# Patient Record
Sex: Male | Born: 1945 | Race: White | Hispanic: No | Smoking: Former smoker
Health system: Southern US, Community
[De-identification: ages and names within clinical notes are randomized; demographics above are authoritative.]

## PROBLEM LIST (undated history)

## (undated) DIAGNOSIS — N400 Enlarged prostate without lower urinary tract symptoms: Secondary | ICD-10-CM

## (undated) DIAGNOSIS — G4730 Sleep apnea, unspecified: Secondary | ICD-10-CM

## (undated) DIAGNOSIS — M549 Dorsalgia, unspecified: Secondary | ICD-10-CM

## (undated) DIAGNOSIS — M1990 Unspecified osteoarthritis, unspecified site: Secondary | ICD-10-CM

## (undated) DIAGNOSIS — I10 Essential (primary) hypertension: Secondary | ICD-10-CM

## (undated) DIAGNOSIS — D649 Anemia, unspecified: Secondary | ICD-10-CM

## (undated) DIAGNOSIS — E119 Type 2 diabetes mellitus without complications: Secondary | ICD-10-CM

## (undated) HISTORY — PX: ROTATOR CUFF REPAIR: SHX139

## (undated) HISTORY — PX: APPENDECTOMY: SHX54

## (undated) HISTORY — PX: BACK SURGERY: SHX140

---

## 2000-10-06 ENCOUNTER — Encounter: Admission: RE | Admit: 2000-10-06 | Discharge: 2001-01-04 | Payer: Self-pay | Admitting: Family Medicine

## 2005-07-22 ENCOUNTER — Encounter: Admission: RE | Admit: 2005-07-22 | Discharge: 2005-07-22 | Payer: Self-pay | Admitting: Otolaryngology

## 2005-07-28 ENCOUNTER — Ambulatory Visit (HOSPITAL_BASED_OUTPATIENT_CLINIC_OR_DEPARTMENT_OTHER): Admission: RE | Admit: 2005-07-28 | Discharge: 2005-07-28 | Payer: Self-pay | Admitting: Otolaryngology

## 2006-04-20 HISTORY — PX: REFRACTIVE SURGERY: SHX103

## 2010-06-06 ENCOUNTER — Ambulatory Visit (HOSPITAL_BASED_OUTPATIENT_CLINIC_OR_DEPARTMENT_OTHER)
Admission: RE | Admit: 2010-06-06 | Discharge: 2010-06-06 | Disposition: A | Discharge status: Home / Self Care | Payer: Medicare Other | Source: Ambulatory Visit | Attending: Orthopedic Surgery | Admitting: Orthopedic Surgery

## 2010-06-06 DIAGNOSIS — M65839 Other synovitis and tenosynovitis, unspecified forearm: Secondary | ICD-10-CM | POA: Insufficient documentation

## 2010-06-10 NOTE — Op Note (Signed)
NAMECLAYBURN, WEEKLY                ACCOUNT NO.:  [id number]  MEDICAL RECORD NO.:  [id number]          PATIENT TYPE:  LOCATION:                                 FACILITY:  PHYSICIAN:  Katy Fitch. Keyan Folson, M.D.      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  OPERATION:  Release of left long finger A1 pulley.  OPERATING SURGEON:  Katy Fitch. Kellianne Ek, MD  ASSISTANT:  Marveen Reeks Dasnoit, PA  ANESTHESIA:  Lidocaine 2% metacarpal head level block and flexor sheath block left long finger.  This was a minor operating room procedure.  INDICATIONS:  Gregory Estrada is a 65 year old gentleman referred through the courtesy of Dr. Arbie Cookey at [facility] for evaluation and management of a chronic locking left long finger.  He had failed prior nonoperative measures.  We discussed a second steroid injection versus release of the A1 pulley.  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  After informed consent, he is brought to the operating room at this time.  PROCEDURE:  Sarp Vernier was brought to room 4 of the [facility] and placed in the supine position upon the operating table. Following Betadine prep of his left hand, 2% lidocaine was infiltrated as a metacarpal head level to obtain a digital block and flexor sheath block.  After few minutes, excellent anesthesia was achieved.  Left arm was then prepped with Betadine soap and solution and sterilely draped. A routine surgical time-out was accomplished followed by exsanguination of the left arm with direct compression followed by  inflation of the arterial tourniquet on the proximal brachium to 250 mmHg.  When anesthesia was confirmed to be satisfactory, we proceeded with an oblique incision directly over the left long finger A1 pulley. Subcutaneous tissues were carefully divided taking care to identify the pulley.  Ragnell retractors were placed to protect the neurovascular structures.  The A1 pulley was split with scalpel and scissors.  After this was fully released, Mr. Bair demonstrated full active range of motion of his finger in flexion and extension.  The wound was then repaired with mattress suture of 5-0 nylon.  Compressive dressing was applied with Xeroflo, sterile gauze, and Ace wrap.  Mr. Parmer will remove his dressing and apply a Band-Aid in 3 days.  We will see him back for follow up in 7 days for suture removal.  Questions invited and answered in detail.  For aftercare, he was provided a prescription for Vicodin 5 mg one p.o. q.4-6 h. p.r.n. pain, 16 tablets without refill.  He will use Aleve or Advil for minor discomfort.     Katy Fitch Dima Mini, M.D.     RVS/MEDQ  D:  06/06/2010  T:  06/07/2010  Job:  [id number]  cc:   Lorin Picket  Gurley.  Electronically Signed by Josephine Igo M.D. on 06/10/2010 08:36:05 AM

## 2010-08-07 ENCOUNTER — Other Ambulatory Visit: Payer: Self-pay | Admitting: Family Medicine

## 2010-08-07 DIAGNOSIS — G459 Transient cerebral ischemic attack, unspecified: Secondary | ICD-10-CM

## 2010-08-07 DIAGNOSIS — G44009 Cluster headache syndrome, unspecified, not intractable: Secondary | ICD-10-CM

## 2010-08-07 DIAGNOSIS — R5383 Other fatigue: Secondary | ICD-10-CM

## 2010-08-07 DIAGNOSIS — R6889 Other general symptoms and signs: Secondary | ICD-10-CM

## 2010-08-14 ENCOUNTER — Ambulatory Visit
Admission: RE | Admit: 2010-08-14 | Discharge: 2010-08-14 | Disposition: A | Discharge status: Home / Self Care | Payer: Medicare Other | Source: Ambulatory Visit | Attending: Family Medicine | Admitting: Family Medicine

## 2010-08-14 ENCOUNTER — Other Ambulatory Visit: Payer: Self-pay | Admitting: Family Medicine

## 2010-08-14 DIAGNOSIS — R5383 Other fatigue: Secondary | ICD-10-CM

## 2010-08-14 DIAGNOSIS — G459 Transient cerebral ischemic attack, unspecified: Secondary | ICD-10-CM

## 2010-08-14 DIAGNOSIS — G44009 Cluster headache syndrome, unspecified, not intractable: Secondary | ICD-10-CM

## 2010-08-14 DIAGNOSIS — R6889 Other general symptoms and signs: Secondary | ICD-10-CM

## 2010-08-14 MED ORDER — GADOBENATE DIMEGLUMINE 529 MG/ML IV SOLN
20.0000 mL | Freq: Once | INTRAVENOUS | Status: AC | PRN
  Administered 2010-08-14: 20 mL via INTRAVENOUS

## 2011-05-21 DIAGNOSIS — H524 Presbyopia: Secondary | ICD-10-CM | POA: Diagnosis not present

## 2011-05-22 DIAGNOSIS — M1710 Unilateral primary osteoarthritis, unspecified knee: Secondary | ICD-10-CM | POA: Diagnosis not present

## 2011-08-05 DIAGNOSIS — R635 Abnormal weight gain: Secondary | ICD-10-CM | POA: Diagnosis not present

## 2011-08-05 DIAGNOSIS — N400 Enlarged prostate without lower urinary tract symptoms: Secondary | ICD-10-CM | POA: Diagnosis not present

## 2011-08-05 DIAGNOSIS — R5383 Other fatigue: Secondary | ICD-10-CM | POA: Diagnosis not present

## 2011-08-05 DIAGNOSIS — I10 Essential (primary) hypertension: Secondary | ICD-10-CM | POA: Diagnosis not present

## 2011-08-05 DIAGNOSIS — M1990 Unspecified osteoarthritis, unspecified site: Secondary | ICD-10-CM | POA: Diagnosis not present

## 2011-08-05 DIAGNOSIS — E119 Type 2 diabetes mellitus without complications: Secondary | ICD-10-CM | POA: Diagnosis not present

## 2011-08-05 DIAGNOSIS — E782 Mixed hyperlipidemia: Secondary | ICD-10-CM | POA: Diagnosis not present

## 2011-08-05 DIAGNOSIS — E538 Deficiency of other specified B group vitamins: Secondary | ICD-10-CM | POA: Diagnosis not present

## 2011-09-11 DIAGNOSIS — H6690 Otitis media, unspecified, unspecified ear: Secondary | ICD-10-CM | POA: Diagnosis not present

## 2011-09-11 DIAGNOSIS — H109 Unspecified conjunctivitis: Secondary | ICD-10-CM | POA: Diagnosis not present

## 2011-09-11 DIAGNOSIS — R21 Rash and other nonspecific skin eruption: Secondary | ICD-10-CM | POA: Diagnosis not present

## 2011-09-11 DIAGNOSIS — M545 Low back pain: Secondary | ICD-10-CM | POA: Diagnosis not present

## 2011-09-11 DIAGNOSIS — J069 Acute upper respiratory infection, unspecified: Secondary | ICD-10-CM | POA: Diagnosis not present

## 2011-09-30 ENCOUNTER — Ambulatory Visit
Admission: RE | Admit: 2011-09-30 | Discharge: 2011-09-30 | Disposition: A | Discharge status: Home / Self Care | Payer: Medicare Other | Source: Ambulatory Visit | Attending: Physician Assistant | Admitting: Physician Assistant

## 2011-09-30 ENCOUNTER — Other Ambulatory Visit: Payer: Self-pay | Admitting: Physician Assistant

## 2011-09-30 DIAGNOSIS — M5137 Other intervertebral disc degeneration, lumbosacral region: Secondary | ICD-10-CM | POA: Diagnosis not present

## 2011-09-30 DIAGNOSIS — M545 Low back pain: Secondary | ICD-10-CM | POA: Diagnosis not present

## 2011-09-30 DIAGNOSIS — M47817 Spondylosis without myelopathy or radiculopathy, lumbosacral region: Secondary | ICD-10-CM | POA: Diagnosis not present

## 2011-09-30 DIAGNOSIS — J309 Allergic rhinitis, unspecified: Secondary | ICD-10-CM | POA: Diagnosis not present

## 2011-10-07 DIAGNOSIS — M545 Low back pain: Secondary | ICD-10-CM | POA: Diagnosis not present

## 2011-10-14 DIAGNOSIS — M545 Low back pain: Secondary | ICD-10-CM | POA: Diagnosis not present

## 2011-10-21 ENCOUNTER — Other Ambulatory Visit: Payer: Self-pay | Admitting: Neurosurgery

## 2011-10-21 DIAGNOSIS — M47817 Spondylosis without myelopathy or radiculopathy, lumbosacral region: Secondary | ICD-10-CM | POA: Diagnosis not present

## 2011-10-21 DIAGNOSIS — M48061 Spinal stenosis, lumbar region without neurogenic claudication: Secondary | ICD-10-CM | POA: Diagnosis not present

## 2011-10-25 ENCOUNTER — Ambulatory Visit
Admission: RE | Admit: 2011-10-25 | Discharge: 2011-10-25 | Disposition: A | Discharge status: Home / Self Care | Payer: Medicare Other | Source: Ambulatory Visit | Attending: Neurosurgery | Admitting: Neurosurgery

## 2011-10-25 DIAGNOSIS — M47817 Spondylosis without myelopathy or radiculopathy, lumbosacral region: Secondary | ICD-10-CM | POA: Diagnosis not present

## 2011-10-25 DIAGNOSIS — M5137 Other intervertebral disc degeneration, lumbosacral region: Secondary | ICD-10-CM | POA: Diagnosis not present

## 2011-10-25 DIAGNOSIS — M5126 Other intervertebral disc displacement, lumbar region: Secondary | ICD-10-CM | POA: Diagnosis not present

## 2011-11-12 DIAGNOSIS — M47817 Spondylosis without myelopathy or radiculopathy, lumbosacral region: Secondary | ICD-10-CM | POA: Diagnosis not present

## 2011-12-09 DIAGNOSIS — M1710 Unilateral primary osteoarthritis, unspecified knee: Secondary | ICD-10-CM | POA: Diagnosis not present

## 2011-12-10 DIAGNOSIS — M47817 Spondylosis without myelopathy or radiculopathy, lumbosacral region: Secondary | ICD-10-CM | POA: Diagnosis not present

## 2011-12-16 DIAGNOSIS — M1710 Unilateral primary osteoarthritis, unspecified knee: Secondary | ICD-10-CM | POA: Diagnosis not present

## 2011-12-23 DIAGNOSIS — M1710 Unilateral primary osteoarthritis, unspecified knee: Secondary | ICD-10-CM | POA: Diagnosis not present

## 2011-12-24 DIAGNOSIS — M1710 Unilateral primary osteoarthritis, unspecified knee: Secondary | ICD-10-CM | POA: Diagnosis not present

## 2011-12-31 DIAGNOSIS — IMO0002 Reserved for concepts with insufficient information to code with codable children: Secondary | ICD-10-CM | POA: Diagnosis not present

## 2011-12-31 DIAGNOSIS — M25559 Pain in unspecified hip: Secondary | ICD-10-CM | POA: Diagnosis not present

## 2011-12-31 DIAGNOSIS — M48061 Spinal stenosis, lumbar region without neurogenic claudication: Secondary | ICD-10-CM | POA: Diagnosis not present

## 2012-01-06 DIAGNOSIS — M48061 Spinal stenosis, lumbar region without neurogenic claudication: Secondary | ICD-10-CM | POA: Diagnosis not present

## 2012-01-06 DIAGNOSIS — IMO0002 Reserved for concepts with insufficient information to code with codable children: Secondary | ICD-10-CM | POA: Diagnosis not present

## 2012-01-20 DIAGNOSIS — M48061 Spinal stenosis, lumbar region without neurogenic claudication: Secondary | ICD-10-CM | POA: Diagnosis not present

## 2012-01-20 DIAGNOSIS — IMO0002 Reserved for concepts with insufficient information to code with codable children: Secondary | ICD-10-CM | POA: Diagnosis not present

## 2012-01-21 DIAGNOSIS — M1710 Unilateral primary osteoarthritis, unspecified knee: Secondary | ICD-10-CM | POA: Diagnosis not present

## 2012-02-02 DIAGNOSIS — Z713 Dietary counseling and surveillance: Secondary | ICD-10-CM | POA: Diagnosis not present

## 2012-02-02 DIAGNOSIS — I10 Essential (primary) hypertension: Secondary | ICD-10-CM | POA: Diagnosis not present

## 2012-02-02 DIAGNOSIS — M1990 Unspecified osteoarthritis, unspecified site: Secondary | ICD-10-CM | POA: Diagnosis not present

## 2012-02-02 DIAGNOSIS — Z23 Encounter for immunization: Secondary | ICD-10-CM | POA: Diagnosis not present

## 2012-02-02 DIAGNOSIS — E782 Mixed hyperlipidemia: Secondary | ICD-10-CM | POA: Diagnosis not present

## 2012-02-02 DIAGNOSIS — M25569 Pain in unspecified knee: Secondary | ICD-10-CM | POA: Diagnosis not present

## 2012-02-02 DIAGNOSIS — M545 Low back pain: Secondary | ICD-10-CM | POA: Diagnosis not present

## 2012-02-04 DIAGNOSIS — M1710 Unilateral primary osteoarthritis, unspecified knee: Secondary | ICD-10-CM | POA: Diagnosis not present

## 2012-02-16 ENCOUNTER — Encounter (HOSPITAL_COMMUNITY): Payer: Self-pay | Admitting: Pharmacy Technician

## 2012-02-16 ENCOUNTER — Other Ambulatory Visit: Payer: Self-pay | Admitting: Orthopedic Surgery

## 2012-02-16 DIAGNOSIS — M1710 Unilateral primary osteoarthritis, unspecified knee: Secondary | ICD-10-CM | POA: Diagnosis not present

## 2012-02-16 MED ORDER — DEXAMETHASONE SODIUM PHOSPHATE 10 MG/ML IJ SOLN: 10.0000 mg | Freq: Once | INTRAMUSCULAR | Status: DC

## 2012-02-16 MED ORDER — BUPIVACAINE 0.25 % ON-Q PUMP SINGLE CATH 300ML: 300.0000 mL | INJECTION | Status: DC

## 2012-02-16 NOTE — Progress Notes (Signed)
Preoperative surgical orders have been place into the Epic hospital system for Gregory Estrada on 02/16/2012, 12:15 PM  by Patrica Duel for surgery on 02/29/12.  Preop Total Knee orders including Bupivacaine On-Q pump, IV Tylenol, and IV Decadron as long as there are no contraindications to the above medications. [name], PA-C

## 2012-02-23 ENCOUNTER — Ambulatory Visit (HOSPITAL_COMMUNITY)
Admission: RE | Admit: 2012-02-23 | Discharge: 2012-02-23 | Disposition: A | Discharge status: Home / Self Care | Payer: Medicare Other | Source: Ambulatory Visit | Attending: Orthopedic Surgery | Admitting: Orthopedic Surgery

## 2012-02-23 ENCOUNTER — Encounter (HOSPITAL_COMMUNITY)
Admission: RE | Admit: 2012-02-23 | Discharge: 2012-02-23 | Disposition: A | Discharge status: Home / Self Care | Payer: Medicare Other | Source: Ambulatory Visit | Attending: Orthopedic Surgery | Admitting: Orthopedic Surgery

## 2012-02-23 ENCOUNTER — Encounter (HOSPITAL_COMMUNITY): Payer: Self-pay

## 2012-02-23 DIAGNOSIS — I10 Essential (primary) hypertension: Secondary | ICD-10-CM | POA: Diagnosis not present

## 2012-02-23 DIAGNOSIS — Z01818 Encounter for other preprocedural examination: Secondary | ICD-10-CM | POA: Diagnosis not present

## 2012-02-23 DIAGNOSIS — E119 Type 2 diabetes mellitus without complications: Secondary | ICD-10-CM | POA: Insufficient documentation

## 2012-02-23 HISTORY — DX: Essential (primary) hypertension: I10

## 2012-02-23 HISTORY — DX: Benign prostatic hyperplasia without lower urinary tract symptoms: N40.0

## 2012-02-23 HISTORY — DX: Sleep apnea, unspecified: G47.30

## 2012-02-23 HISTORY — DX: Dorsalgia, unspecified: M54.9

## 2012-02-23 HISTORY — DX: Unspecified osteoarthritis, unspecified site: M19.90

## 2012-02-23 HISTORY — DX: Type 2 diabetes mellitus without complications: E11.9

## 2012-02-23 HISTORY — DX: Anemia, unspecified: D64.9

## 2012-02-23 LAB — CBC
HCT: 37.1 % — ABNORMAL LOW (ref 39.0–52.0)
Platelets: 149 10*3/uL — ABNORMAL LOW (ref 150–400)
RDW: 14.7 % (ref 11.5–15.5)
WBC: 6.1 10*3/uL (ref 4.0–10.5)

## 2012-02-23 LAB — URINALYSIS, ROUTINE W REFLEX MICROSCOPIC
Bilirubin Urine: NEGATIVE
Glucose, UA: NEGATIVE mg/dL
Hgb urine dipstick: NEGATIVE
Specific Gravity, Urine: 1.025 (ref 1.005–1.030)
pH: 5 (ref 5.0–8.0)

## 2012-02-23 LAB — COMPREHENSIVE METABOLIC PANEL
AST: 19 U/L (ref 0–37)
Albumin: 3.8 g/dL (ref 3.5–5.2)
Alkaline Phosphatase: 79 U/L (ref 39–117)
Chloride: 102 mEq/L (ref 96–112)
Potassium: 4.2 mEq/L (ref 3.5–5.1)
Total Bilirubin: 0.3 mg/dL (ref 0.3–1.2)

## 2012-02-23 LAB — SURGICAL PCR SCREEN
MRSA, PCR: NEGATIVE
Staphylococcus aureus: NEGATIVE

## 2012-02-23 LAB — APTT: aPTT: 30 seconds (ref 24–37)

## 2012-02-23 NOTE — Patient Instructions (Signed)
Gregory Estrada  02/23/2012                           YOUR PROCEDURE IS SCHEDULED ON:  02/29/12 AT 7:15 AM               PLEASE REPORT TO SHORT STAY CENTER AT :  5:15 AM               CALL THIS NUMBER IF ANY PROBLEMS THE DAY OF SURGERY :               [phone]                      REMEMBER:   Do not eat food or drink liquids AFTER MIDNIGHT   Take these medicines the morning of surgery with A SIP OF WATER: NONE   Do not wear jewelry, make-up   Do not wear lotions, powders, or perfumes.   Do not shave legs or underarms 12 hrs. before surgery (men may shave face)  Do not bring valuables to the hospital.  Contacts, dentures or bridgework may not be worn into surgery.  Leave suitcase in the car. After surgery it may be brought to your room.  For patients admitted to the hospital more than one night, checkout time is 11:00                          The day of discharge.   Patients discharged the day of surgery will not be allowed to drive home                             If going home same day of surgery, must have someone stay with you first                           24 hrs at home and arrange for some one to drive you home from hospital.    Special Instructions:   Please read over the following fact sheets that you were given:               1. MRSA  INFORMATION                      2. [place] PREPARING FOR SURGERY SHEET                3. INCENTIVE SPIROMETER               4. BRING C-PAP MASK AND TUBING TO HOSPITAL                                              X_____________________________________________________________________

## 2012-02-28 ENCOUNTER — Other Ambulatory Visit: Payer: Self-pay | Admitting: Orthopedic Surgery

## 2012-02-28 NOTE — H&P (Signed)
Gregory Estrada  DOB: 10/05/1945 Single / Language: English / Race: White Male  Date of Admission:  02/29/12  Chief Complaint:  Left Knee Pain  History of Present Illness The patient is a 66 year old male who comes in for a preoperative History and Physical. The patient is scheduled for a left total knee arthroplasty to be performed by Dr. Frank V. Aluisio, MD at [facility] on 02/29/12. The patient is a 66 year old male who presents today for follow up of their knee. The patient is being followed for their bilateral knee pain and osteoarthritis. Symptoms reported today include: pain and swelling. The patient feels that they are doing poorly and report their pain level to be moderate. Current treatment includes: icing. The following medication has been used for pain control: Tylenol.  He came in for aspirations due to a reaction to Synvisc. He continues to have problems with both knees.  The left knee is far more symptomatic than the right. Knees are now preventing him from doing what he desires. He'd like to be much more active but cannot be because of the knee pain. He is at a stage now where he feels as though something else needs to be done with the knees. He's had cortisone which has provided short-term benefit but not allowed him to do what he wants. He's also had the Visco supplements to which he had a reaction.  He is now ready to proceed with surgery. They have been treated conservatively in the past for the above stated problem and despite conservative measures, they continue to have progressive pain and severe functional limitations and dysfunction. They have failed non-operative management including home exercise, medications, and injections. It is felt that they would benefit from undergoing total joint replacement. Risks and benefits of the procedure have been discussed with the patient and they elect to proceed with surgery. There are no active contraindications  to surgery such as ongoing infection or rapidly progressive neurological disease.  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 02/28/2012 (Marked as Inactive) Flomax *GENITOURINARY AGENTS - MISCELLANEOUS*. Dizziness, Cough. Dry Mouth   Family History Diabetes Mellitus. mother Kidney disease. mother Rheumatoid Arthritis. brother Hypertension. mother   Social History Alcohol use. current drinker; drinks beer and hard liquor; only occasionally per week Copy of Drug/Alcohol Rehab (Previously). no Current work status. working full time Drug/Alcohol Rehab (Currently). no Exercise. Exercises weekly; does individual sport Illicit drug use. no Living situation. live alone Number of flights of stairs before winded. 2-3 Pain Contract. no Children. 2 Tobacco / smoke exposure. yes outdoors only Tobacco use. former smoker; smoke(d) 3/4 pack(s) per day Marital status. divorced Post-Surgical Plans. Plan is to go to SNF. Wants to look into [facility]. Advance Directives. Livig Will, Healthcare POA   Medication History Altace (5MG Capsule, Oral) Active. Janumet XR ( Oral) Specific dose unknown - Active. Lisinopril ( Oral) Specific dose unknown - Active.   Past Surgical History Rotator Cuff Repair - Right. Date: 2007. Back Surgery. Date: 2009. Laminectomy Appendectomy Finger Surgery  Past Medical History Anemia Sleep Apnea Gastroesophageal Reflux Disease Diabetes Mellitus, Type II Shingles Benign Prostatic Hypertrophy Obesity B12 Deficiency Hypertension Colonic Polyps   Review of Systems General:Not Present- Chills, Fever, Night Sweats, Fatigue, Weight Gain, Weight Loss and Memory Loss. Skin:Not Present- Hives, Itching, Rash, Eczema and Lesions. HEENT:Not Present- Tinnitus, Headache, Double Vision, Visual Loss, Hearing Loss and Dentures. Respiratory:Not Present- Shortness of    breath with exertion, Shortness of breath at rest, Allergies, Coughing up blood and Chronic Cough. Cardiovascular:Not Present- Chest Pain, Racing/skipping heartbeats, Difficulty Breathing Lying Down, Murmur, Swelling and Palpitations. Gastrointestinal:Not Present- Bloody Stool, Heartburn, Abdominal Pain, Vomiting, Nausea, Constipation, Diarrhea, Difficulty Swallowing, Jaundice and Loss of appetitie. Male Genitourinary:Not Present- Urinary frequency, Blood in Urine, Weak urinary stream, Discharge, Flank Pain, Incontinence, Painful Urination, Urgency, Urinary Retention and Urinating at Night. Musculoskeletal:Present- Joint Swelling and Joint Pain. Not Present- Muscle Weakness, Muscle Pain, Back Pain, Morning Stiffness and Spasms. Neurological:Not Present- Tremor, Dizziness, Blackout spells, Paralysis, Difficulty with balance and Weakness. Psychiatric:Not Present- Insomnia.   Vitals Weight: 308 lb Height: 72 in Body Surface Area: 2.66 m Body Mass Index: 41.77 kg/m Pulse: 72 (Regular) Resp.: 14 (Unlabored) BP: 154/66 (Sitting, Left Arm, Standard)    Physical Exam The physical exam findings are as follows:   General Mental Status - Alert, cooperative and good historian. General Appearance- pleasant. Not in acute distress. Orientation- Oriented X3. Build & Nutrition- Well nourished and Well developed.   Head and Neck Head- normocephalic, atraumatic . Neck Global Assessment- supple. no bruit auscultated on the right and no bruit auscultated on the left.   Eye Pupil- Bilateral- Regular and Round. Motion- Bilateral- EOMI. wears glasses  Chest and Lung Exam Auscultation: Breath sounds:- clear at anterior chest wall and - clear at posterior chest wall. Adventitious sounds:- No Adventitious sounds.   Cardiovascular Auscultation:Rhythm- Regular rate and rhythm. Heart Sounds- S1 WNL and S2 WNL. Murmurs & Other Heart Sounds: Murmur  1:Location- Aortic Area and Pulmonic Area. Timing- Early systolic. Grade- II/VI.   Abdomen Inspection:Contour- Generalized moderate distention. Palpation/Percussion:Tenderness- Abdomen is non-tender to palpation. Rigidity (guarding)- Abdomen is soft. Auscultation:Auscultation of the abdomen reveals - Bowel sounds normal.   Male Genitourinary Not done, not pertinent to present illness  Musculoskeletal On exam, well-developed male, alert and oriented, in no apparent distress. Evaluation of his knees shows no effusions. The left knee range is about 5-120. There is marked crepitus on ROM. He's tender medially greater than laterally with no instability. Right knee range is about 5-125. Slight crepitus on ROM. Minimal tenderness. No instability.  RADIOGRAPHS: AP of both knees and lateral show bone on bone changes in the medial and patellofemoral compartments of both knees, a little worse on the left than the right.  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 Aluisio.  Plan is to look into SNF - Wants to look into [facility].  PCP - Dr. Gurley - Patient has been seen preoperatively and felt to be stable for surgery. Recommend Hospitalists for DM management postop if needed.   Signed electronically by DREW L Zeshan Sena, PA-C  

## 2012-02-29 ENCOUNTER — Encounter (HOSPITAL_COMMUNITY): Payer: Self-pay | Admitting: *Deleted

## 2012-02-29 ENCOUNTER — Inpatient Hospital Stay (HOSPITAL_COMMUNITY)
Admission: RE | Admit: 2012-02-29 | Discharge: 2012-03-03 | DRG: 470 | Disposition: A | Discharge status: Skilled Nursing Facility | Payer: Medicare Other | Source: Ambulatory Visit | Attending: Orthopedic Surgery | Admitting: Orthopedic Surgery

## 2012-02-29 ENCOUNTER — Encounter (HOSPITAL_COMMUNITY)
Admission: RE | Disposition: A | Discharge status: Skilled Nursing Facility | Payer: Self-pay | Source: Ambulatory Visit | Attending: Orthopedic Surgery

## 2012-02-29 ENCOUNTER — Inpatient Hospital Stay (HOSPITAL_COMMUNITY): Payer: Medicare Other | Admitting: *Deleted

## 2012-02-29 DIAGNOSIS — M1710 Unilateral primary osteoarthritis, unspecified knee: Secondary | ICD-10-CM | POA: Diagnosis not present

## 2012-02-29 DIAGNOSIS — E871 Hypo-osmolality and hyponatremia: Secondary | ICD-10-CM

## 2012-02-29 DIAGNOSIS — Z5189 Encounter for other specified aftercare: Secondary | ICD-10-CM | POA: Diagnosis not present

## 2012-02-29 DIAGNOSIS — D62 Acute posthemorrhagic anemia: Secondary | ICD-10-CM

## 2012-02-29 DIAGNOSIS — G4733 Obstructive sleep apnea (adult) (pediatric): Secondary | ICD-10-CM | POA: Diagnosis not present

## 2012-02-29 DIAGNOSIS — I10 Essential (primary) hypertension: Secondary | ICD-10-CM | POA: Diagnosis present

## 2012-02-29 DIAGNOSIS — IMO0002 Reserved for concepts with insufficient information to code with codable children: Secondary | ICD-10-CM | POA: Diagnosis not present

## 2012-02-29 DIAGNOSIS — Z96659 Presence of unspecified artificial knee joint: Secondary | ICD-10-CM

## 2012-02-29 DIAGNOSIS — M179 Osteoarthritis of knee, unspecified: Secondary | ICD-10-CM

## 2012-02-29 DIAGNOSIS — Z6841 Body Mass Index (BMI) 40.0 and over, adult: Secondary | ICD-10-CM

## 2012-02-29 DIAGNOSIS — E669 Obesity, unspecified: Secondary | ICD-10-CM | POA: Diagnosis present

## 2012-02-29 DIAGNOSIS — E119 Type 2 diabetes mellitus without complications: Secondary | ICD-10-CM | POA: Diagnosis present

## 2012-02-29 DIAGNOSIS — M25569 Pain in unspecified knee: Secondary | ICD-10-CM | POA: Diagnosis not present

## 2012-02-29 DIAGNOSIS — G4730 Sleep apnea, unspecified: Secondary | ICD-10-CM | POA: Diagnosis present

## 2012-02-29 DIAGNOSIS — K5900 Constipation, unspecified: Secondary | ICD-10-CM | POA: Diagnosis not present

## 2012-02-29 HISTORY — PX: TOTAL KNEE ARTHROPLASTY: SHX125

## 2012-02-29 LAB — TYPE AND SCREEN: Antibody Screen: NEGATIVE

## 2012-02-29 LAB — GLUCOSE, CAPILLARY
Glucose-Capillary: 147 mg/dL — ABNORMAL HIGH (ref 70–99)
Glucose-Capillary: 182 mg/dL — ABNORMAL HIGH (ref 70–99)

## 2012-02-29 LAB — ABO/RH: ABO/RH(D): A POS

## 2012-02-29 SURGERY — ARTHROPLASTY, KNEE, TOTAL
Anesthesia: General | Site: Knee | Laterality: Left | Wound class: Clean

## 2012-02-29 MED ORDER — FENTANYL CITRATE 0.05 MG/ML IJ SOLN
INTRAMUSCULAR | Status: DC | PRN
  Administered 2012-02-29 (×2): 100 ug via INTRAVENOUS
  Administered 2012-02-29: 50 ug via INTRAVENOUS

## 2012-02-29 MED ORDER — ONDANSETRON HCL 4 MG PO TABS: 4.0000 mg | ORAL_TABLET | Freq: Four times a day (QID) | ORAL | Status: DC | PRN

## 2012-02-29 MED ORDER — CHLORHEXIDINE GLUCONATE 4 % EX LIQD
60.0000 mL | Freq: Once | CUTANEOUS | Status: DC
  Filled 2012-02-29: qty 60

## 2012-02-29 MED ORDER — HYDROMORPHONE HCL PF 1 MG/ML IJ SOLN
INTRAMUSCULAR | Status: DC | PRN
  Administered 2012-02-29: 1 mg via INTRAVENOUS
  Administered 2012-02-29 (×2): 0.5 mg via INTRAVENOUS

## 2012-02-29 MED ORDER — METHOCARBAMOL 100 MG/ML IJ SOLN
500.0000 mg | Freq: Four times a day (QID) | INTRAVENOUS | Status: DC | PRN
  Administered 2012-02-29: 500 mg via INTRAVENOUS
  Filled 2012-02-29: qty 5

## 2012-02-29 MED ORDER — MIDAZOLAM HCL 5 MG/5ML IJ SOLN
INTRAMUSCULAR | Status: DC | PRN
  Administered 2012-02-29: 2 mg via INTRAVENOUS

## 2012-02-29 MED ORDER — FLEET ENEMA 7-19 GM/118ML RE ENEM: 1.0000 | ENEMA | Freq: Once | RECTAL | Status: AC | PRN

## 2012-02-29 MED ORDER — RIVAROXABAN 10 MG PO TABS
10.0000 mg | ORAL_TABLET | Freq: Every day | ORAL | Status: DC
  Administered 2012-03-01 – 2012-03-03 (×3): 10 mg via ORAL
  Filled 2012-02-29 (×4): qty 1

## 2012-02-29 MED ORDER — PHENOL 1.4 % MT LIQD: 1.0000 | OROMUCOSAL | Status: DC | PRN

## 2012-02-29 MED ORDER — INSULIN ASPART 100 UNIT/ML ~~LOC~~ SOLN
0.0000 [IU] | Freq: Three times a day (TID) | SUBCUTANEOUS | Status: DC
  Administered 2012-02-29: 3 [IU] via SUBCUTANEOUS
  Administered 2012-03-01: 2 [IU] via SUBCUTANEOUS
  Administered 2012-03-01 (×2): 3 [IU] via SUBCUTANEOUS
  Administered 2012-03-02 – 2012-03-03 (×2): 2 [IU] via SUBCUTANEOUS

## 2012-02-29 MED ORDER — HYDROMORPHONE HCL PF 1 MG/ML IJ SOLN
0.2500 mg | INTRAMUSCULAR | Status: DC | PRN
  Administered 2012-02-29 (×2): 0.5 mg via INTRAVENOUS

## 2012-02-29 MED ORDER — METOCLOPRAMIDE HCL 10 MG PO TABS: 5.0000 mg | ORAL_TABLET | Freq: Three times a day (TID) | ORAL | Status: DC | PRN

## 2012-02-29 MED ORDER — LINAGLIPTIN 5 MG PO TABS
5.0000 mg | ORAL_TABLET | Freq: Every evening | ORAL | Status: DC
  Administered 2012-02-29 – 2012-03-02 (×3): 5 mg via ORAL
  Filled 2012-02-29 (×4): qty 1

## 2012-02-29 MED ORDER — DEXTROSE 5 % IV SOLN
3.0000 g | INTRAVENOUS | Status: AC
  Administered 2012-02-29: 3 g via INTRAVENOUS
  Filled 2012-02-29: qty 3000

## 2012-02-29 MED ORDER — SODIUM CHLORIDE 0.9 % IV SOLN: INTRAVENOUS | Status: DC

## 2012-02-29 MED ORDER — MENTHOL 3 MG MT LOZG: 1.0000 | LOZENGE | OROMUCOSAL | Status: DC | PRN

## 2012-02-29 MED ORDER — DEXAMETHASONE 6 MG PO TABS
10.0000 mg | ORAL_TABLET | Freq: Once | ORAL | Status: DC
  Filled 2012-02-29: qty 1

## 2012-02-29 MED ORDER — 0.9 % SODIUM CHLORIDE (POUR BTL) OPTIME
TOPICAL | Status: DC | PRN
  Administered 2012-02-29: 1000 mL

## 2012-02-29 MED ORDER — ONDANSETRON HCL 4 MG/2ML IJ SOLN
INTRAMUSCULAR | Status: DC | PRN
  Administered 2012-02-29: 4 mg via INTRAVENOUS

## 2012-02-29 MED ORDER — LACTATED RINGERS IV SOLN
INTRAVENOUS | Status: DC | PRN
  Administered 2012-02-29 (×2): via INTRAVENOUS

## 2012-02-29 MED ORDER — BUPIVACAINE ON-Q PAIN PUMP (FOR ORDER SET NO CHG)
INJECTION | Status: DC
  Filled 2012-02-29: qty 1

## 2012-02-29 MED ORDER — CEFAZOLIN SODIUM-DEXTROSE 2-3 GM-% IV SOLR
2.0000 g | Freq: Four times a day (QID) | INTRAVENOUS | Status: AC
  Administered 2012-02-29 (×2): 2 g via INTRAVENOUS
  Filled 2012-02-29 (×2): qty 50

## 2012-02-29 MED ORDER — ACETAMINOPHEN 10 MG/ML IV SOLN: 1000.0000 mg | Freq: Once | INTRAVENOUS | Status: DC

## 2012-02-29 MED ORDER — POLYETHYLENE GLYCOL 3350 17 G PO PACK: 17.0000 g | PACK | Freq: Every day | ORAL | Status: DC | PRN

## 2012-02-29 MED ORDER — SODIUM CHLORIDE 0.9 % IV SOLN
INTRAVENOUS | Status: DC
  Administered 2012-02-29: 1000 mL via INTRAVENOUS
  Administered 2012-02-29 – 2012-03-01 (×2): via INTRAVENOUS

## 2012-02-29 MED ORDER — METHOCARBAMOL 500 MG PO TABS
500.0000 mg | ORAL_TABLET | Freq: Four times a day (QID) | ORAL | Status: DC | PRN
  Administered 2012-02-29 – 2012-03-02 (×3): 500 mg via ORAL
  Filled 2012-02-29 (×4): qty 1

## 2012-02-29 MED ORDER — DOCUSATE SODIUM 100 MG PO CAPS
100.0000 mg | ORAL_CAPSULE | Freq: Two times a day (BID) | ORAL | Status: DC
  Administered 2012-02-29 – 2012-03-03 (×6): 100 mg via ORAL

## 2012-02-29 MED ORDER — SODIUM CHLORIDE 0.9 % IR SOLN
Status: DC | PRN
  Administered 2012-02-29: 1000 mL

## 2012-02-29 MED ORDER — TEMAZEPAM 15 MG PO CAPS: 15.0000 mg | ORAL_CAPSULE | Freq: Every evening | ORAL | Status: DC | PRN

## 2012-02-29 MED ORDER — PROPOFOL 10 MG/ML IV BOLUS
INTRAVENOUS | Status: DC | PRN
  Administered 2012-02-29: 200 mg via INTRAVENOUS

## 2012-02-29 MED ORDER — ACETAMINOPHEN 325 MG PO TABS: 650.0000 mg | ORAL_TABLET | Freq: Four times a day (QID) | ORAL | Status: DC | PRN

## 2012-02-29 MED ORDER — ONDANSETRON HCL 4 MG/2ML IJ SOLN
4.0000 mg | Freq: Four times a day (QID) | INTRAMUSCULAR | Status: DC | PRN
  Administered 2012-03-01: 4 mg via INTRAVENOUS
  Filled 2012-02-29: qty 2

## 2012-02-29 MED ORDER — BISACODYL 10 MG RE SUPP: 10.0000 mg | Freq: Every day | RECTAL | Status: DC | PRN

## 2012-02-29 MED ORDER — SAXAGLIPTIN-METFORMIN ER 2.5-1000 MG PO TB24: 2.0000 | ORAL_TABLET | Freq: Every evening | ORAL | Status: DC

## 2012-02-29 MED ORDER — DOXAZOSIN MESYLATE 4 MG PO TABS
4.0000 mg | ORAL_TABLET | Freq: Every evening | ORAL | Status: DC
  Administered 2012-02-29 – 2012-03-02 (×3): 4 mg via ORAL
  Filled 2012-02-29 (×4): qty 1

## 2012-02-29 MED ORDER — TRAMADOL HCL 50 MG PO TABS
50.0000 mg | ORAL_TABLET | Freq: Four times a day (QID) | ORAL | Status: DC | PRN
  Administered 2012-02-29: 100 mg via ORAL
  Filled 2012-02-29: qty 2

## 2012-02-29 MED ORDER — METOCLOPRAMIDE HCL 5 MG/ML IJ SOLN: 5.0000 mg | Freq: Three times a day (TID) | INTRAMUSCULAR | Status: DC | PRN

## 2012-02-29 MED ORDER — DIPHENHYDRAMINE HCL 12.5 MG/5ML PO ELIX: 12.5000 mg | ORAL_SOLUTION | ORAL | Status: DC | PRN

## 2012-02-29 MED ORDER — DEXAMETHASONE SODIUM PHOSPHATE 10 MG/ML IJ SOLN
10.0000 mg | Freq: Once | INTRAMUSCULAR | Status: AC
  Filled 2012-02-29: qty 1

## 2012-02-29 MED ORDER — BUPIVACAINE 0.25 % ON-Q PUMP SINGLE CATH 300ML
INJECTION | Status: DC | PRN
  Administered 2012-02-29: 300 mL

## 2012-02-29 MED ORDER — LIDOCAINE HCL (CARDIAC) 20 MG/ML IV SOLN
INTRAVENOUS | Status: DC | PRN
  Administered 2012-02-29: 50 mg via INTRAVENOUS

## 2012-02-29 MED ORDER — SUCCINYLCHOLINE CHLORIDE 20 MG/ML IJ SOLN
INTRAMUSCULAR | Status: DC | PRN
  Administered 2012-02-29: 180 mg via INTRAVENOUS

## 2012-02-29 MED ORDER — DEXAMETHASONE SODIUM PHOSPHATE 10 MG/ML IJ SOLN
10.0000 mg | Freq: Once | INTRAMUSCULAR | Status: DC
  Filled 2012-02-29: qty 1

## 2012-02-29 MED ORDER — MORPHINE SULFATE 2 MG/ML IJ SOLN
1.0000 mg | INTRAMUSCULAR | Status: DC | PRN
  Administered 2012-02-29 (×5): 2 mg via INTRAVENOUS
  Filled 2012-02-29 (×5): qty 1

## 2012-02-29 MED ORDER — METFORMIN HCL ER 750 MG PO TB24
2000.0000 mg | ORAL_TABLET | Freq: Every evening | ORAL | Status: DC
  Administered 2012-02-29 – 2012-03-02 (×3): 2000 mg via ORAL
  Filled 2012-02-29 (×4): qty 1

## 2012-02-29 MED ORDER — DEXAMETHASONE 4 MG PO TABS
10.0000 mg | ORAL_TABLET | Freq: Once | ORAL | Status: AC
  Administered 2012-03-01: 10 mg via ORAL
  Filled 2012-02-29: qty 2.5

## 2012-02-29 MED ORDER — OXYCODONE HCL 5 MG PO TABS
5.0000 mg | ORAL_TABLET | ORAL | Status: DC | PRN
  Administered 2012-02-29 (×4): 5 mg via ORAL
  Administered 2012-03-01 (×4): 10 mg via ORAL
  Administered 2012-03-01: 5 mg via ORAL
  Administered 2012-03-01 – 2012-03-03 (×9): 10 mg via ORAL
  Filled 2012-02-29 (×3): qty 2
  Filled 2012-02-29: qty 1
  Filled 2012-02-29: qty 2
  Filled 2012-02-29: qty 1
  Filled 2012-02-29 (×6): qty 2
  Filled 2012-02-29: qty 1
  Filled 2012-02-29 (×6): qty 2

## 2012-02-29 MED ORDER — LACTATED RINGERS IV SOLN: INTRAVENOUS | Status: DC

## 2012-02-29 MED ORDER — ACETAMINOPHEN 650 MG RE SUPP: 650.0000 mg | Freq: Four times a day (QID) | RECTAL | Status: DC | PRN

## 2012-02-29 MED ORDER — PROMETHAZINE HCL 25 MG/ML IJ SOLN: 6.2500 mg | INTRAMUSCULAR | Status: DC | PRN

## 2012-02-29 MED ORDER — ACETAMINOPHEN 10 MG/ML IV SOLN
1000.0000 mg | Freq: Four times a day (QID) | INTRAVENOUS | Status: AC
  Administered 2012-02-29 – 2012-03-01 (×4): 1000 mg via INTRAVENOUS
  Filled 2012-02-29 (×6): qty 100

## 2012-02-29 SURGICAL SUPPLY — 54 items
BAG SPEC THK2 15X12 ZIP CLS (MISCELLANEOUS) ×1
BAG ZIPLOCK 12X15 (MISCELLANEOUS) ×2 IMPLANT
BANDAGE ELASTIC 6 VELCRO ST LF (GAUZE/BANDAGES/DRESSINGS) ×2 IMPLANT
BANDAGE ESMARK 6X9 LF (GAUZE/BANDAGES/DRESSINGS) ×1 IMPLANT
BLADE SAG 18X100X1.27 (BLADE) ×2 IMPLANT
BLADE SAW SGTL 11.0X1.19X90.0M (BLADE) ×2 IMPLANT
BNDG CMPR 9X6 STRL LF SNTH (GAUZE/BANDAGES/DRESSINGS) ×1
BNDG ESMARK 6X9 LF (GAUZE/BANDAGES/DRESSINGS) ×2
BOWL SMART MIX CTS (DISPOSABLE) ×2 IMPLANT
CATH KIT ON-Q SILVERSOAK 5IN (CATHETERS) ×2 IMPLANT
CEMENT HV SMART SET (Cement) ×4 IMPLANT
CLOTH BEACON ORANGE TIMEOUT ST (SAFETY) ×2 IMPLANT
CLSR STERI-STRIP ANTIMIC 1/2X4 (GAUZE/BANDAGES/DRESSINGS) ×1 IMPLANT
CUFF TOURN SGL QUICK 34 (TOURNIQUET CUFF) ×2
CUFF TRNQT CYL 34X4X40X1 (TOURNIQUET CUFF) ×1 IMPLANT
DRAPE EXTREMITY T 121X128X90 (DRAPE) ×2 IMPLANT
DRAPE POUCH INSTRU U-SHP 10X18 (DRAPES) ×2 IMPLANT
DRAPE U-SHAPE 47X51 STRL (DRAPES) ×2 IMPLANT
DRSG ADAPTIC 3X8 NADH LF (GAUZE/BANDAGES/DRESSINGS) ×2 IMPLANT
DRSG PAD ABDOMINAL 8X10 ST (GAUZE/BANDAGES/DRESSINGS) ×2 IMPLANT
DURAPREP 26ML APPLICATOR (WOUND CARE) ×2 IMPLANT
ELECT REM PT RETURN 9FT ADLT (ELECTROSURGICAL) ×2
ELECTRODE REM PT RTRN 9FT ADLT (ELECTROSURGICAL) ×1 IMPLANT
EVACUATOR 1/8 PVC DRAIN (DRAIN) ×2 IMPLANT
FACESHIELD LNG OPTICON STERILE (SAFETY) ×10 IMPLANT
GLOVE BIO SURGEON STRL SZ8 (GLOVE) ×2 IMPLANT
GLOVE BIOGEL PI IND STRL 8 (GLOVE) ×2 IMPLANT
GLOVE BIOGEL PI INDICATOR 8 (GLOVE) ×2
GLOVE ECLIPSE 8.0 STRL XLNG CF (GLOVE) ×2 IMPLANT
GLOVE SURG SS PI 6.5 STRL IVOR (GLOVE) ×4 IMPLANT
GOWN STRL NON-REIN LRG LVL3 (GOWN DISPOSABLE) ×4 IMPLANT
GOWN STRL REIN XL XLG (GOWN DISPOSABLE) ×2 IMPLANT
HANDPIECE INTERPULSE COAX TIP (DISPOSABLE) ×2
IMMOBILIZER KNEE 20 (SOFTGOODS) ×2
IMMOBILIZER KNEE 20 THIGH 36 (SOFTGOODS) ×1 IMPLANT
KIT BASIN OR (CUSTOM PROCEDURE TRAY) ×2 IMPLANT
MANIFOLD NEPTUNE II (INSTRUMENTS) ×2 IMPLANT
NS IRRIG 1000ML POUR BTL (IV SOLUTION) ×2 IMPLANT
PACK TOTAL JOINT (CUSTOM PROCEDURE TRAY) ×2 IMPLANT
PAD ABD 7.5X8 STRL (GAUZE/BANDAGES/DRESSINGS) ×2 IMPLANT
PADDING CAST COTTON 6X4 STRL (CAST SUPPLIES) ×2 IMPLANT
POSITIONER SURGICAL ARM (MISCELLANEOUS) ×2 IMPLANT
SET HNDPC FAN SPRY TIP SCT (DISPOSABLE) ×1 IMPLANT
SPONGE GAUZE 4X4 12PLY (GAUZE/BANDAGES/DRESSINGS) ×2 IMPLANT
STRIP CLOSURE SKIN 1/2X4 (GAUZE/BANDAGES/DRESSINGS) ×4 IMPLANT
SUCTION FRAZIER 12FR DISP (SUCTIONS) ×2 IMPLANT
SUT MNCRL AB 4-0 PS2 18 (SUTURE) ×2 IMPLANT
SUT VIC AB 2-0 CT1 27 (SUTURE) ×6
SUT VIC AB 2-0 CT1 TAPERPNT 27 (SUTURE) ×3 IMPLANT
SUT VLOC 180 0 24IN GS25 (SUTURE) ×2 IMPLANT
TOWEL OR 17X26 10 PK STRL BLUE (TOWEL DISPOSABLE) ×4 IMPLANT
TRAY FOLEY CATH 14FRSI W/METER (CATHETERS) ×2 IMPLANT
WATER STERILE IRR 1500ML POUR (IV SOLUTION) ×3 IMPLANT
WRAP KNEE MAXI GEL POST OP (GAUZE/BANDAGES/DRESSINGS) ×4 IMPLANT

## 2012-02-29 NOTE — Transfer of Care (Signed)
Immediate Anesthesia Transfer of Care Note  Patient: Gregory Estrada  Procedure(s) Performed: Procedure(s) (LRB) with comments: TOTAL KNEE ARTHROPLASTY (Left)  Patient Location: PACU  Anesthesia Type:General  Level of Consciousness: awake, alert  and oriented  Airway & Oxygen Therapy: Patient Spontanous Breathing and Patient connected to face mask oxygen  Post-op Assessment: Report given to PACU RN and Post -op Vital signs reviewed and stable  Post vital signs: Reviewed and stable  Complications: No apparent anesthesia complications

## 2012-02-29 NOTE — Progress Notes (Signed)
Orthopedic Tech Progress Note Patient Details:  Gregory Estrada [birth year] [id number]  CPM Left Knee CPM Left Knee: On Left Knee Flexion (Degrees): 20  Left Knee Extension (Degrees): 0  Additional Comments: pt placed on cpm@9 :30 am   [name] 02/29/2012, 9:31 AM

## 2012-02-29 NOTE — Progress Notes (Signed)
Spoke with pt regarding nighttime CPAP, pt has brought home FFM/tubing. Pt doesn't know home settings--placed on auto titrate with oxygen bled in. Pt tolerated but requests to self administer when ready and understands to call for RT if any assistance needed.

## 2012-02-29 NOTE — Interval H&P Note (Signed)
History and Physical Interval Note:  02/29/2012 6:59 AM  Gregory Estrada  has presented today for surgery, with the diagnosis of Osteoarthritis LEFT KNEE  The various methods of treatment have been discussed with the patient and family. After consideration of risks, benefits and other options for treatment, the patient has consented to  Procedure(s) (LRB) with comments: TOTAL KNEE ARTHROPLASTY (Left) as a surgical intervention .  The patient's history has been reviewed, patient examined, no change in status, stable for surgery.  I have reviewed the patient's chart and labs.  Questions were answered to the patient's satisfaction.     Loanne Drilling

## 2012-02-29 NOTE — Progress Notes (Signed)
Clinical Social Work Department BRIEF PSYCHOSOCIAL ASSESSMENT 02/29/2012  Patient:  Gregory Estrada, Gregory Estrada     Account Number:  [id number]     Admit date:  02/29/2012  Clinical Social Worker:  Sofie Hartigan, MSW  Date/Time:  02/29/2012 01:56 PM  Referred by:  Physician  Date Referred:  02/29/2012 Referred for  SNF Placement   Other Referral:   Interview type:  Patient Other interview type:    PSYCHOSOCIAL DATA Living Status:  ALONE Admitted from facility:   Level of care:   Primary support name:  Chinita Pester Primary support relationship to patient:  CHILD, ADULT Degree of support available:   unclear    CURRENT CONCERNS Current Concerns  Post-Acute Placement   Other Concerns:    SOCIAL WORK ASSESSMENT / PLAN Pt is a 66 yr old gentleman living at home prior to hospitalization. CSW met with pt to assist with d/c planning. Pt has made prior arrangements to have ST Rehab at [facility]. SNF contacted and D/C plan has been confirmed. CSW will follow to assist with d/c planning to SNF.   Assessment/plan status:  Psychosocial Support/Ongoing Assessment of Needs Other assessment/ plan:   Information/referral to community resources:   None needed at this time.    PATIENT'S/FAMILY'S RESPONSE TO PLAN OF CARE: Pt is looking forward to feeling better and having rehab at [facility].    Cori Razor LCSW [phone]

## 2012-02-29 NOTE — Anesthesia Preprocedure Evaluation (Signed)
Anesthesia Evaluation  Patient identified by MRN, date of birth, ID band Patient awake    Reviewed: Allergy & Precautions, H&P , NPO status , Patient's Chart, lab work & pertinent test results  Airway Mallampati: III TM Distance: >3 FB Neck ROM: Full    Dental  (+) Teeth Intact and Dental Advisory Given   Pulmonary sleep apnea and Continuous Positive Airway Pressure Ventilation ,  breath sounds clear to auscultation  Pulmonary exam normal       Cardiovascular hypertension, Pt. on medications Rhythm:Regular Rate:Normal     Neuro/Psych negative neurological ROS  negative psych ROS   GI/Hepatic negative GI ROS, Neg liver ROS,   Endo/Other  diabetes, Type 2, Oral Hypoglycemic Agents  Renal/GU negative Renal ROS  negative genitourinary   Musculoskeletal negative musculoskeletal ROS (+)   Abdominal   Peds  Hematology negative hematology ROS (+)   Anesthesia Other Findings   Reproductive/Obstetrics negative OB ROS                           Anesthesia Physical Anesthesia Plan  ASA: III  Anesthesia Plan:    Post-op Pain Management:    Induction: Intravenous  Airway Management Planned: Oral ETT  Additional Equipment:   Intra-op Plan:   Post-operative Plan: Extubation in OR  Informed Consent: I have reviewed the patients History and Physical, chart, labs and discussed the procedure including the risks, benefits and alternatives for the proposed anesthesia with the patient or authorized representative who has indicated his/her understanding and acceptance.   Dental advisory given  Plan Discussed with: CRNA  Anesthesia Plan Comments:         Anesthesia Quick Evaluation

## 2012-02-29 NOTE — Progress Notes (Signed)
Utilization review completed.  

## 2012-02-29 NOTE — Op Note (Signed)
Pre-operative diagnosis- Osteoarthritis  Left knee(s)  Post-operative diagnosis- Osteoarthritis Left knee(s)  Procedure-  Left  Total Knee Arthroplasty  Surgeon- Gregory Estrada. Gregory Challis, MD  Assistant- Gregory Able, PA-C   Anesthesia-  General EBL-* No blood loss amount entered *  Drains Hemovac  Tourniquet time-  Total Tourniquet Time Documented: Thigh (Left) - 36 minutes   Complications- None  Condition-PACU - hemodynamically stable.   Brief Clinical Note   Gregory Estrada is a 66 y.o. year old male with end stage OA of his left knee with progressively worsening pain and dysfunction. He has constant pain, with activity and at rest and significant functional deficits with difficulties even with ADLs. He has had extensive non-op management including analgesics, injections of cortisone and viscosupplements, and home exercise program, but remains in significant pain with significant dysfunction. Radiographs show bone on bone arthritis medial and patellofemoral. He presents now for left Total Knee Arthroplasty.     Procedure in detail---   The patient is brought into the operating room and positioned supine on the operating table. After successful administration of  General,   a tourniquet is placed high on the  Left thigh(s) and the lower extremity is prepped and draped in the usual sterile fashion. Time out is performed by the operating team and then the  Left lower extremity is wrapped in Esmarch, knee flexed and the tourniquet inflated to 300 mmHg.       A midline incision is made with a ten blade through the subcutaneous tissue to the level of the extensor mechanism. A fresh blade is used to make a medial parapatellar arthrotomy. Soft tissue over the proximal medial tibia is subperiosteally elevated to the joint line with a knife and into the semimembranosus bursa with a Cobb elevator. Soft tissue over the proximal lateral tibia is elevated with attention being paid to avoiding the patellar  tendon on the tibial tubercle. The patella is everted, knee flexed 90 degrees and the ACL and PCL are removed. Findings are bone on bone medial and patellofemoral with osteophyte formation and massive synovitis.        The drill is used to create a starting hole in the distal femur and the canal is thoroughly irrigated with sterile saline to remove the fatty contents. The 5 degree Left  valgus alignment guide is placed into the femoral canal and the distal femoral cutting block is pinned to remove 10 mm off the distal femur. Resection is made with an oscillating saw.      The tibia is subluxed forward and the menisci are removed. The extramedullary alignment guide is placed referencing proximally at the medial aspect of the tibial tubercle and distally along the second metatarsal axis and tibial crest. The block is pinned to remove 2mm off the more deficient medial  side. Resection is made with an oscillating saw. Size 4is the most appropriate size for the tibia and the proximal tibia is prepared with the modular drill and keel punch for that size.      The femoral sizing guide is placed and size 5 is most appropriate. Rotation is marked off the epicondylar axis and confirmed by creating a rectangular flexion gap at 90 degrees. The size 5 cutting block is pinned in this rotation and the anterior, posterior and chamfer cuts are made with the oscillating saw. The intercondylar block is then placed and that cut is made.      Trial size 4 tibial component, trial size 5 posterior stabilized femur  and a 12.5  mm posterior stabilized rotating platform insert trial is placed. Full extension is achieved with excellent varus/valgus and anterior/posterior balance throughout full range of motion. The patella is everted and thickness measured to be 27  mm. Free hand resection is taken to 15 mm, a 41 template is placed, lug holes are drilled, trial patella is placed, and it tracks normally. Osteophytes are removed off the  posterior femur with the trial in place. All trials are removed and the cut bone surfaces prepared with pulsatile lavage. Cement is mixed and once ready for implantation, the size 4 tibial implant, size  5 posterior stabilized femoral component, and the size 41 patella are cemented in place and the patella is held with the clamp. The trial insert is placed and the knee held in full extension. All extruded cement is removed and once the cement is hard the permanent 12.5 mm posterior stabilized rotating platform insert is placed into the tibial tray.      The wound is copiously irrigated with saline solution and the extensor mechanism closed over a hemovac drain with #1 PDS suture. The tourniquet is released for a total tourniquet time of 36  minutes. Flexion against gravity is 140 degrees and the patella tracks normally. Subcutaneous tissue is closed with 2.0 vicryl and subcuticular with running 4.0 Monocryl. The catheter for the Marcaine pain pump is placed and the pump is initiated. The incision is cleaned and dried and steri-strips and a bulky sterile dressing are applied. The limb is placed into a knee immobilizer and the patient is awakened and transported to recovery in stable condition.      Please note that a surgical assistant was a medical necessity for this procedure in order to perform it in a safe and expeditious manner. Surgical assistant was necessary to retract the ligaments and vital neurovascular structures to prevent injury to them and also necessary for proper positioning of the limb to allow for anatomic placement of the prosthesis.   Gregory Estrada [name], MD    02/29/2012, 8:15 AM

## 2012-02-29 NOTE — H&P (View-Only) (Signed)
Gregory Estrada  DOB: 1945-04-26 Single / Language: [place] / Race: White Male  Date of Admission:  02/29/12  Chief Complaint:  Left Knee Pain  History of Present Illness The patient is a 66 year old male who comes in for a preoperative History and Physical. The patient is scheduled for a left total knee arthroplasty to be performed by Dr. Gus Rankin. Aluisio, MD at [facility] on 02/29/12. The patient is a 66 year old male who presents today for follow up of their knee. The patient is being followed for their bilateral knee pain and osteoarthritis. Symptoms reported today include: pain and swelling. The patient feels that they are doing poorly and report their pain level to be moderate. Current treatment includes: icing. The following medication has been used for pain control: Tylenol.  He came in for aspirations due to a reaction to Synvisc. He continues to have problems with both knees.  The left knee is far more symptomatic than the right. Knees are now preventing him from doing what he desires. He'd like to be much more active but cannot be because of the knee pain. He is at a stage now where he feels as though something else needs to be done with the knees. He's had cortisone which has provided short-term benefit but not allowed him to do what he wants. He's also had the Visco supplements to which he had a reaction.  He is now ready to proceed with surgery. They have been treated conservatively in the past for the above stated problem and despite conservative measures, they continue to have progressive pain and severe functional limitations and dysfunction. They have failed non-operative management including home exercise, medications, and injections. It is felt that they would benefit from undergoing total joint replacement. Risks and benefits of the procedure have been discussed with the patient and they elect to proceed with surgery. There are no active contraindications  to surgery such as ongoing infection or rapidly progressive neurological disease.  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 02/28/2012 (Marked as Inactive) Flomax *GENITOURINARY AGENTS - MISCELLANEOUS*. Dizziness, Cough. Dry Mouth   Family History Diabetes Mellitus. mother Kidney disease. mother Rheumatoid Arthritis. brother Hypertension. mother   Social History Alcohol use. current drinker; drinks beer and hard liquor; only occasionally per week Copy of Drug/Alcohol Rehab (Previously). no Current work status. working full time Drug/Alcohol Rehab (Currently). no Exercise. Exercises weekly; does individual sport Illicit drug use. no Living situation. live alone Number of flights of stairs before winded. 2-3 Pain Contract. no Children. 2 Tobacco / smoke exposure. yes outdoors only Tobacco use. former smoker; smoke(d) 3/4 pack(s) per day Marital status. divorced Post-Surgical Plans. Plan is to go to SNF. Wants to look into [facility]. Advance Directives. [place] Will, Healthcare POA   Medication History Altace (5MG  Capsule, Oral) Active. Janumet XR ( Oral) Specific dose unknown - Active. Lisinopril ( Oral) Specific dose unknown - Active.   Past Surgical History Rotator Cuff Repair - Right. Date: 2007. Back Surgery. Date: 2009. Laminectomy Appendectomy Finger Surgery  Past Medical History Anemia Sleep Apnea Gastroesophageal Reflux Disease Diabetes Mellitus, Type II Shingles Benign Prostatic Hypertrophy Obesity B12 Deficiency Hypertension Colonic Polyps   Review of Systems General:Not Present- Chills, Fever, Night Sweats, Fatigue, Weight Gain, Weight Loss and Memory Loss. Skin:Not Present- Hives, Itching, Rash, Eczema and Lesions. HEENT:Not Present- Tinnitus, Headache, Double Vision, Visual Loss, Hearing Loss and Dentures. Respiratory:Not Present- Shortness of  breath with exertion, Shortness of breath at rest, Allergies, Coughing up blood and Chronic Cough. Cardiovascular:Not Present- Chest Pain, Racing/skipping heartbeats, Difficulty Breathing Lying Down, Murmur, Swelling and Palpitations. Gastrointestinal:Not Present- Bloody Stool, Heartburn, Abdominal Pain, Vomiting, Nausea, Constipation, Diarrhea, Difficulty Swallowing, Jaundice and Loss of appetitie. Male Genitourinary:Not Present- Urinary frequency, Blood in Urine, Weak urinary stream, Discharge, Flank Pain, Incontinence, Painful Urination, Urgency, Urinary Retention and Urinating at Night. Musculoskeletal:Present- Joint Swelling and Joint Pain. Not Present- Muscle Weakness, Muscle Pain, Back Pain, Morning Stiffness and Spasms. Neurological:Not Present- Tremor, Dizziness, Blackout spells, Paralysis, Difficulty with balance and Weakness. Psychiatric:Not Present- Insomnia.   Vitals Weight: 308 lb Height: 72 in Body Surface Area: 2.66 m Body Mass Index: 41.77 kg/m Pulse: 72 (Regular) Resp.: 14 (Unlabored) BP: 154/66 (Sitting, Left Arm, Standard)    Physical Exam The physical exam findings are as follows:   General Mental Status - Alert, cooperative and good historian. General Appearance- pleasant. Not in acute distress. Orientation- Oriented X3. Build & Nutrition- Well nourished and Well developed.   Head and Neck Head- normocephalic, atraumatic . Neck Global Assessment- supple. no bruit auscultated on the right and no bruit auscultated on the left.   Eye Pupil- Bilateral- Regular and Round. Motion- Bilateral- EOMI. wears glasses  Chest and Lung Exam Auscultation: Breath sounds:- clear at anterior chest wall and - clear at posterior chest wall. Adventitious sounds:- No Adventitious sounds.   Cardiovascular Auscultation:Rhythm- Regular rate and rhythm. Heart Sounds- S1 WNL and S2 WNL. Murmurs & Other Heart Sounds: Murmur  1:Location- Aortic Area and Pulmonic Area. Timing- Early systolic. Grade- II/VI.   Abdomen Inspection:Contour- Generalized moderate distention. Palpation/Percussion:Tenderness- Abdomen is non-tender to palpation. Rigidity (guarding)- Abdomen is soft. Auscultation:Auscultation of the abdomen reveals - Bowel sounds normal.   Male Genitourinary Not done, not pertinent to present illness  Musculoskeletal On exam, well-developed male, alert and oriented, in no apparent distress. Evaluation of his knees shows no effusions. The left knee range is about 5-120. There is marked crepitus on ROM. He's tender medially greater than laterally with no instability. Right knee range is about 5-125. Slight crepitus on ROM. Minimal tenderness. No instability.  RADIOGRAPHS: AP of both knees and lateral show bone on bone changes in the medial and patellofemoral compartments of both knees, a little worse on the left than the right.  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 Lequita Halt.  Plan is to look into SNF - Wants to look into [facility].  PCP - Dr. Janace Litten - Patient has been seen preoperatively and felt to be stable for surgery. Recommend Hospitalists for DM management postop if needed.   Signed electronically by Roberts Gaudy, PA-C

## 2012-02-29 NOTE — Anesthesia Postprocedure Evaluation (Signed)
Anesthesia Post Note  Patient: Gregory Estrada  Procedure(s) Performed: Procedure(s) (LRB): TOTAL KNEE ARTHROPLASTY (Left)  Anesthesia type: General  Patient location: PACU  Post pain: Pain level controlled  Post assessment: Post-op Vital signs reviewed  Last Vitals:  Filed Vitals:   02/29/12 0848  BP: 140/72  Pulse: 79  Temp: 36.8 C  Resp: 14    Post vital signs: Reviewed  Level of consciousness: sedated  Complications: No apparent anesthesia complications

## 2012-02-29 NOTE — Evaluation (Signed)
Physical Therapy Evaluation Patient Details Name: Gregory Estrada MRN: [id number] DOB: October 26, 1945 Today's Date: 02/29/2012 Time: 4098-1191 PT Time Calculation (min): 38 min  PT Assessment / Plan / Recommendation Clinical Impression  Pt presents s/p L TKA POD 0 with decreased strength, ROM and mobility.  Tolerated OOB and some steps to 3in1 then to recliner, however became somewhat nauseated and noted increased diaphoresis (was cold and clammy), therefore assisted pt to recliner.  Pt will benefit from skilled PT in acute venue to address deficits.  PT recommends ST SNF for follow up at D/C to maximize pts independence for eventual safe D/C home.     PT Assessment  Patient needs continued PT services    Follow Up Recommendations  SNF    Does the patient have the potential to tolerate intense rehabilitation      Barriers to Discharge Decreased caregiver support      Equipment Recommendations  Rolling walker with 5" wheels;3 in 1 bedside comode    Recommendations for Other Services OT consult   Frequency 7X/week    Precautions / Restrictions     Pertinent Vitals/Pain 4/10, ice packs applied      Mobility  Bed Mobility Bed Mobility: Supine to Sit Supine to Sit: 4: Min assist;HOB elevated;With rails Details for Bed Mobility Assistance: Assist for LLE out of bed with cues for hand placement on bed to self assist.  Transfers Transfers: Sit to Stand;Stand to Sit;Stand Pivot Transfers Sit to Stand: 1: +2 Total assist;From elevated surface;With upper extremity assist;From bed Sit to Stand: Patient Percentage: 50% Stand to Sit: 1: +2 Total assist;With upper extremity assist;With armrests;To chair/3-in-1 Stand to Sit: Patient Percentage: 50% Stand Pivot Transfers: 1: +2 Total assist Stand Pivot Transfers: Patient Percentage: 60% Details for Transfer Assistance: Performed x 2 in order to use 3in1.  Assist to rise and stabalize with cues for hand placement, LE management, safety and  controlled descent when sitting.  Requires some assist with LLE when sitting. Able to take some steps from bed to 3in1 then to recliner with cues for sequencing/technique with RW and to maintain upright posture.      Shoulder Instructions     Exercises     PT Diagnosis: Difficulty walking;Generalized weakness;Acute pain  PT Problem List: Decreased strength;Decreased range of motion;Decreased activity tolerance;Decreased balance;Decreased mobility;Decreased knowledge of use of DME;Decreased safety awareness;Pain;Decreased knowledge of precautions;Obesity PT Treatment Interventions: DME instruction;Gait training;Functional mobility training;Therapeutic activities;Therapeutic exercise;Balance training;Patient/family education   PT Goals Acute Rehab PT Goals PT Goal Formulation: With patient Time For Goal Achievement: 03/07/12 Potential to Achieve Goals: Good Pt will go Supine/Side to Sit: with supervision PT Goal: Supine/Side to Sit - Progress: Goal set today Pt will go Sit to Supine/Side: with supervision PT Goal: Sit to Supine/Side - Progress: Goal set today Pt will go Sit to Stand: with supervision PT Goal: Sit to Stand - Progress: Goal set today Pt will Ambulate: 51 - 150 feet;with supervision;with least restrictive assistive device PT Goal: Ambulate - Progress: Goal set today Pt will Perform Home Exercise Program: with supervision, verbal cues required/provided PT Goal: Perform Home Exercise Program - Progress: Goal set today  Visit Information  Last PT Received On: 02/29/12 Assistance Needed: +2 (safety)    Subjective Data  Subjective: Lets get this done Patient Stated Goal: to get better   Prior Functioning  Home Living Lives With: Alone Type of Home: House Home Access: Ramped entrance Bathroom Shower/Tub: Health visitor:  (somewhat elevated) Home Adaptive Equipment: Built-in  shower seat Prior Function Level of Independence: Independent Driving:  Yes Communication Communication: No difficulties    Cognition  Overall Cognitive Status: Appears within functional limits for tasks assessed/performed Arousal/Alertness: Awake/alert Orientation Level: Appears intact for tasks assessed Behavior During Session: [facility] for tasks performed    Extremity/Trunk Assessment Right Lower Extremity Assessment RLE ROM/Strength/Tone: [facility] for tasks assessed RLE Sensation: WFL - Light Touch RLE Coordination: WFL - gross/fine motor Left Lower Extremity Assessment LLE ROM/Strength/Tone: Deficits LLE ROM/Strength/Tone Deficits: ankle motions WFL, able to somewhat initiate SLR, however requires assist against gravity.  LLE Sensation: WFL - Light Touch Trunk Assessment Trunk Assessment: Normal   Balance Balance Balance Assessed: Yes Static Sitting Balance Static Sitting - Balance Support: Bilateral upper extremity supported;Feet supported Static Sitting - Level of Assistance: 5: Stand by assistance Static Sitting - Comment/# of Minutes: Pt sat on 3in1 approx 6-8 mins.  Upon last couple of mins, PT noted that pt looks diaphoretic and was sweating.  Assited pt to recliner.   End of Session PT - End of Session Equipment Utilized During Treatment: Gait belt;Left knee immobilizer Activity Tolerance: Patient limited by fatigue;Other (comment) (nausea) Patient left: in chair;with call bell/phone within reach;with family/visitor present Nurse Communication: Mobility status CPM Left Knee CPM Left Knee: Off  GP     Page, Meribeth Mattes 02/29/2012, 5:15 PM

## 2012-02-29 NOTE — Progress Notes (Signed)
Clinical Social Work Department CLINICAL SOCIAL WORK PLACEMENT NOTE 02/29/2012  Patient:  Gregory Estrada, Gregory Estrada  Account Number:  [id number] Admit date:  02/29/2012  Clinical Social Worker:  Cori Razor, LCSW  Date/time:  02/29/2012 12:00 M  Clinical Social Work is seeking post-discharge placement for this patient at the following level of care:   SKILLED NURSING   (*CSW will update this form in Epic as items are completed)     Patient/family provided with [facility] Department of Clinical Social Work's list of facilities offering this level of care within the geographic area requested by the patient (or if unable, by the patient's family).    Patient/family informed of their freedom to choose among providers that offer the needed level of care, that participate in Medicare, Medicaid or managed care program needed by the patient, have an available bed and are willing to accept the patient.    Patient/family informed of [facility]' ownership interest in [facility], as well as of the fact that they are under no obligation to receive care at this facility.  PASARR submitted to EDS on  PASARR number received from EDS on   FL2 transmitted to all facilities in geographic area requested by pt/family on  02/29/2012 FL2 transmitted to all facilities within larger geographic area on   Patient informed that his/her managed care company has contracts with or will negotiate with  certain facilities, including the following:     Patient/family informed of bed offers received:  02/29/2012 Patient chooses bed at [facility] Physician recommends and patient chooses bed at    Patient to be transferred to  on   Patient to be transferred to facility by   The following physician request were entered in Epic:   Additional Comments: Cori Razor LCSW [phone]

## 2012-03-01 DIAGNOSIS — D62 Acute posthemorrhagic anemia: Secondary | ICD-10-CM

## 2012-03-01 DIAGNOSIS — E871 Hypo-osmolality and hyponatremia: Secondary | ICD-10-CM

## 2012-03-01 LAB — CBC
HCT: 29.3 % — ABNORMAL LOW (ref 39.0–52.0)
Hemoglobin: 9.3 g/dL — ABNORMAL LOW (ref 13.0–17.0)
MCH: 26.8 pg (ref 26.0–34.0)
MCV: 84.4 fL (ref 78.0–100.0)
Platelets: 118 10*3/uL — ABNORMAL LOW (ref 150–400)
RBC: 3.47 MIL/uL — ABNORMAL LOW (ref 4.22–5.81)
WBC: 5.8 10*3/uL (ref 4.0–10.5)

## 2012-03-01 LAB — BASIC METABOLIC PANEL
CO2: 27 mEq/L (ref 19–32)
Chloride: 101 mEq/L (ref 96–112)
Creatinine, Ser: 0.92 mg/dL (ref 0.50–1.35)
Glucose, Bld: 124 mg/dL — ABNORMAL HIGH (ref 70–99)

## 2012-03-01 LAB — GLUCOSE, CAPILLARY
Glucose-Capillary: 133 mg/dL — ABNORMAL HIGH (ref 70–99)
Glucose-Capillary: 163 mg/dL — ABNORMAL HIGH (ref 70–99)
Glucose-Capillary: 174 mg/dL — ABNORMAL HIGH (ref 70–99)

## 2012-03-01 MED ORDER — POLYSACCHARIDE IRON COMPLEX 150 MG PO CAPS
150.0000 mg | ORAL_CAPSULE | Freq: Every day | ORAL | Status: DC
  Administered 2012-03-02 – 2012-03-03 (×2): 150 mg via ORAL
  Filled 2012-03-01 (×3): qty 1

## 2012-03-01 NOTE — Progress Notes (Signed)
Physical Therapy Treatment Patient Details Name: Gregory Estrada MRN: [id number] DOB: 05-17-1945 Today's Date: 03/01/2012 Time: 9528-4132 PT Time Calculation (min): 23 min  PT Assessment / Plan / Recommendation Comments on Treatment Session  Pt progressing with mobility, however states that he feels weak and got somewhat nauseated with ambulation into hallway.      Follow Up Recommendations  SNF     Does the patient have the potential to tolerate intense rehabilitation     Barriers to Discharge        Equipment Recommendations  Rolling walker with 5" wheels;3 in 1 bedside comode    Recommendations for Other Services    Frequency 7X/week   Plan Discharge plan remains appropriate    Precautions / Restrictions Precautions Precautions: Knee Required Braces or Orthoses: Knee Immobilizer - Left Knee Immobilizer - Left: Discontinue once straight leg raise with < 10 degree lag Restrictions Weight Bearing Restrictions: No Other Position/Activity Restrictions: WBAT   Pertinent Vitals/Pain 5/10, ice packs applied.     Mobility  Bed Mobility Bed Mobility: Supine to Sit Supine to Sit: 1: +2 Total assist;With rails;HOB elevated Supine to Sit: Patient Percentage: 60% Details for Bed Mobility Assistance: Assist for LLE off EOB with cues for hand placement, safety and technique.  Some assist for trunk as pt was sitting up.  Transfers Transfers: Sit to Stand;Stand to Sit;Stand Pivot Transfers Sit to Stand: 1: +2 Total assist;With upper extremity assist;From bed Sit to Stand: Patient Percentage: 60% Stand to Sit: 1: +2 Total assist;With upper extremity assist;To chair/3-in-1 Stand to Sit: Patient Percentage: 60% Details for Transfer Assistance: Assist to rise and stabalize with cues for hand placement, LE management and safety.  Ambulation/Gait Ambulation/Gait Assistance: 1: +2 Total assist Ambulation/Gait: Patient Percentage: 70% Ambulation Distance (Feet): 20 Feet Assistive device:  Rolling walker Ambulation/Gait Assistance Details: Assist to steady througout with cues for sequencing/technique with RW and to maintain upright posture.   Gait Pattern: Step-to pattern;Decreased stride length;Antalgic    Exercises     PT Diagnosis:    PT Problem List:   PT Treatment Interventions:     PT Goals Acute Rehab PT Goals PT Goal Formulation: With patient Time For Goal Achievement: 03/07/12 Potential to Achieve Goals: Good Pt will go Supine/Side to Sit: with supervision PT Goal: Supine/Side to Sit - Progress: Progressing toward goal Pt will go Sit to Stand: with supervision PT Goal: Sit to Stand - Progress: Progressing toward goal Pt will Ambulate: 51 - 150 feet;with supervision;with least restrictive assistive device PT Goal: Ambulate - Progress: Progressing toward goal  Visit Information  Last PT Received On: 03/01/12 Assistance Needed: +2    Subjective Data  Subjective: I was nauseous sitting up earlier trying to eat breakfast.  Patient Stated Goal: to get better   Cognition  Overall Cognitive Status: Appears within functional limits for tasks assessed/performed Arousal/Alertness: Awake/alert Orientation Level: Appears intact for tasks assessed Behavior During Session: [facility] for tasks performed    Balance     End of Session PT - End of Session Equipment Utilized During Treatment: Left knee immobilizer Activity Tolerance: Patient limited by fatigue;Other (comment) Patient left: in chair;with call bell/phone within reach Nurse Communication: Mobility status CPM Left Knee CPM Left Knee: Off   GP     Page, Meribeth Mattes 03/01/2012, 10:30 AM

## 2012-03-01 NOTE — Care Management Note (Addendum)
    Page 1 of 2   03/02/2012     5:53:39 PM   CARE MANAGEMENT NOTE 03/02/2012  Patient:  KINZER, HORNEY   Account Number:  [id number]  Date Initiated:  03/01/2012  Documentation initiated by:  Colleen Can  Subjective/Objective Assessment:   dx total knee replacement     Action/Plan:   Pt made plans for SNF rehab prior to admission for surgery   Anticipated DC Date:  03/03/2012   Anticipated DC Plan:  SKILLED NURSING FACILITY  In-house referral  Clinical Social Worker      DC Planning Services  CM consult      [facility] Choice  NA   Choice offered to / List presented to:  NA   DME arranged  NA      DME agency  NA     HH arranged  NA      HH agency  NA   Status of service:  Completed, signed off Medicare Important Message given?  NA - LOS <3 / Initial given by admissions (If response is "NO", the following Medicare IM given date fields will be blank) Date Medicare IM given:   Date Additional Medicare IM given:    Discharge Disposition:    Per UR Regulation:    If discussed at Long Length of Stay Meetings, dates discussed:    Comments:

## 2012-03-01 NOTE — Evaluation (Signed)
Occupational Therapy Evaluation Patient Details Name: DORSEL MONTEIRO MRN: [id number] DOB: 1945-08-15 Today's Date: 03/01/2012 Time: 4696-2952 OT Time Calculation (min): 19 min  OT Assessment / Plan / Recommendation Clinical Impression  Pt is s/p L TKA and displays decreased activity tolerance, increased pain, some nausea and overall decreased independence with ADL. Will benefit from skilled OT services to improve ADL independence for next venue of care.     OT Assessment  Patient needs continued OT Services    Follow Up Recommendations  SNF    Barriers to Discharge      Equipment Recommendations  Rolling walker with 5" wheels;3 in 1 bedside comode    Recommendations for Other Services    Frequency  Min 2X/week    Precautions / Restrictions Precautions Precautions: Knee Required Braces or Orthoses: Knee Immobilizer - Left Restrictions Weight Bearing Restrictions: No Other Position/Activity Restrictions: WBAT        ADL  Eating/Feeding: Simulated;Independent Where Assessed - Eating/Feeding: Bed level Grooming: Simulated;Wash/dry hands;Set up Where Assessed - Grooming: Supported sitting Upper Body Bathing: Simulated;Chest;Right arm;Left arm;Abdomen;Supervision/safety;Set up;Other (comment) (some nausea at EOB) Where Assessed - Upper Body Bathing: Unsupported sitting Lower Body Bathing: Simulated;+2 Total assistance Lower Body Bathing: Patient Percentage: 50% Where Assessed - Lower Body Bathing: Supported sit to stand Upper Body Dressing: Simulated;Supervision/safety;Set up Where Assessed - Upper Body Dressing: Unsupported sitting Lower Body Dressing: Simulated;+2 Total assistance Lower Body Dressing: Patient Percentage: 50% Where Assessed - Lower Body Dressing: Supported sit to stand Toilet Transfer: Simulated;+2 Total assistance Toilet Transfer: Patient Percentage: 60% Toilet Transfer Method: Sit to stand;Other (comment) (+2 total 60% to stand, 70% for functional  mobility to just outside door) Toileting - Architect and Hygiene: Simulated;+2 Total assistance Toileting - Clothing Manipulation and Hygiene: Patient Percentage: 60% Where Assessed - Toileting Clothing Manipulation and Hygiene: Sit to stand from 3-in-1 or toilet Tub/Shower Transfer Method: Not assessed Equipment Used: Rolling walker ADL Comments: Pt feeling "warm" and states having some nausea with activity. Cotx with PT to ambulate just outside doorway and then chair pulled up. Assessed LB ADL from chair after ambulation.     OT Diagnosis: Generalized weakness  OT Problem List: Decreased strength;Decreased range of motion;Decreased knowledge of use of DME or AE;Decreased activity tolerance OT Treatment Interventions: Self-care/ADL training;Therapeutic activities;DME and/or AE instruction;Patient/family education   OT Goals Acute Rehab OT Goals OT Goal Formulation: With patient Time For Goal Achievement: 03/08/12 Potential to Achieve Goals: Good ADL Goals Pt Will Perform Grooming: with supervision;Standing at sink ADL Goal: Grooming - Progress: Goal set today Pt Will Perform Lower Body Bathing: with min assist;Sit to stand from chair;Sit to stand from bed;with adaptive equipment ADL Goal: Lower Body Bathing - Progress: Goal set today Pt Will Perform Lower Body Dressing: with min assist;Sit to stand from chair;Sit to stand from bed;with adaptive equipment ADL Goal: Lower Body Dressing - Progress: Goal set today Pt Will Transfer to Toilet: with min assist;Ambulation;Extra wide 3-in-1 ADL Goal: Toilet Transfer - Progress: Goal set today Pt Will Perform Toileting - Clothing Manipulation: with supervision;Standing ADL Goal: Toileting - Clothing Manipulation - Progress: Goal set today  Visit Information  Last OT Received On: 03/01/12 Assistance Needed: +2 PT/OT Co-Evaluation/Treatment: Yes    Subjective Data  Subjective: I am just so weak Patient Stated Goal: wants to  regain strength   Prior Functioning     Home Living Lives With: Alone Type of Home: House Home Access: Ramped entrance Bathroom Shower/Tub: Walk-in shower Bathroom Toilet: Handicapped height (somewhat  elevated) Home Adaptive Equipment: Built-in shower seat Prior Function Level of Independence: Independent Driving: Yes Communication Communication: No difficulties         Vision/Perception     Cognition  Overall Cognitive Status: Appears within functional limits for tasks assessed/performed Arousal/Alertness: Awake/alert Orientation Level: Appears intact for tasks assessed Behavior During Session: [facility] for tasks performed    Extremity/Trunk Assessment Right Upper Extremity Assessment RUE ROM/Strength/Tone: [facility] for tasks assessed Left Upper Extremity Assessment LUE ROM/Strength/Tone: WFL for tasks assessed     Mobility Bed Mobility Bed Mobility: Supine to Sit Supine to Sit: 1: +2 Total assist;With rails;HOB elevated Supine to Sit: Patient Percentage: 60% Details for Bed Mobility Assistance: assist for L LE over to EOB, verbal cues for technique and hand placement Some assist for trunk to upright after reaching for rail. Transfers Transfers: Sit to Stand;Stand to Sit Sit to Stand: 1: +2 Total assist;With upper extremity assist;From bed Sit to Stand: Patient Percentage: 60% Stand to Sit: 1: +2 Total assist;With upper extremity assist;To chair/3-in-1 Stand to Sit: Patient Percentage: 60% Details for Transfer Assistance: verbal cues for hand placement and L LE placement. Needs assist to rise and stabilize and to control descent. Pt did well with sequencing RW today.      Shoulder Instructions     Exercise     Balance     End of Session OT - End of Session Activity Tolerance: Patient limited by fatigue Patient left: in chair;with call bell/phone within reach  GO     [name] [phone] 03/01/2012, 9:56 AM

## 2012-03-01 NOTE — Clinical Documentation Improvement (Signed)
BMI DOCUMENTATION CLARIFICATION QUERY  THIS DOCUMENT IS NOT A PERMANENT PART OF THE MEDICAL RECORD  TO RESPOND TO THE THIS QUERY, FOLLOW THE INSTRUCTIONS BELOW:  1. If needed, update documentation for the patient's encounter via the notes activity.  2. Access this query again and click edit on the In [employer].  3. After updating, or not, click F2 to complete all highlighted (required) fields concerning your review. Select "additional documentation in the medical record" OR "no additional documentation provided".  4. Click Sign note button.  5. The deficiency will fall out of your In Basket *Please let us know if you are not able to complete this workflow by phone or e-mail (listed below).         03/01/12  Dear Gregory Estrada  In an effort to better capture your patient's severity of illness, reflect appropriate length of stay and utilization of resources, a review of the patient medical record has revealed the following indicators.    Based on your clinical judgment, please clarify and document in a progress note and/or discharge summary the clinical condition associated with the following supporting information:  In responding to this query please exercise your independent judgment.  The fact that a query is asked, does not imply that any particular answer is desired or expected.  Based on your clinical judgment can you provide a diagnosis that represents the below listed clinical indicators?  In this patient admitted with OA requiring a TOTAL KNEE ARTHROPLASTY a review of the medical record reveals the following:   Obesity  BMI= 42.04  HT=6'  WT=310 lbs  Treatment:  Carb Modified  Please clarify whether or not BMI can be linked to one of the diagnoses listed below and document in pn  and d/c. Thank You!  [facility]ACT[facility]: When linking BMI to a diagnosis please document both BMI and diagnosis together in pn for accuracy of severity of illness (SOI) and risk  of mortality (ROM).   Possible Clinical conditions  Morbid Obesity W/ BMI=   Underweight w/BMI=  Other condition___________________  Cannot Clinically determine _____________  Risk Factors: Sign & Symptoms: See above Diagnostics: Lab:  Treatment See above:    Reviewed: additional documentation in the medical record in the DC Summary  Thank You,  Philbert Riser J Henley  Lolita J. Ambrose Mantle RN, BSN, MSN/Inf, CCDS Clinical Documentation Specialist [facility] Dept Pager: [phone]  Health Information Management [place]

## 2012-03-01 NOTE — Progress Notes (Signed)
   Subjective: 1 Day Post-Op Procedure(s) (LRB): TOTAL KNEE ARTHROPLASTY (Left) Patient reports pain as mild.   Patient seen in rounds with Dr. Lequita Halt. Friend visiting at time of rounds. Patient sitting on side of bed eating breakfast. Patient is well, and has had no acute complaints or problems We will start therapy today.  Plan is to go Skilled nursing facility after hospital stay.  Objective: Vital signs in last 24 hours: Temp:  [97.5 F (36.4 C)-98.3 F (36.8 C)] 97.9 F (36.6 C) (11/12 0523) Pulse Rate:  [71-83] 75  (11/12 0523) Resp:  [10-17] 14  (11/12 0523) BP: (106-144)/(65-75) 116/65 mmHg (11/12 0523) SpO2:  [93 %-100 %] 93 % (11/12 0523) Weight:  [140.615 kg (310 lb)] 140.615 kg (310 lb) (11/11 1000)  Intake/Output from previous day:  Intake/Output Summary (Last 24 hours) at 03/01/12 0810 Last data filed at 03/01/12 0600  Gross per 24 hour  Intake   3760 ml  Output   3605 ml  Net    155 ml    Intake/Output this shift: UOP 1525 +1155  Labs:  [facility] 03/01/12 0454  HGB 9.3*    Basename 03/01/12 0454  WBC 5.8  RBC 3.47*  HCT 29.3*  PLT 118*    Basename 03/01/12 0454  NA 134*  K 3.9  CL 101  CO2 27  BUN 12  CREATININE 0.92  GLUCOSE 124*  CALCIUM 8.3*   No results found for this basename: LABPT:2,INR:2 in the last 72 hours  EXAM General - Patient is Alert, Appropriate and Oriented Extremity - Neurovascular intact Sensation intact distally Dorsiflexion/Plantar flexion intact Dressing - dressing C/D/I Motor Function - intact, moving foot and toes well on exam.  Hemovac pulled without difficulty.  Past Medical History  Diagnosis Date  . Back pain   . Hypertension   . Arthritis   . Prostate enlargement   . Anemia     B 12 DEFICIENCY  . Diabetes mellitus without complication   . Sleep apnea     USES C-PAP    Assessment/Plan: 1 Day Post-Op Procedure(s) (LRB): TOTAL KNEE ARTHROPLASTY (Left) Principal Problem:  *OA (osteoarthritis)  of knee Active Problems:  Postop Acute blood loss anemia  Hyponatremia  Estimated Body mass index is 42.04 kg/(m^2) as calculated from the following:   Height as of this encounter: 6\' 0" (1.829 m).   Weight as of this encounter: 310 lb(140.615 kg). Advance diet Up with therapy Discharge to SNF - Wants to look into [facility].  DVT Prophylaxis - Xarelto Weight-Bearing as tolerated to left leg No vaccines. D/C O2 and Pulse OX and try on Room Air  Gregory Estrada 03/01/2012, 8:10 AM

## 2012-03-01 NOTE — Progress Notes (Signed)
Physical Therapy Treatment Patient Details Name: Gregory Estrada MRN: [id number] DOB: 05-13-1945 Today's Date: 03/01/2012 Time: 1308-6578 PT Time Calculation (min): 32 min  PT Assessment / Plan / Recommendation Comments on Treatment Session  Pt continues to make progress with mobilty.  During exercises pt still c/o nausea.      Follow Up Recommendations  SNF     Does the patient have the potential to tolerate intense rehabilitation     Barriers to Discharge        Equipment Recommendations  Rolling walker with 5" wheels;3 in 1 bedside comode    Recommendations for Other Services    Frequency 7X/week   Plan Discharge plan remains appropriate    Precautions / Restrictions Precautions Precautions: Knee Required Braces or Orthoses: Knee Immobilizer - Left Knee Immobilizer - Left: Discontinue once straight leg raise with < 10 degree lag Restrictions Weight Bearing Restrictions: No Other Position/Activity Restrictions: WBAT   Pertinent Vitals/Pain 4/10    Mobility  Bed Mobility Bed Mobility: Supine to Sit Supine to Sit: 1: +2 Total assist;With rails;HOB elevated Supine to Sit: Patient Percentage: 60% Details for Bed Mobility Assistance: Assist for LLE off EOB with cues for hand placement, safety and technique.  Some assist for trunk as pt was sitting up.  Transfers Transfers: Sit to Stand;Stand to Sit;Stand Pivot Transfers Sit to Stand: 1: +2 Total assist;With upper extremity assist;From bed Sit to Stand: Patient Percentage: 60% Stand to Sit: 1: +2 Total assist;With upper extremity assist;To chair/3-in-1 Stand to Sit: Patient Percentage: 60% Details for Transfer Assistance: Assist to rise with min cues for hand placement and LE management.  Ambulation/Gait Ambulation/Gait Assistance: 1: +2 Total assist Ambulation/Gait: Patient Percentage: 70% Ambulation Distance (Feet): 35 Feet Assistive device: Rolling walker Ambulation/Gait Assistance Details: Min cues for  sequencing/technique with RW, upright posture and safety.  Gait Pattern: Step-to pattern;Decreased stride length;Antalgic    Exercises Total Joint Exercises Ankle Circles/Pumps: AROM;Both;20 reps Quad Sets: AROM;Left;10 reps Heel Slides: AAROM;Left;10 reps Hip ABduction/ADduction: AAROM;Left;10 reps Straight Leg Raises: AAROM;Left;10 reps   PT Diagnosis:    PT Problem List:   PT Treatment Interventions:     PT Goals Acute Rehab PT Goals PT Goal Formulation: With patient Time For Goal Achievement: 03/07/12 Potential to Achieve Goals: Good Pt will go Supine/Side to Sit: with supervision PT Goal: Supine/Side to Sit - Progress: Progressing toward goal Pt will go Sit to Stand: with supervision PT Goal: Sit to Stand - Progress: Progressing toward goal Pt will Ambulate: 51 - 150 feet;with supervision;with least restrictive assistive device PT Goal: Ambulate - Progress: Progressing toward goal Pt will Perform Home Exercise Program: with supervision, verbal cues required/provided PT Goal: Perform Home Exercise Program - Progress: Progressing toward goal  Visit Information  Last PT Received On: 03/01/12 Assistance Needed: +2    Subjective Data  Subjective: I just keep feeling nauseous and can't get anything on my stomach,  Patient Stated Goal: to get better   Cognition  Overall Cognitive Status: Appears within functional limits for tasks assessed/performed Arousal/Alertness: Awake/alert Orientation Level: Appears intact for tasks assessed Behavior During Session: [facility] for tasks performed    Balance     End of Session PT - End of Session Equipment Utilized During Treatment: Left knee immobilizer Activity Tolerance: Patient limited by fatigue;Other (comment) Patient left: in chair;with call bell/phone within reach Nurse Communication: Mobility status   GP     Gregory Estrada, Gregory Estrada 03/01/2012, 2:26 PM

## 2012-03-02 LAB — CBC
HCT: 27.5 % — ABNORMAL LOW (ref 39.0–52.0)
MCHC: 32.7 g/dL (ref 30.0–36.0)
MCV: 82.8 fL (ref 78.0–100.0)
Platelets: 125 10*3/uL — ABNORMAL LOW (ref 150–400)
RDW: 14.5 % (ref 11.5–15.5)

## 2012-03-02 LAB — GLUCOSE, CAPILLARY
Glucose-Capillary: 110 mg/dL — ABNORMAL HIGH (ref 70–99)
Glucose-Capillary: 121 mg/dL — ABNORMAL HIGH (ref 70–99)
Glucose-Capillary: 144 mg/dL — ABNORMAL HIGH (ref 70–99)

## 2012-03-02 LAB — BASIC METABOLIC PANEL
BUN: 14 mg/dL (ref 6–23)
Creatinine, Ser: 0.87 mg/dL (ref 0.50–1.35)
GFR calc Af Amer: >90 mL/min (ref >90)
GFR calc non Af Amer: 88 mL/min — ABNORMAL LOW (ref >90)
Potassium: 4.1 mEq/L (ref 3.5–5.1)

## 2012-03-02 NOTE — Progress Notes (Signed)
Physical Therapy Treatment Patient Details Name: Gregory Estrada MRN: [id number] DOB: June 18, 1945 Today's Date: 03/02/2012 Time: 1536-1600 PT Time Calculation (min): 24 min  PT Assessment / Plan / Recommendation Comments on Treatment Session  Pt continues to make progress with mobilty.     Follow Up Recommendations  SNF     Does the patient have the potential to tolerate intense rehabilitation     Barriers to Discharge        Equipment Recommendations  Rolling walker with 5" wheels;3 in 1 bedside comode    Recommendations for Other Services OT consult  Frequency 7X/week   Plan Discharge plan remains appropriate    Precautions / Restrictions Precautions Precautions: Knee Required Braces or Orthoses: Knee Immobilizer - Left Knee Immobilizer - Left: Discontinue once straight leg raise with < 10 degree lag Restrictions Weight Bearing Restrictions: No Other Position/Activity Restrictions: WBAT   Pertinent Vitals/Pain     Mobility  Bed Mobility Bed Mobility: Sit to Supine Sit to Supine: 4: Min assist Details for Bed Mobility Assistance: cues for sequence and min assist with L LE Transfers Transfers: Sit to Stand;Stand to Sit Sit to Stand: 4: Min guard;With upper extremity assist;From bed;From chair/3-in-1;With armrests Stand to Sit: 4: Min assist;With upper extremity assist;With armrests;To chair/3-in-1;To bed Details for Transfer Assistance: cues for LE management and use of UEs to self assist Ambulation/Gait Ambulation/Gait Assistance: 4: Min assist Ambulation Distance (Feet): 100 Feet (and 5') Assistive device: Rolling walker Ambulation/Gait Assistance Details: cues for posture, sequence and position from RW Gait Pattern: Step-to pattern;Decreased step length - right;Decreased step length - left    Exercises Total Joint Exercises Ankle Circles/Pumps: AROM;Both;20 reps Quad Sets: AROM;Left;20 reps Heel Slides: AAROM;Left;Supine;20 reps Straight Leg Raises:  AAROM;Left;Supine;20 reps   PT Diagnosis:    PT Problem List:   PT Treatment Interventions:     PT Goals Acute Rehab PT Goals PT Goal Formulation: With patient Time For Goal Achievement: 03/07/12 Potential to Achieve Goals: Good Pt will go Supine/Side to Sit: with supervision PT Goal: Supine/Side to Sit - Progress: Progressing toward goal Pt will go Sit to Supine/Side: with supervision PT Goal: Sit to Supine/Side - Progress: Progressing toward goal Pt will go Sit to Stand: with supervision PT Goal: Sit to Stand - Progress: Progressing toward goal Pt will Ambulate: 51 - 150 feet;with supervision;with least restrictive assistive device PT Goal: Ambulate - Progress: Progressing toward goal Pt will Perform Home Exercise Program: with supervision, verbal cues required/provided PT Goal: Perform Home Exercise Program - Progress: Progressing toward goal  Visit Information  Last PT Received On: 03/02/12 Assistance Needed: +1    Subjective Data  Subjective: I'm feeling better than yesterday Patient Stated Goal: to get better   Cognition  Overall Cognitive Status: Appears within functional limits for tasks assessed/performed Arousal/Alertness: Awake/alert Orientation Level: Appears intact for tasks assessed Behavior During Session: [facility] for tasks performed    Balance     End of Session PT - End of Session Equipment Utilized During Treatment: Left knee immobilizer Activity Tolerance: Patient limited by fatigue;Other (comment) Patient left: with call bell/phone within reach;in bed Nurse Communication: Mobility status   GP     Arnita Koons 03/02/2012, 4:38 PM

## 2012-03-02 NOTE — Progress Notes (Signed)
Occupational Therapy Treatment Patient Details Name: Gregory Estrada MRN: [id number] DOB: [birth year] Today's Date: 03/02/2012 Time: 1324-4010 OT Time Calculation (min): 21 min  OT Assessment / Plan / Recommendation Comments on Treatment Session Pt feeling much better today with no complaint of dizziness or nausea. Able to transfer to sink and groom and then up to chair. Planning SNF.    Follow Up Recommendations  SNF    Barriers to Discharge       Equipment Recommendations  Rolling walker with 5" wheels;3 in 1 bedside comode    Recommendations for Other Services    Frequency Min 2X/week   Plan Discharge plan remains appropriate    Precautions / Restrictions Precautions Precautions: Knee Required Braces or Orthoses: Knee Immobilizer - Left Knee Immobilizer - Left: Discontinue once straight leg raise with < 10 degree lag Restrictions Weight Bearing Restrictions: No        ADL  Grooming: Performed;Teeth care;Minimal assistance Where Assessed - Grooming: Supported standing;Other (comment) (held to sink with one hand and brushed with other) Toilet Transfer: Simulated;Minimal assistance Toilet Transfer Method: Stand pivot;Other (comment) (from sink to step around to recliner) Equipment Used: Rolling walker ADL Comments: Initially nurse tech in room for standing and a few steps toward sink but pt with no complaint of dizziness or nausea today. Able to transfer the rest of the way to the sink and to the chair with +1 assist. Min verbal cues for hand placement with functional transfers.     OT Diagnosis:    OT Problem List:   OT Treatment Interventions:     OT Goals ADL Goals ADL Goal: Grooming - Progress: Progressing toward goals ADL Goal: Toilet Transfer - Progress: Progressing toward goals  Visit Information  Last OT Received On: 03/02/12 Assistance Needed: +1 (for transfer to sink and chair)    Subjective Data  Subjective: I feel better today Patient Stated Goal:  wants to be up and moving more   Prior Functioning       Cognition  Overall Cognitive Status: Appears within functional limits for tasks assessed/performed Arousal/Alertness: Awake/alert Orientation Level: Appears intact for tasks assessed Behavior During Session: [facility] for tasks performed    Mobility  Shoulder Instructions Bed Mobility Bed Mobility: Supine to Sit Supine to Sit: 4: Min assist;3: Mod assist Details for Bed Mobility Assistance: min verbal cues for technique.pt did better with bringing trunk to upright with holding to rail. some assist for L LE over to EOB and stabilizing trunk to upright. Transfers Transfers: Sit to Stand;Stand to Sit Sit to Stand: 4: Min assist;With upper extremity assist;From bed;From elevated surface Stand to Sit: 4: Min assist;With upper extremity assist;To chair/3-in-1 Details for Transfer Assistance: assist to rise and stabilize as well as control descent. Min verbal cues for hand placement.       Exercises      Balance Balance Balance Assessed: Yes Dynamic Standing Balance Dynamic Standing - Balance Support: Left upper extremity supported Dynamic Standing - Level of Assistance: 4: Min assist   End of Session OT - End of Session Activity Tolerance: Patient tolerated treatment well Patient left: in chair;with call bell/phone within reach  GO     Gregory Estrada [phone] 03/02/2012, 9:36 AM

## 2012-03-02 NOTE — Progress Notes (Signed)
Pt has request to put himself on CPAP tonight. RT will continue to monitor.

## 2012-03-02 NOTE — Progress Notes (Signed)
Physical Therapy Treatment Patient Details Name: Gregory Estrada MRN: [id number] DOB: 07/18/1945 Today's Date: 03/02/2012 Time: 0865-7846 PT Time Calculation (min): 33 min  PT Assessment / Plan / Recommendation Comments on Treatment Session  Pt continues to make progress with mobilty.     Follow Up Recommendations  SNF     Does the patient have the potential to tolerate intense rehabilitation     Barriers to Discharge        Equipment Recommendations  Rolling walker with 5" wheels;3 in 1 bedside comode    Recommendations for Other Services OT consult  Frequency 7X/week   Plan Discharge plan remains appropriate    Precautions / Restrictions Precautions Precautions: Knee Required Braces or Orthoses: Knee Immobilizer - Left Knee Immobilizer - Left: Discontinue once straight leg raise with < 10 degree lag Restrictions Weight Bearing Restrictions: No Other Position/Activity Restrictions: WBAT   Pertinent Vitals/Pain     Mobility  Transfers Transfers: Sit to Stand;Stand to Sit Sit to Stand: 4: Min assist Stand to Sit: 4: Min assist Details for Transfer Assistance: cues for LE management and use of UEs to self assist Ambulation/Gait Ambulation/Gait Assistance: 4: Min assist;3: Mod assist Ambulation Distance (Feet): 49 Feet Assistive device: Rolling walker Ambulation/Gait Assistance Details: cues for posture, sequence, position from RW and stride lengthq Gait Pattern: Step-to pattern;Decreased step length - right;Decreased step length - left    Exercises Total Joint Exercises Ankle Circles/Pumps: AROM;Both;20 reps Quad Sets: AROM;Left;20 reps Heel Slides: AAROM;Left;Supine;20 reps Straight Leg Raises: AAROM;Left;Supine;20 reps   PT Diagnosis:    PT Problem List:   PT Treatment Interventions:     PT Goals Acute Rehab PT Goals PT Goal Formulation: With patient Time For Goal Achievement: 03/07/12 Potential to Achieve Goals: Good Pt will go Sit to Stand: with  supervision PT Goal: Sit to Stand - Progress: Progressing toward goal Pt will Ambulate: 51 - 150 feet;with supervision;with least restrictive assistive device PT Goal: Ambulate - Progress: Progressing toward goal Pt will Perform Home Exercise Program: with supervision, verbal cues required/provided PT Goal: Perform Home Exercise Program - Progress: Progressing toward goal  Visit Information  Last PT Received On: 03/02/12 Assistance Needed: +1    Subjective Data  Subjective: I'm feeling better than yesterday Patient Stated Goal: to get better   Cognition  Overall Cognitive Status: Appears within functional limits for tasks assessed/performed Arousal/Alertness: Awake/alert Orientation Level: Appears intact for tasks assessed Behavior During Session: [facility] for tasks performed    Balance     End of Session PT - End of Session Equipment Utilized During Treatment: Left knee immobilizer Activity Tolerance: Patient limited by fatigue;Other (comment) Patient left: in chair;with call bell/phone within reach Nurse Communication: Mobility status   GP     Gregory Estrada 03/02/2012, 4:35 PM

## 2012-03-02 NOTE — Progress Notes (Signed)
   Subjective: 2 Days Post-Op Procedure(s) (LRB): TOTAL KNEE ARTHROPLASTY (Left) Patient reports pain as mild.   Patient seen in rounds with Dr. Lequita Halt. Patient is well, and has had no acute complaints or problems Plan is to go Skilled nursing facility after hospital stay.  Objective: Vital signs in last 24 hours: Temp:  [98.1 F (36.7 C)-98.3 F (36.8 C)] 98.1 F (36.7 C) (11/13 2222) Pulse Rate:  [88-100] 88  (11/13 2222) Resp:  [16] 16  (11/13 2222) BP: (128-158)/(60-72) 128/60 mmHg (11/13 2222) SpO2:  [99 %-100 %] 100 % (11/13 2222)  Intake/Output from previous day:  Intake/Output Summary (Last 24 hours) at 03/02/12 2234 Last data filed at 03/02/12 1500  Gross per 24 hour  Intake    400 ml  Output   1350 ml  Net   -950 ml    Intake/Output this shift:    Labs:  Basename 03/02/12 0425 03/01/12 0454  HGB 9.0* 9.3*    Basename 03/02/12 0425 03/01/12 0454  WBC 7.2 5.8  RBC 3.32* 3.47*  HCT 27.5* 29.3*  PLT 125* 118*    Basename 03/02/12 0425 03/01/12 0454  NA 134* 134*  K 4.1 3.9  CL 101 101  CO2 25 27  BUN 14 12  CREATININE 0.87 0.92  GLUCOSE 134* 124*  CALCIUM 8.5 8.3*   No results found for this basename: LABPT:2,INR:2 in the last 72 hours  EXAM General - Patient is Alert, Appropriate and Oriented Extremity - Neurovascular intact Sensation intact distally Dressing/Incision - clean, dry, no drainage, healing Motor Function - intact, moving foot and toes well on exam.   Past Medical History  Diagnosis Date  . Back pain   . Hypertension   . Arthritis   . Prostate enlargement   . Anemia     B 12 DEFICIENCY  . Diabetes mellitus without complication   . Sleep apnea     USES C-PAP    Assessment/Plan: 2 Days Post-Op Procedure(s) (LRB): TOTAL KNEE ARTHROPLASTY (Left) Principal Problem:  *OA (osteoarthritis) of knee Active Problems:  Postop Acute blood loss anemia  Hyponatremia  Estimated Body mass index is 42.04 kg/(m^2) as calculated  from the following:   Height as of this encounter: 6\' 0" (1.829 m).   Weight as of this encounter: 310 lb(140.615 kg). Up with therapy Plan for discharge tomorrow Discharge to SNF  DVT Prophylaxis - Xarelto Weight-Bearing as tolerated to left leg  PERKINS, ALEXZANDREW 03/02/2012, 10:34 PM

## 2012-03-03 DIAGNOSIS — G4733 Obstructive sleep apnea (adult) (pediatric): Secondary | ICD-10-CM | POA: Diagnosis not present

## 2012-03-03 DIAGNOSIS — K5900 Constipation, unspecified: Secondary | ICD-10-CM | POA: Diagnosis not present

## 2012-03-03 DIAGNOSIS — Z5189 Encounter for other specified aftercare: Secondary | ICD-10-CM | POA: Diagnosis not present

## 2012-03-03 DIAGNOSIS — E119 Type 2 diabetes mellitus without complications: Secondary | ICD-10-CM | POA: Diagnosis not present

## 2012-03-03 DIAGNOSIS — M1710 Unilateral primary osteoarthritis, unspecified knee: Secondary | ICD-10-CM | POA: Diagnosis not present

## 2012-03-03 DIAGNOSIS — I10 Essential (primary) hypertension: Secondary | ICD-10-CM | POA: Diagnosis not present

## 2012-03-03 DIAGNOSIS — Z96659 Presence of unspecified artificial knee joint: Secondary | ICD-10-CM | POA: Diagnosis not present

## 2012-03-03 DIAGNOSIS — D62 Acute posthemorrhagic anemia: Secondary | ICD-10-CM | POA: Diagnosis not present

## 2012-03-03 LAB — GLUCOSE, CAPILLARY
Glucose-Capillary: 134 mg/dL — ABNORMAL HIGH (ref 70–99)
Glucose-Capillary: 165 mg/dL — ABNORMAL HIGH (ref 70–99)

## 2012-03-03 LAB — BASIC METABOLIC PANEL
Chloride: 98 mEq/L (ref 96–112)
GFR calc Af Amer: >90 mL/min (ref >90)
GFR calc non Af Amer: 87 mL/min — ABNORMAL LOW (ref >90)
Potassium: 3.9 mEq/L (ref 3.5–5.1)
Sodium: 133 mEq/L — ABNORMAL LOW (ref 135–145)

## 2012-03-03 LAB — CBC
Hemoglobin: 9.8 g/dL — ABNORMAL LOW (ref 13.0–17.0)
Platelets: 119 10*3/uL — ABNORMAL LOW (ref 150–400)
RBC: 3.55 MIL/uL — ABNORMAL LOW (ref 4.22–5.81)
WBC: 6.3 10*3/uL (ref 4.0–10.5)

## 2012-03-03 MED ORDER — ONDANSETRON HCL 4 MG PO TABS: 4.0000 mg | ORAL_TABLET | Freq: Four times a day (QID) | ORAL | Status: DC | PRN

## 2012-03-03 MED ORDER — DSS 100 MG PO CAPS: 100.0000 mg | ORAL_CAPSULE | Freq: Two times a day (BID) | ORAL | Status: DC

## 2012-03-03 MED ORDER — BISACODYL 10 MG RE SUPP: 10.0000 mg | Freq: Every day | RECTAL | Status: DC | PRN

## 2012-03-03 MED ORDER — POLYSACCHARIDE IRON COMPLEX 150 MG PO CAPS: 150.0000 mg | ORAL_CAPSULE | Freq: Every day | ORAL | Status: DC

## 2012-03-03 MED ORDER — METHOCARBAMOL 500 MG PO TABS: 500.0000 mg | ORAL_TABLET | Freq: Four times a day (QID) | ORAL | Status: DC | PRN

## 2012-03-03 MED ORDER — OXYCODONE HCL 5 MG PO TABS: 5.0000 mg | ORAL_TABLET | ORAL | Status: DC | PRN

## 2012-03-03 MED ORDER — ACETAMINOPHEN 325 MG PO TABS: 650.0000 mg | ORAL_TABLET | Freq: Four times a day (QID) | ORAL | Status: DC | PRN

## 2012-03-03 MED ORDER — RIVAROXABAN 10 MG PO TABS: 10.0000 mg | ORAL_TABLET | Freq: Every day | ORAL | Status: DC

## 2012-03-03 MED ORDER — POLYETHYLENE GLYCOL 3350 17 G PO PACK: 17.0000 g | PACK | Freq: Every day | ORAL | Status: DC | PRN

## 2012-03-03 MED ORDER — TRAMADOL HCL 50 MG PO TABS: 50.0000 mg | ORAL_TABLET | Freq: Four times a day (QID) | ORAL | Status: DC | PRN

## 2012-03-03 NOTE — Plan of Care (Signed)
Problem: Consults Goal: Diagnosis- Total Joint Replacement Outcome: Completed/Met Date Met:  03/03/12 Primary Total Knee LEFT     

## 2012-03-03 NOTE — Progress Notes (Signed)
Clinical Social Work Department CLINICAL SOCIAL WORK PLACEMENT NOTE 03/03/2012  Patient:  Gregory Estrada, Gregory Estrada  Account Number:  [id number] Admit date:  02/29/2012  Clinical Social Worker:  Cori Razor, LCSW  Date/time:  02/29/2012 12:00 M  Clinical Social Work is seeking post-discharge placement for this patient at the following level of care:   SKILLED NURSING   (*CSW will update this form in Epic as items are completed)     Patient/family provided with [facility] Department of Clinical Social Work's list of facilities offering this level of care within the geographic area requested by the patient (or if unable, by the patient's family).    Patient/family informed of their freedom to choose among providers that offer the needed level of care, that participate in Medicare, Medicaid or managed care program needed by the patient, have an available bed and are willing to accept the patient.    Patient/family informed of [facility]' ownership interest in [facility], as well as of the fact that they are under no obligation to receive care at this facility.  PASARR submitted to [employer] on  PASARR number received from [employer] on   FL2 transmitted to all facilities in geographic area requested by pt/family on  02/29/2012 FL2 transmitted to all facilities within larger geographic area on   Patient informed that his/her managed care company has contracts with or will negotiate with  certain facilities, including the following:     Patient/family informed of bed offers received:  02/29/2012 Patient chooses bed at [facility] Physician recommends and patient chooses bed at    Patient to be transferred to [facility] on  03/03/2012 Patient to be transferred to facility by [facility]  The following physician request were entered in Epic:   Additional Comments:  Cori Razor LCSW [phone]

## 2012-03-03 NOTE — Progress Notes (Signed)
Physical Therapy Treatment Patient Details Name: Gregory Estrada MRN: [id number] DOB: 30-Dec-1945 Today's Date: 03/03/2012 Time:  -     PT Assessment / Plan / Recommendation Comments on Treatment Session  Pt continues to make progress with mobilty.     Follow Up Recommendations  SNF     Does the patient have the potential to tolerate intense rehabilitation     Barriers to Discharge        Equipment Recommendations  Rolling walker with 5" wheels;3 in 1 bedside comode    Recommendations for Other Services OT consult  Frequency 7X/week   Plan Discharge plan remains appropriate    Precautions / Restrictions Precautions Precautions: Knee Required Braces or Orthoses: Knee Immobilizer - Left Knee Immobilizer - Left: Discontinue once straight leg raise with < 10 degree lag Restrictions Weight Bearing Restrictions: No Other Position/Activity Restrictions: WBAT   Pertinent Vitals/Pain 6/10 with activity; premedicated, cold packs provided    Mobility  Bed Mobility Bed Mobility: Supine to Sit Supine to Sit: 4: Min assist Details for Bed Mobility Assistance: cues for sequence and min assist with L LE Transfers Transfers: Sit to Stand;Stand to Sit Sit to Stand: 4: Min guard;From bed Stand to Sit: 4: Min guard;With upper extremity assist;To chair/3-in-1 Details for Transfer Assistance: cues for LE management and use of UEs to self assist Ambulation/Gait Ambulation/Gait Assistance: 4: Min guard Assistive device: Rolling walker Gait Pattern: Step-to pattern;Decreased step length - right;Decreased step length - left    Exercises Total Joint Exercises Ankle Circles/Pumps: AROM;Both;20 reps Quad Sets: AROM;Left;20 reps Heel Slides: AAROM;Left;Supine;20 reps Hip ABduction/ADduction: AAROM;Left;10 reps Straight Leg Raises: AAROM;Left;Supine;20 reps   PT Diagnosis:    PT Problem List:   PT Treatment Interventions:     PT Goals Acute Rehab PT Goals PT Goal Formulation: With  patient Time For Goal Achievement: 03/07/12 Potential to Achieve Goals: Good Pt will go Supine/Side to Sit: with supervision Pt will go Sit to Supine/Side: with supervision Pt will go Sit to Stand: with supervision Pt will Ambulate: 51 - 150 feet;with supervision;with least restrictive assistive device Pt will Perform Home Exercise Program: with supervision, verbal cues required/provided  Visit Information  Last PT Received On: 03/03/12 Assistance Needed: +1    Subjective Data  Patient Stated Goal: to get better   Cognition  Overall Cognitive Status: Appears within functional limits for tasks assessed/performed Arousal/Alertness: Awake/alert Orientation Level: Appears intact for tasks assessed Behavior During Session: [facility] for tasks performed    Balance     End of Session PT - End of Session Equipment Utilized During Treatment: Left knee immobilizer Activity Tolerance: Patient tolerated treatment well Patient left: with call bell/phone within reach;in chair Nurse Communication: Mobility status   GP     Wayne Wicklund 03/03/2012, 12:01 PM

## 2012-03-03 NOTE — Progress Notes (Signed)
   Subjective: 3 Days Post-Op Procedure(s) (LRB): TOTAL KNEE ARTHROPLASTY (Left) Patient reports pain as mild.   Patient seen in rounds by Dr. Lequita Halt. Patient is well, and has had no acute complaints or problems Patient is ready to go to [facility].  Objective: Vital signs in last 24 hours: Temp:  [98.1 F (36.7 C)-98.7 F (37.1 C)] 98.7 F (37.1 C) (11/14 0458) Pulse Rate:  [88-100] 100  (11/14 0458) Resp:  [16] 16  (11/14 0458) BP: (128-153)/(60-79) 153/79 mmHg (11/14 0458) SpO2:  [97 %-100 %] 97 % (11/14 0458)  Intake/Output from previous day:  Intake/Output Summary (Last 24 hours) at 03/03/12 0830 Last data filed at 03/03/12 0728  Gross per 24 hour  Intake      0 ml  Output   1350 ml  Net  -1350 ml    Intake/Output this shift: Total I/O In: -  Out: 225 [Urine:225]  Labs:  [facility] 03/03/12 0452 03/02/12 0425 03/01/12 0454  HGB 9.8* 9.0* 9.3*    Basename 03/03/12 0452 03/02/12 0425  WBC 6.3 7.2  RBC 3.55* 3.32*  HCT 29.7* 27.5*  PLT 119* 125*    Basename 03/03/12 0452 03/02/12 0425  NA 133* 134*  K 3.9 4.1  CL 98 101  CO2 25 25  BUN 18 14  CREATININE 0.90 0.87  GLUCOSE 141* 134*  CALCIUM 8.9 8.5   No results found for this basename: LABPT:2,INR:2 in the last 72 hours  EXAM: General - Patient is Alert, Appropriate and Oriented Extremity - Neurovascular intact Sensation intact distally Dorsiflexion/Plantar flexion intact No cellulitis present Incision - clean, dry, no drainage, healing Motor Function - intact, moving foot and toes well on exam.   Assessment/Plan: 3 Days Post-Op Procedure(s) (LRB): TOTAL KNEE ARTHROPLASTY (Left) Procedure(s) (LRB): TOTAL KNEE ARTHROPLASTY (Left) Past Medical History  Diagnosis Date  . Back pain   . Hypertension   . Arthritis   . Prostate enlargement   . Anemia     B 12 DEFICIENCY  . Diabetes mellitus without complication   . Sleep apnea     USES C-PAP   Principal Problem:  *OA (osteoarthritis)  of knee Active Problems:  Postop Acute blood loss anemia  Hyponatremia  Estimated Body mass index is 42.04 kg/(m^2) as calculated from the following:   Height as of this encounter: 6\' 0" (1.829 m).   Weight as of this encounter: 310 lb(140.615 kg). Discharge to SNF Diet - Cardiac diet and Diabetic diet Follow up - in 2 weeks Activity - WBAT Disposition - Skilled nursing facility Condition Upon Discharge - Good D/C Meds - See DC Summary DVT Prophylaxis - Xarelto  Efrem Pitstick 03/03/2012, 8:30 AM

## 2012-03-03 NOTE — Discharge Summary (Signed)
Physician Discharge Summary   Patient ID: Gregory Estrada MRN: [id number] DOB/AGE: Jul 08, 1945 66 y.o.  Admit date: 02/29/2012 Discharge date: 03/03/2012  Primary Diagnosis: Osteoarthritis Left knee   Admission Diagnoses:  Past Medical History  Diagnosis Date  . Back pain   . Hypertension   . Arthritis   . Prostate enlargement   . Anemia     B 12 DEFICIENCY  . Diabetes mellitus without complication   . Sleep apnea     USES C-PAP   Discharge Diagnoses:   Principal Problem:  *OA (osteoarthritis) of knee Active Problems:  Postop Acute blood loss anemia  Hyponatremia  Estimated Body mass index is 42.04 kg/(m^2) as calculated from the following:   Height as of this encounter: 6\' 0" (1.829 m).   Weight as of this encounter: 310 lb(140.615 kg).  Classification of overweight in adults according to BMI (WHO, 1998)   Procedure:  Procedure(s) (LRB): TOTAL KNEE ARTHROPLASTY (Left)   Consults: None  HPI: Gregory Estrada is a 66 y.o. year old male with end stage OA of his left knee with progressively worsening pain and dysfunction. He has constant pain, with activity and at rest and significant functional deficits with difficulties even with ADLs. He has had extensive non-op management including analgesics, injections of cortisone and viscosupplements, and home exercise program, but remains in significant pain with significant dysfunction. Radiographs show bone on bone arthritis medial and patellofemoral. He presents now for left Total Knee Arthroplasty.   Laboratory Data: Admission on 02/29/2012  Component Date Value Range Status  . ABO/RH(D) 02/29/2012 A POS   Final  . Antibody Screen 02/29/2012 NEG   Final  . Sample Expiration 02/29/2012 03/03/2012   Final  . Glucose-Capillary 02/29/2012 122* 70 - 99 mg/dL Final  . Comment 1 56/21/3086 Documented in Chart   Final  . ABO/RH(D) 02/29/2012 A POS   Final  . Glucose-Capillary 02/29/2012 147* 70 - 99 mg/dL Final  . WBC 57/84/6962  5.8  4.0 - 10.5 K/uL Final  . RBC 03/01/2012 3.47* 4.22 - 5.81 MIL/uL Final  . Hemoglobin 03/01/2012 9.3* 13.0 - 17.0 g/dL Final  . HCT 95/28/4132 29.3* 39.0 - 52.0 % Final  . MCV 03/01/2012 84.4  78.0 - 100.0 fL Final  . MCH 03/01/2012 26.8  26.0 - 34.0 pg Final  . MCHC 03/01/2012 31.7  30.0 - 36.0 g/dL Final  . RDW 44/04/270 14.8  11.5 - 15.5 % Final  . Platelets 03/01/2012 118* 150 - 400 K/uL Final  . Sodium 03/01/2012 134* 135 - 145 mEq/L Final  . Potassium 03/01/2012 3.9  3.5 - 5.1 mEq/L Final  . Chloride 03/01/2012 101  96 - 112 mEq/L Final  . CO2 03/01/2012 27  19 - 32 mEq/L Final  . Glucose, Bld 03/01/2012 124* 70 - 99 mg/dL Final  . BUN 53/66/4403 12  6 - 23 mg/dL Final  . Creatinine, Ser 03/01/2012 0.92  0.50 - 1.35 mg/dL Final  . Calcium 47/42/5956 8.3* 8.4 - 10.5 mg/dL Final  . GFR calc non Af Amer 03/01/2012 86* >90 mL/min Final  . GFR calc Af Amer 03/01/2012 >90  >90 mL/min Final   Comment:                                 The eGFR has been calculated  using the CKD EPI equation.                          This calculation has not been                          validated in all clinical                          situations.                          eGFR's persistently                          <90 mL/min signify                          possible Chronic Kidney Disease.  . Glucose-Capillary 02/29/2012 182* 70 - 99 mg/dL Final  . Glucose-Capillary 02/29/2012 172* 70 - 99 mg/dL Final  . Glucose-Capillary 02/29/2012 158* 70 - 99 mg/dL Final  . Glucose-Capillary 03/01/2012 133* 70 - 99 mg/dL Final  . Comment 1 04/54/0981 Notify RN   Final  . Glucose-Capillary 03/01/2012 163* 70 - 99 mg/dL Final  . Comment 1 19/14/7829 Notify RN   Final  . Glucose-Capillary 03/01/2012 174* 70 - 99 mg/dL Final  . WBC 56/21/3086 7.2  4.0 - 10.5 K/uL Final  . RBC 03/02/2012 3.32* 4.22 - 5.81 MIL/uL Final  . Hemoglobin 03/02/2012 9.0* 13.0 - 17.0 g/dL Final  . HCT  57/84/6962 27.5* 39.0 - 52.0 % Final  . MCV 03/02/2012 82.8  78.0 - 100.0 fL Final  . MCH 03/02/2012 27.1  26.0 - 34.0 pg Final  . MCHC 03/02/2012 32.7  30.0 - 36.0 g/dL Final  . RDW 95/28/4132 14.5  11.5 - 15.5 % Final  . Platelets 03/02/2012 125* 150 - 400 K/uL Final  . Sodium 03/02/2012 134* 135 - 145 mEq/L Final  . Potassium 03/02/2012 4.1  3.5 - 5.1 mEq/L Final  . Chloride 03/02/2012 101  96 - 112 mEq/L Final  . CO2 03/02/2012 25  19 - 32 mEq/L Final  . Glucose, Bld 03/02/2012 134* 70 - 99 mg/dL Final  . BUN 44/04/270 14  6 - 23 mg/dL Final  . Creatinine, Ser 03/02/2012 0.87  0.50 - 1.35 mg/dL Final  . Calcium 53/66/4403 8.5  8.4 - 10.5 mg/dL Final  . GFR calc non Af Amer 03/02/2012 88* >90 mL/min Final  . GFR calc Af Amer 03/02/2012 >90  >90 mL/min Final   Comment:                                 The eGFR has been calculated                          using the CKD EPI equation.                          This calculation has not been                          validated in all clinical  situations.                          eGFR's persistently                          <90 mL/min signify                          possible Chronic Kidney Disease.  . Glucose-Capillary 03/01/2012 119* 70 - 99 mg/dL Final  . Glucose-Capillary 03/02/2012 110* 70 - 99 mg/dL Final  . Glucose-Capillary 03/02/2012 110* 70 - 99 mg/dL Final  . WBC 16/01/9603 6.3  4.0 - 10.5 K/uL Final  . RBC 03/03/2012 3.55* 4.22 - 5.81 MIL/uL Final  . Hemoglobin 03/03/2012 9.8* 13.0 - 17.0 g/dL Final  . HCT 54/12/8117 29.7* 39.0 - 52.0 % Final  . MCV 03/03/2012 83.7  78.0 - 100.0 fL Final  . MCH 03/03/2012 27.6  26.0 - 34.0 pg Final  . MCHC 03/03/2012 33.0  30.0 - 36.0 g/dL Final  . RDW 14/78/2956 14.6  11.5 - 15.5 % Final  . Platelets 03/03/2012 119* 150 - 400 K/uL Final   CONSISTENT WITH PREVIOUS RESULT  . Glucose-Capillary 03/02/2012 121* 70 - 99 mg/dL Final  . Glucose-Capillary 03/02/2012 144*  70 - 99 mg/dL Final  . Sodium 21/30/8657 133* 135 - 145 mEq/L Final  . Potassium 03/03/2012 3.9  3.5 - 5.1 mEq/L Final  . Chloride 03/03/2012 98  96 - 112 mEq/L Final  . CO2 03/03/2012 25  19 - 32 mEq/L Final  . Glucose, Bld 03/03/2012 141* 70 - 99 mg/dL Final  . BUN 84/69/6295 18  6 - 23 mg/dL Final  . Creatinine, Ser 03/03/2012 0.90  0.50 - 1.35 mg/dL Final  . Calcium 28/41/3244 8.9  8.4 - 10.5 mg/dL Final  . GFR calc non Af Amer 03/03/2012 87* >90 mL/min Final  . GFR calc Af Amer 03/03/2012 >90  >90 mL/min Final   Comment:                                 The eGFR has been calculated                          using the CKD EPI equation.                          This calculation has not been                          validated in all clinical                          situations.                          eGFR's persistently                          <90 mL/min signify                          possible Chronic Kidney Disease.  . Glucose-Capillary 03/03/2012 134* 70 - 99 mg/dL Final  Hospital  Outpatient Visit on 02/23/2012  Component Date Value Range Status  . MRSA, PCR 02/23/2012 NEGATIVE  NEGATIVE Final  . Staphylococcus aureus 02/23/2012 NEGATIVE  NEGATIVE Final   Comment:                                 The Xpert SA Assay (FDA                          approved for NASAL specimens                          in patients over 24 years of age),                          is one component of                          a comprehensive surveillance                          program.  Test performance has                          been validated by [employer] for patients greater                          than or equal to 8 year old.                          It is not intended                          to diagnose infection nor to                          guide or monitor treatment.  Marland Kitchen aPTT 02/23/2012 30  24 - 37 seconds Final  . WBC 02/23/2012 6.1  4.0 - 10.5 K/uL  Final  . RBC 02/23/2012 4.45  4.22 - 5.81 MIL/uL Final  . Hemoglobin 02/23/2012 12.1* 13.0 - 17.0 g/dL Final  . HCT 40/98/1191 37.1* 39.0 - 52.0 % Final  . MCV 02/23/2012 83.4  78.0 - 100.0 fL Final  . MCH 02/23/2012 27.2  26.0 - 34.0 pg Final  . MCHC 02/23/2012 32.6  30.0 - 36.0 g/dL Final  . RDW 47/82/9562 14.7  11.5 - 15.5 % Final  . Platelets 02/23/2012 149* 150 - 400 K/uL Final  . Sodium 02/23/2012 134* 135 - 145 mEq/L Final  . Potassium 02/23/2012 4.2  3.5 - 5.1 mEq/L Final  . Chloride 02/23/2012 102  96 - 112 mEq/L Final  . CO2 02/23/2012 24  19 - 32 mEq/L Final  . Glucose, Bld 02/23/2012 152* 70 - 99 mg/dL Final  . BUN 13/11/6576 21  6 - 23 mg/dL Final  . Creatinine, Ser 02/23/2012 0.89  0.50 - 1.35 mg/dL Final  . Calcium 46/96/2952 9.2  8.4 - 10.5 mg/dL Final  . Total Protein 02/23/2012 7.0  6.0 - 8.3  g/dL Final  . Albumin 95/62/1308 3.8  3.5 - 5.2 g/dL Final  . AST 65/78/4696 19  0 - 37 U/L Final  . ALT 02/23/2012 24  0 - 53 U/L Final  . Alkaline Phosphatase 02/23/2012 79  39 - 117 U/L Final  . Total Bilirubin 02/23/2012 0.3  0.3 - 1.2 mg/dL Final  . GFR calc non Af Amer 02/23/2012 87* >90 mL/min Final  . GFR calc Af Amer 02/23/2012 >90  >90 mL/min Final   Comment:                                 The eGFR has been calculated                          using the CKD EPI equation.                          This calculation has not been                          validated in all clinical                          situations.                          eGFR's persistently                          <90 mL/min signify                          possible Chronic Kidney Disease.  Marland Kitchen Prothrombin Time 02/23/2012 12.7  11.6 - 15.2 seconds Final  . INR 02/23/2012 0.96  0.00 - 1.49 Final  . Color, Urine 02/23/2012 YELLOW  YELLOW Final  . APPearance 02/23/2012 CLEAR  CLEAR Final  . Specific Gravity, Urine 02/23/2012 1.025  1.005 - 1.030 Final  . pH 02/23/2012 5.0  5.0 - 8.0 Final  . Glucose,  UA 02/23/2012 NEGATIVE  NEGATIVE mg/dL Final  . Hgb urine dipstick 02/23/2012 NEGATIVE  NEGATIVE Final  . Bilirubin Urine 02/23/2012 NEGATIVE  NEGATIVE Final  . Ketones, ur 02/23/2012 NEGATIVE  NEGATIVE mg/dL Final  . Protein, ur 29/52/8413 NEGATIVE  NEGATIVE mg/dL Final  . Urobilinogen, UA 02/23/2012 0.2  0.0 - 1.0 mg/dL Final  . Nitrite 24/40/1027 NEGATIVE  NEGATIVE Final  . Leukocytes, UA 02/23/2012 NEGATIVE  NEGATIVE Final   MICROSCOPIC NOT DONE ON URINES WITH NEGATIVE PROTEIN, BLOOD, LEUKOCYTES, NITRITE, OR GLUCOSE <1000 mg/dL.     X-Rays:Dg Chest 2 View  02/23/2012  *RADIOLOGY REPORT*  Clinical Data: Diabetes, hypertension, preoperative radiograph  CHEST - 2 VIEW  Comparison: 03/12/2010  Findings: Cardiomediastinal contours are within normal range.  No confluent airspace opacity.  No pleural effusion or pneumothorax. No acute osseous finding.  Mild multilevel degenerative changes.  IMPRESSION: No radiographic evidence of acute cardiopulmonary process.   Original Report Authenticated By: Jearld Lesch, M.D.     EKG:No orders found for this or any previous visit.   Hospital Course:  Gregory Estrada is a 66 y.o. who was admitted to [facility]. They were brought to the operating room on 02/29/2012 and underwent Procedure(s): TOTAL KNEE  ARTHROPLASTY.  Patient tolerated the procedure well and was later transferred to the recovery room and then to the orthopaedic floor for postoperative care.  They were given PO and IV analgesics for pain control following their surgery.  They were given 24 hours of postoperative antibiotics of  Anti-infectives     Start     Dose/Rate Route Frequency Ordered Stop   02/29/12 1330   ceFAZolin (ANCEF) IVPB 2 g/50 mL premix        2 g 100 mL/hr over 30 Minutes Intravenous Every 6 hours 02/29/12 1208 02/29/12 1818   02/29/12 0609   ceFAZolin (ANCEF) 3 g in dextrose 5 % 50 mL IVPB        3 g 160 mL/hr over 30 Minutes Intravenous 60 min pre-op  02/29/12 0609 02/29/12 0723         and started on DVT prophylaxis in the form of Xarelto.   PT and OT were ordered for total joint protocol.  Discharge planning consulted to help with postop disposition and equipment needs.  Patient had a good night on the evening of surgery and started to get up OOB with therapy on day one and walked 35 feet. Hemovac drain was pulled without difficulty.  Continued to work with therapy into day two walking twice with therapy.  Dressing was changed on day two and the incision was healing well.  By day three, the patient had progressed with therapy and meeting their goals.  Incision was healing well.  Patient was seen in rounds and was ready to go to [facility].   Discharge Medications: Prior to Admission medications   Medication Sig Start Date End Date Taking? Authorizing Provider  doxazosin (CARDURA) 4 MG tablet Take 4 mg by mouth every evening.   Yes Historical Provider, MD  lisinopril (PRINIVIL,ZESTRIL) 10 MG tablet Take 10 mg by mouth daily before breakfast.   Yes Historical Provider, MD  Saxagliptin-Metformin (KOMBIGLYZE XR) 2.08-998 MG TB24 Take 2 tablets by mouth every evening.   Yes Historical Provider, MD  acetaminophen (TYLENOL) 325 MG tablet Take 2 tablets (650 mg total) by mouth every 6 (six) hours as needed (or Fever >/= 101). 03/03/12   Alexzandrew Julien Girt, PA  bisacodyl (DULCOLAX) 10 MG suppository Place 1 suppository (10 mg total) rectally daily as needed. 03/03/12   Alexzandrew Perkins, PA  docusate sodium 100 MG CAPS Take 100 mg by mouth 2 (two) times daily. 03/03/12   Alexzandrew Perkins, PA  iron polysaccharides (NIFEREX) 150 MG capsule Take 1 capsule (150 mg total) by mouth daily. 03/03/12   Alexzandrew Julien Girt, PA  methocarbamol (ROBAXIN) 500 MG tablet Take 1 tablet (500 mg total) by mouth every 6 (six) hours as needed. 03/03/12   Alexzandrew Perkins, PA  ondansetron (ZOFRAN) 4 MG tablet Take 1 tablet (4 mg total) by mouth every 6 (six)  hours as needed for nausea. 03/03/12   Alexzandrew Perkins, PA  oxyCODONE (OXY IR/ROXICODONE) 5 MG immediate release tablet Take 1-2 tablets (5-10 mg total) by mouth every 3 (three) hours as needed. 03/03/12   Alexzandrew Perkins, PA  polyethylene glycol (MIRALAX / GLYCOLAX) packet Take 17 g by mouth daily as needed. 03/03/12   Alexzandrew Julien Girt, PA  rivaroxaban (XARELTO) 10 MG TABS tablet Take 1 tablet (10 mg total) by mouth daily with breakfast. Take Xarelto for two and a half more weeks, then discontinue Xarelto. 03/03/12   Alexzandrew Julien Girt, PA  traMADol (ULTRAM) 50 MG tablet Take 1-2 tablets (50-100 mg total) by mouth every  6 (six) hours as needed. 03/03/12   Alexzandrew Julien Girt, PA    Diet: Cardiac diet and Diabetic diet Activity:WBAT Follow-up:in 2 weeks Disposition - Skilled nursing facility - [facility] Discharged Condition: good   Discharge Orders    Future Orders Please Complete By Expires   Diet - low sodium heart healthy      Diet Carb Modified      Call MD / Call 911      Comments:   If you experience chest pain or shortness of breath, CALL 911 and be transported to the hospital emergency room.  If you develope a fever above 101 F, pus (white drainage) or increased drainage or redness at the wound, or calf pain, call your surgeon's office.   Discharge instructions      Comments:   Pick up stool softner and laxative for home. Do not submerge incision under water. May shower. Continue to use ice for pain and swelling from surgery.  Take Xarelto for two and a half more weeks, then discontinue Xarelto.  When discharged from the skilled rehab facility, please have the facility set up the patient's Home Health Physical Therapy prior to being released.  Also provide the patient with their medications at time of release from the facility to include their pain medication, the muscle relaxants, and their blood thinner medication.  If the patient is still at the rehab facility  at time of follow up appointment, please also assist the patient in arranging follow up appointment in our office and any transportation needs.   Constipation Prevention      Comments:   Drink plenty of fluids.  Prune juice may be helpful.  You may use a stool softener, such as Colace (over the counter) 100 mg twice a day.  Use MiraLax (over the counter) for constipation as needed.   Increase activity slowly as tolerated      Patient may shower      Comments:   You may shower without a dressing once there is no drainage.  Do not wash over the wound.  If drainage remains, do not shower until drainage stops.   Weight bearing as tolerated      Driving restrictions      Comments:   No driving until released by the physician.   Lifting restrictions      Comments:   No lifting until released by the physician.   TED hose      Comments:   Use stockings (TED hose) for 3 weeks on both leg(s).  You may remove them at night for sleeping.   Change dressing      Comments:   Change dressing daily with sterile 4 x 4 inch gauze dressing and apply TED hose. Do not submerge the incision under water.   Do not put a pillow under the knee. Place it under the heel.      Do not sit on low chairs, stoools or toilet seats, as it may be difficult to get up from low surfaces          Medication List     As of 03/03/2012  8:38 AM    STOP taking these medications         cyanocobalamin 1000 MCG/ML injection   Commonly known as: (VITAMIN B-12)      TAKE these medications         acetaminophen 325 MG tablet   Commonly known as: TYLENOL   Take 2 tablets (650 mg total)  by mouth every 6 (six) hours as needed (or Fever >/= 101).      bisacodyl 10 MG suppository   Commonly known as: DULCOLAX   Place 1 suppository (10 mg total) rectally daily as needed.      doxazosin 4 MG tablet   Commonly known as: CARDURA   Take 4 mg by mouth every evening.      DSS 100 MG Caps   Take 100 mg by mouth 2 (two) times  daily.      iron polysaccharides 150 MG capsule   Commonly known as: NIFEREX   Take 1 capsule (150 mg total) by mouth daily.      KOMBIGLYZE XR 2.08-998 MG Tb24   Generic drug: Saxagliptin-Metformin   Take 2 tablets by mouth every evening.      lisinopril 10 MG tablet   Commonly known as: PRINIVIL,ZESTRIL   Take 10 mg by mouth daily before breakfast.      methocarbamol 500 MG tablet   Commonly known as: ROBAXIN   Take 1 tablet (500 mg total) by mouth every 6 (six) hours as needed.      ondansetron 4 MG tablet   Commonly known as: ZOFRAN   Take 1 tablet (4 mg total) by mouth every 6 (six) hours as needed for nausea.      oxyCODONE 5 MG immediate release tablet   Commonly known as: Oxy IR/ROXICODONE   Take 1-2 tablets (5-10 mg total) by mouth every 3 (three) hours as needed.      polyethylene glycol packet   Commonly known as: MIRALAX / GLYCOLAX   Take 17 g by mouth daily as needed.      rivaroxaban 10 MG Tabs tablet   Commonly known as: XARELTO   Take 1 tablet (10 mg total) by mouth daily with breakfast. Take Xarelto for two and a half more weeks, then discontinue Xarelto.      traMADol 50 MG tablet   Commonly known as: ULTRAM   Take 1-2 tablets (50-100 mg total) by mouth every 6 (six) hours as needed.           Follow-up Information    Follow up with Loanne Drilling, MD. Schedule an appointment as soon as possible for a visit in 2 weeks.   Contact information:   [address], [address] [place] [address] [phone]          Signed: Patrica Duel 03/03/2012, 8:38 AM

## 2012-03-09 DIAGNOSIS — E119 Type 2 diabetes mellitus without complications: Secondary | ICD-10-CM | POA: Diagnosis not present

## 2012-03-09 DIAGNOSIS — D62 Acute posthemorrhagic anemia: Secondary | ICD-10-CM | POA: Diagnosis not present

## 2012-03-09 DIAGNOSIS — I10 Essential (primary) hypertension: Secondary | ICD-10-CM | POA: Diagnosis not present

## 2012-03-14 DIAGNOSIS — M1710 Unilateral primary osteoarthritis, unspecified knee: Secondary | ICD-10-CM | POA: Diagnosis not present

## 2012-03-16 DIAGNOSIS — M1710 Unilateral primary osteoarthritis, unspecified knee: Secondary | ICD-10-CM | POA: Diagnosis not present

## 2012-03-21 DIAGNOSIS — M1710 Unilateral primary osteoarthritis, unspecified knee: Secondary | ICD-10-CM | POA: Diagnosis not present

## 2012-03-23 DIAGNOSIS — M1710 Unilateral primary osteoarthritis, unspecified knee: Secondary | ICD-10-CM | POA: Diagnosis not present

## 2012-03-25 DIAGNOSIS — M1710 Unilateral primary osteoarthritis, unspecified knee: Secondary | ICD-10-CM | POA: Diagnosis not present

## 2012-03-28 DIAGNOSIS — M1710 Unilateral primary osteoarthritis, unspecified knee: Secondary | ICD-10-CM | POA: Diagnosis not present

## 2012-03-30 DIAGNOSIS — M1710 Unilateral primary osteoarthritis, unspecified knee: Secondary | ICD-10-CM | POA: Diagnosis not present

## 2012-04-01 DIAGNOSIS — M1710 Unilateral primary osteoarthritis, unspecified knee: Secondary | ICD-10-CM | POA: Diagnosis not present

## 2012-04-04 DIAGNOSIS — M1710 Unilateral primary osteoarthritis, unspecified knee: Secondary | ICD-10-CM | POA: Diagnosis not present

## 2012-04-06 DIAGNOSIS — M1710 Unilateral primary osteoarthritis, unspecified knee: Secondary | ICD-10-CM | POA: Diagnosis not present

## 2012-04-08 DIAGNOSIS — M1710 Unilateral primary osteoarthritis, unspecified knee: Secondary | ICD-10-CM | POA: Diagnosis not present

## 2012-04-11 DIAGNOSIS — M1710 Unilateral primary osteoarthritis, unspecified knee: Secondary | ICD-10-CM | POA: Diagnosis not present

## 2012-04-14 DIAGNOSIS — M1710 Unilateral primary osteoarthritis, unspecified knee: Secondary | ICD-10-CM | POA: Diagnosis not present

## 2012-04-18 DIAGNOSIS — M1710 Unilateral primary osteoarthritis, unspecified knee: Secondary | ICD-10-CM | POA: Diagnosis not present

## 2012-04-22 DIAGNOSIS — M1710 Unilateral primary osteoarthritis, unspecified knee: Secondary | ICD-10-CM | POA: Diagnosis not present

## 2012-04-26 DIAGNOSIS — Z96659 Presence of unspecified artificial knee joint: Secondary | ICD-10-CM | POA: Diagnosis not present

## 2012-05-10 DIAGNOSIS — M48061 Spinal stenosis, lumbar region without neurogenic claudication: Secondary | ICD-10-CM | POA: Diagnosis not present

## 2012-05-10 DIAGNOSIS — IMO0002 Reserved for concepts with insufficient information to code with codable children: Secondary | ICD-10-CM | POA: Diagnosis not present

## 2012-05-23 DIAGNOSIS — E119 Type 2 diabetes mellitus without complications: Secondary | ICD-10-CM | POA: Diagnosis not present

## 2012-06-20 DIAGNOSIS — J209 Acute bronchitis, unspecified: Secondary | ICD-10-CM | POA: Diagnosis not present

## 2012-06-28 DIAGNOSIS — M1990 Unspecified osteoarthritis, unspecified site: Secondary | ICD-10-CM | POA: Diagnosis not present

## 2012-07-07 ENCOUNTER — Ambulatory Visit
Admission: RE | Admit: 2012-07-07 | Discharge: 2012-07-07 | Disposition: A | Discharge status: Home / Self Care | Payer: Medicare Other | Source: Ambulatory Visit | Attending: Physician Assistant | Admitting: Physician Assistant

## 2012-07-07 ENCOUNTER — Other Ambulatory Visit: Payer: Self-pay | Admitting: Physician Assistant

## 2012-07-07 DIAGNOSIS — M1990 Unspecified osteoarthritis, unspecified site: Secondary | ICD-10-CM | POA: Diagnosis not present

## 2012-07-07 DIAGNOSIS — M25559 Pain in unspecified hip: Secondary | ICD-10-CM | POA: Diagnosis not present

## 2012-07-07 DIAGNOSIS — S298XXA Other specified injuries of thorax, initial encounter: Secondary | ICD-10-CM | POA: Diagnosis not present

## 2012-07-07 DIAGNOSIS — W010XXA Fall on same level from slipping, tripping and stumbling without subsequent striking against object, initial encounter: Secondary | ICD-10-CM | POA: Diagnosis not present

## 2012-07-26 DIAGNOSIS — Z96659 Presence of unspecified artificial knee joint: Secondary | ICD-10-CM | POA: Diagnosis not present

## 2012-08-02 ENCOUNTER — Other Ambulatory Visit (HOSPITAL_COMMUNITY): Payer: Self-pay | Admitting: Family Medicine

## 2012-08-02 ENCOUNTER — Ambulatory Visit (HOSPITAL_COMMUNITY)
Admission: RE | Admit: 2012-08-02 | Discharge: 2012-08-02 | Disposition: A | Discharge status: Home / Self Care | Payer: Medicare Other | Source: Ambulatory Visit | Attending: Family Medicine | Admitting: Family Medicine

## 2012-08-02 DIAGNOSIS — R109 Unspecified abdominal pain: Secondary | ICD-10-CM | POA: Diagnosis not present

## 2012-08-02 DIAGNOSIS — M545 Low back pain: Secondary | ICD-10-CM | POA: Diagnosis not present

## 2012-08-02 DIAGNOSIS — R161 Splenomegaly, not elsewhere classified: Secondary | ICD-10-CM | POA: Insufficient documentation

## 2012-08-02 DIAGNOSIS — R5381 Other malaise: Secondary | ICD-10-CM | POA: Diagnosis not present

## 2012-08-02 DIAGNOSIS — M1990 Unspecified osteoarthritis, unspecified site: Secondary | ICD-10-CM | POA: Diagnosis not present

## 2012-08-02 DIAGNOSIS — E782 Mixed hyperlipidemia: Secondary | ICD-10-CM | POA: Diagnosis not present

## 2012-08-02 DIAGNOSIS — I10 Essential (primary) hypertension: Secondary | ICD-10-CM | POA: Diagnosis not present

## 2012-08-02 DIAGNOSIS — M25569 Pain in unspecified knee: Secondary | ICD-10-CM | POA: Diagnosis not present

## 2012-08-02 DIAGNOSIS — R5383 Other fatigue: Secondary | ICD-10-CM | POA: Diagnosis not present

## 2012-08-02 DIAGNOSIS — G4733 Obstructive sleep apnea (adult) (pediatric): Secondary | ICD-10-CM | POA: Diagnosis not present

## 2012-08-02 DIAGNOSIS — K7689 Other specified diseases of liver: Secondary | ICD-10-CM | POA: Diagnosis not present

## 2012-08-23 DIAGNOSIS — M5137 Other intervertebral disc degeneration, lumbosacral region: Secondary | ICD-10-CM | POA: Diagnosis not present

## 2012-08-23 DIAGNOSIS — M47817 Spondylosis without myelopathy or radiculopathy, lumbosacral region: Secondary | ICD-10-CM | POA: Diagnosis not present

## 2012-08-23 DIAGNOSIS — M48061 Spinal stenosis, lumbar region without neurogenic claudication: Secondary | ICD-10-CM | POA: Diagnosis not present

## 2012-08-23 DIAGNOSIS — M545 Low back pain: Secondary | ICD-10-CM | POA: Diagnosis not present

## 2012-08-26 DIAGNOSIS — D696 Thrombocytopenia, unspecified: Secondary | ICD-10-CM | POA: Diagnosis not present

## 2012-08-26 DIAGNOSIS — R933 Abnormal findings on diagnostic imaging of other parts of digestive tract: Secondary | ICD-10-CM | POA: Diagnosis not present

## 2012-08-26 DIAGNOSIS — K7689 Other specified diseases of liver: Secondary | ICD-10-CM | POA: Diagnosis not present

## 2012-09-06 ENCOUNTER — Other Ambulatory Visit: Payer: Self-pay | Admitting: Orthopedic Surgery

## 2012-09-07 DIAGNOSIS — M47817 Spondylosis without myelopathy or radiculopathy, lumbosacral region: Secondary | ICD-10-CM | POA: Diagnosis not present

## 2012-09-07 DIAGNOSIS — M5137 Other intervertebral disc degeneration, lumbosacral region: Secondary | ICD-10-CM | POA: Diagnosis not present

## 2012-09-07 DIAGNOSIS — IMO0002 Reserved for concepts with insufficient information to code with codable children: Secondary | ICD-10-CM | POA: Diagnosis not present

## 2012-09-07 DIAGNOSIS — M48061 Spinal stenosis, lumbar region without neurogenic claudication: Secondary | ICD-10-CM | POA: Diagnosis not present

## 2012-09-13 ENCOUNTER — Encounter (HOSPITAL_COMMUNITY): Payer: Self-pay | Admitting: Pharmacy Technician

## 2012-09-13 ENCOUNTER — Other Ambulatory Visit: Payer: Self-pay | Admitting: Orthopedic Surgery

## 2012-09-13 MED ORDER — BUPIVACAINE LIPOSOME 1.3 % IJ SUSP: 20.0000 mL | Freq: Once | INTRAMUSCULAR | Status: DC

## 2012-09-13 MED ORDER — DEXAMETHASONE SODIUM PHOSPHATE 10 MG/ML IJ SOLN: 10.0000 mg | Freq: Once | INTRAMUSCULAR | Status: DC

## 2012-09-13 NOTE — Progress Notes (Signed)
Preoperative surgical orders have been place into the Epic hospital system for Gregory Estrada on 09/13/2012, 7:50 AM  by Patrica Duel for surgery on 09/26/2012.  Preop Total Knee orders including Experal, IV Tylenol, and IV Decadron as long as there are no contraindications to the above medications. [name], PA-C

## 2012-09-14 DIAGNOSIS — N390 Urinary tract infection, site not specified: Secondary | ICD-10-CM | POA: Diagnosis not present

## 2012-09-14 DIAGNOSIS — N400 Enlarged prostate without lower urinary tract symptoms: Secondary | ICD-10-CM | POA: Diagnosis not present

## 2012-09-20 ENCOUNTER — Encounter (HOSPITAL_COMMUNITY): Payer: Self-pay

## 2012-09-20 ENCOUNTER — Encounter (HOSPITAL_COMMUNITY)
Admission: RE | Admit: 2012-09-20 | Discharge: 2012-09-20 | Disposition: A | Discharge status: Home / Self Care | Payer: Medicare Other | Source: Ambulatory Visit | Attending: Orthopedic Surgery | Admitting: Orthopedic Surgery

## 2012-09-20 DIAGNOSIS — N390 Urinary tract infection, site not specified: Secondary | ICD-10-CM | POA: Diagnosis not present

## 2012-09-20 DIAGNOSIS — M549 Dorsalgia, unspecified: Secondary | ICD-10-CM | POA: Diagnosis not present

## 2012-09-20 DIAGNOSIS — K219 Gastro-esophageal reflux disease without esophagitis: Secondary | ICD-10-CM | POA: Diagnosis present

## 2012-09-20 DIAGNOSIS — IMO0002 Reserved for concepts with insufficient information to code with codable children: Secondary | ICD-10-CM | POA: Diagnosis not present

## 2012-09-20 DIAGNOSIS — Z471 Aftercare following joint replacement surgery: Secondary | ICD-10-CM | POA: Diagnosis not present

## 2012-09-20 DIAGNOSIS — E871 Hypo-osmolality and hyponatremia: Secondary | ICD-10-CM | POA: Diagnosis not present

## 2012-09-20 DIAGNOSIS — R269 Unspecified abnormalities of gait and mobility: Secondary | ICD-10-CM | POA: Diagnosis not present

## 2012-09-20 DIAGNOSIS — G4730 Sleep apnea, unspecified: Secondary | ICD-10-CM | POA: Diagnosis present

## 2012-09-20 DIAGNOSIS — M47817 Spondylosis without myelopathy or radiculopathy, lumbosacral region: Secondary | ICD-10-CM | POA: Diagnosis not present

## 2012-09-20 DIAGNOSIS — D62 Acute posthemorrhagic anemia: Secondary | ICD-10-CM | POA: Diagnosis not present

## 2012-09-20 DIAGNOSIS — M1710 Unilateral primary osteoarthritis, unspecified knee: Secondary | ICD-10-CM | POA: Diagnosis not present

## 2012-09-20 DIAGNOSIS — M25569 Pain in unspecified knee: Secondary | ICD-10-CM | POA: Diagnosis not present

## 2012-09-20 DIAGNOSIS — Z6841 Body Mass Index (BMI) 40.0 and over, adult: Secondary | ICD-10-CM | POA: Diagnosis not present

## 2012-09-20 DIAGNOSIS — M6281 Muscle weakness (generalized): Secondary | ICD-10-CM | POA: Diagnosis not present

## 2012-09-20 DIAGNOSIS — Z96659 Presence of unspecified artificial knee joint: Secondary | ICD-10-CM | POA: Diagnosis not present

## 2012-09-20 DIAGNOSIS — N401 Enlarged prostate with lower urinary tract symptoms: Secondary | ICD-10-CM | POA: Diagnosis not present

## 2012-09-20 DIAGNOSIS — Z01812 Encounter for preprocedural laboratory examination: Secondary | ICD-10-CM | POA: Diagnosis not present

## 2012-09-20 DIAGNOSIS — M1990 Unspecified osteoarthritis, unspecified site: Secondary | ICD-10-CM | POA: Diagnosis not present

## 2012-09-20 DIAGNOSIS — K2100 Gastro-esophageal reflux disease with esophagitis, without bleeding: Secondary | ICD-10-CM | POA: Diagnosis not present

## 2012-09-20 DIAGNOSIS — E669 Obesity, unspecified: Secondary | ICD-10-CM | POA: Diagnosis present

## 2012-09-20 DIAGNOSIS — I10 Essential (primary) hypertension: Secondary | ICD-10-CM | POA: Diagnosis not present

## 2012-09-20 DIAGNOSIS — D649 Anemia, unspecified: Secondary | ICD-10-CM | POA: Diagnosis not present

## 2012-09-20 DIAGNOSIS — E119 Type 2 diabetes mellitus without complications: Secondary | ICD-10-CM | POA: Diagnosis not present

## 2012-09-20 LAB — URINALYSIS, ROUTINE W REFLEX MICROSCOPIC
Bilirubin Urine: NEGATIVE
Ketones, ur: NEGATIVE mg/dL
Nitrite: NEGATIVE
Protein, ur: NEGATIVE mg/dL
Specific Gravity, Urine: 1.02 (ref 1.005–1.030)
Urobilinogen, UA: 0.2 mg/dL (ref 0.0–1.0)

## 2012-09-20 LAB — COMPREHENSIVE METABOLIC PANEL
Albumin: 3.7 g/dL (ref 3.5–5.2)
BUN: 19 mg/dL (ref 6–23)
Chloride: 101 mEq/L (ref 96–112)
Creatinine, Ser: 0.97 mg/dL (ref 0.50–1.35)
GFR calc Af Amer: >90 mL/min (ref >90)
Glucose, Bld: 141 mg/dL — ABNORMAL HIGH (ref 70–99)
Total Bilirubin: 0.3 mg/dL (ref 0.3–1.2)

## 2012-09-20 LAB — CBC
HCT: 37.5 % — ABNORMAL LOW (ref 39.0–52.0)
MCHC: 32.3 g/dL (ref 30.0–36.0)
MCV: 81.9 fL (ref 78.0–100.0)
RDW: 14.4 % (ref 11.5–15.5)

## 2012-09-20 LAB — PROTIME-INR
INR: 0.98 (ref 0.00–1.49)
Prothrombin Time: 12.9 seconds (ref 11.6–15.2)

## 2012-09-20 LAB — URINE MICROSCOPIC-ADD ON

## 2012-09-20 LAB — SURGICAL PCR SCREEN: Staphylococcus aureus: NEGATIVE

## 2012-09-20 NOTE — Patient Instructions (Addendum)
20 ROLLY MAGRI  09/20/2012   Your procedure is scheduled on:  09/26/12  MONDAY  Report to [facility] at  [phone]     AM.  Call this number if you have problems the morning of surgery: [phone]       Remember: BRING CPAP MASK AND TUBING WITH YOU TO HOSPITAL  Do not eat food  Or drink :After Midnight. Sunday NIGHT   Take these medicines the morning of surgery with A SIP OF WATER:NONE   .  Contacts, dentures or partial plates can not be worn to surgery  Leave suitcase in the car. After surgery it may be brought to your room.  For patients admitted to the hospital, checkout time is 11:00 AM day of  discharge.             SPECIAL INSTRUCTIONS- SEE [place] PREPARING FOR SURGERY INSTRUCTION SHEET-     DO NOT WEAR JEWELRY, LOTIONS, POWDERS, OR PERFUMES.  WOMEN-- DO NOT SHAVE LEGS OR UNDERARMS FOR 12 HOURS BEFORE SHOWERS. MEN MAY SHAVE FACE.  Patients discharged the day of surgery will not be allowed to drive home. IF going home the day of surgery, you must have a driver and someone to stay with you for the first 24 hours  Name and phone number of your driver:                                                                        Please read over the following fact sheets that you were given: MRSA Information, Incentive Spirometry Sheet, Blood Transfusion Sheet  Information                                                                                   Juanita Streight  PST [phone]  [phone]                 FAILURE TO FOLLOW THESE INSTRUCTIONS MAY RESULT IN  CANCELLATION   OF YOUR SURGERY                                                  Patient Signature _____________________________

## 2012-09-20 NOTE — Progress Notes (Signed)
Chest x ray 11/13 EPIC,  EKG 10/13 chart,  clearance with note Dr Janace Litten chart

## 2012-09-21 DIAGNOSIS — M47817 Spondylosis without myelopathy or radiculopathy, lumbosacral region: Secondary | ICD-10-CM | POA: Diagnosis not present

## 2012-09-22 DIAGNOSIS — N390 Urinary tract infection, site not specified: Secondary | ICD-10-CM | POA: Diagnosis not present

## 2012-09-25 ENCOUNTER — Other Ambulatory Visit: Payer: Self-pay | Admitting: Orthopedic Surgery

## 2012-09-25 NOTE — H&P (Signed)
Gregory Estrada. [name]  DOB: 05-Oct-1945 Single / Language: [place] / Race: White Male  Date of Admission:  09/26/2012  Chief Complaint:  Right Knee Pain  History of Present Illness The patient is a 67 year old male who comes in for a preoperative History and Physical. The patient is scheduled for a right total knee arthroplasty to be performed by Dr. Gus Rankin. Aluisio, MD at [facility] on 09/26/2012. The patient is being followed for their right knee pain and osteoarthritis. They are now out from the last cortisone injection. Symptoms reported today include: pain, swelling and giving way. The right knee has really been bothering him since the left total knee surgery. He said the right knee is ready at this point for surgery. He had a reaction to the Synvisc, so that is not an option. He is ready to proceed now with surgery on the right knee. They have been treated conservatively in the past for the above stated problem and despite conservative measures, they continue to have progressive pain and severe functional limitations and dysfunction. They have failed non-operative management including home exercise, medications, and injections. It is felt that they would benefit from undergoing total joint replacement. Risks and benefits of the procedure have been discussed with the patient and they elect to proceed with surgery. There are no active contraindications to surgery such as ongoing infection or rapidly progressive neurological disease.   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 Dizziness, Cough. Dry Mouth   Family History  Hypertension. mother Diabetes Mellitus. mother Rheumatoid Arthritis. brother Kidney disease. mother   Social History Drug/Alcohol Rehab (Currently). no Current work status. working full time Exercise. Exercises weekly; does  individual sport Living situation. live alone Illicit drug use. no Alcohol use. current drinker; drinks beer and hard liquor; only occasionally per week Copy of Drug/Alcohol Rehab (Previously). no Marital status. divorced Pain Contract. no Number of flights of stairs before winded. 2-3 Children. 2 Tobacco use. former smoker; smoke(d) 3/4 pack(s) per day Tobacco / smoke exposure. yes outdoors only Post-Surgical Plans. Plan is to go to SNF. Wants to look into [facility]. Advance Directives. Livig Will, Healthcare POA   Medication History Lisinopril ( Oral) Specific dose unknown - Active. Janumet XR ( Oral) Specific dose unknown - Active. Altace (5MG  Capsule, Oral) Active. Tylenol Extra Strength ( Oral) Specific dose unknown - Active.   Past Surgical History Back Surgery. Date: 2009. Rotator Cuff Repair - Right. Date: 2007. Appendectomy Laminectomy Finger Surgery Total Knee Replacement - Left. Date: 02/2012.   Medical History Gastroesophageal Reflux Disease Sleep Apnea Obesity Benign Prostatic Hypertrophy B12 Deficiency Colonic Polyps Hypertension Shingles Rheumatoid Arthritis Anemia   Review of Systems General:Not Present- Chills, Fever, Night Sweats, Fatigue, Weight Gain, Weight Loss and Memory Loss. Skin:Not Present- Hives, Itching, Rash, Eczema and Lesions. HEENT:Not Present- Tinnitus, Headache, Double Vision, Visual Loss, Hearing Loss and Dentures. Respiratory:Not Present- Shortness of breath with exertion, Shortness of breath at rest, Allergies, Coughing up blood and Chronic Cough. Cardiovascular:Not Present- Chest Pain, Racing/skipping heartbeats, Difficulty Breathing Lying Down, Murmur, Swelling and Palpitations. Gastrointestinal:Not Present- Bloody Stool, Heartburn, Abdominal Pain, Vomiting, Nausea, Constipation, Diarrhea, Difficulty Swallowing, Jaundice and Loss of appetitie. Male Genitourinary:Not Present- Urinary frequency,  Blood in Urine, Weak urinary stream, Discharge, Flank Pain, Incontinence, Painful Urination, Urgency, Urinary Retention and Urinating at Night. Musculoskeletal:Present- Joint Pain and Morning Stiffness. Not Present- Muscle Weakness, Muscle Pain, Back Pain  and Spasms. Neurological:Not Present- Tremor, Dizziness, Blackout spells, Paralysis, Difficulty with balance and Weakness. Psychiatric:Not Present- Insomnia.   Vitals Pulse: 72 (Regular) Resp.: 14 (Unlabored) BP: 148/70 (Sitting, Left Arm, Standard)    Physical Exam The physical exam findings are as follows:  Note: Patient is a 67 year old male with continued right knee pain.   General Mental Status - Alert, cooperative and good historian. General Appearance- pleasant. Not in acute distress. Orientation- Oriented X3. Build & Nutrition- Well nourished and Well developed.   Head and Neck Head- normocephalic, atraumatic . Neck Global Assessment- supple. no bruit auscultated on the right and no bruit auscultated on the left.   Eye Pupil- Bilateral- Regular and Round. Motion- Bilateral- EOMI. wears glasses  Chest and Lung Exam Auscultation: Breath sounds:- clear at anterior chest wall and - clear at posterior chest wall. Adventitious sounds:- No Adventitious sounds.   Cardiovascular Auscultation:Rhythm- Regular rate and rhythm. Heart Sounds- S1 WNL and S2 WNL. Murmurs & Other Heart Sounds: Murmur 1:Location- Aortic Area and Pulmonic Area. Timing- Early systolic. Grade- II/VI.   Abdomen Inspection:Contour- Generalized moderate distention. Palpation/Percussion:Tenderness- Abdomen is non-tender to palpation. Rigidity (guarding)- Abdomen is soft. Auscultation:Auscultation of the abdomen reveals - Bowel sounds normal.   Male Genitourinary Not done, not pertinent to present illness  Musculoskeletal  On exam, he's alert and oriented, in no apparent distress. His left knee  looks fantastic. His ROM is 0-125 degrees. No tenderness or instability about the left knee. Right knee range is 5-120. Marked crepitus on ROM. Tender medial greater than lateral. No instability.   RADIOGRAPHS: AP and lateral view of the right knee shows bone on bone changes in the medial and patellofemoral compartment of the right knee.  Assessment & Plan  Primary osteoarthritis of one knee (715.16) Impression: Right Knee  Note: Plan is for a Right Total Knee Replacement by Dr. Lequita Halt.  Plan is to go to SNF. Wants to look into [facility] again.  The patient does not have any contraindications and will recieve TXA (tranexamic acid) prior to surgery.  Signed electronically by Lauraine Rinne, III PA-C

## 2012-09-26 ENCOUNTER — Encounter (HOSPITAL_COMMUNITY)
Admission: RE | Disposition: A | Discharge status: Skilled Nursing Facility | Payer: Self-pay | Source: Ambulatory Visit | Attending: Orthopedic Surgery

## 2012-09-26 ENCOUNTER — Encounter (HOSPITAL_COMMUNITY): Payer: Self-pay | Admitting: *Deleted

## 2012-09-26 ENCOUNTER — Inpatient Hospital Stay (HOSPITAL_COMMUNITY)
Admission: RE | Admit: 2012-09-26 | Discharge: 2012-09-29 | DRG: 470 | Disposition: A | Discharge status: Skilled Nursing Facility | Payer: Medicare Other | Source: Ambulatory Visit | Attending: Orthopedic Surgery | Admitting: Orthopedic Surgery

## 2012-09-26 ENCOUNTER — Encounter (HOSPITAL_COMMUNITY): Payer: Self-pay | Admitting: Student

## 2012-09-26 ENCOUNTER — Inpatient Hospital Stay (HOSPITAL_COMMUNITY): Payer: Medicare Other | Admitting: Anesthesiology

## 2012-09-26 ENCOUNTER — Encounter (HOSPITAL_COMMUNITY): Payer: Self-pay | Admitting: Anesthesiology

## 2012-09-26 DIAGNOSIS — M549 Dorsalgia, unspecified: Secondary | ICD-10-CM | POA: Diagnosis not present

## 2012-09-26 DIAGNOSIS — D62 Acute posthemorrhagic anemia: Secondary | ICD-10-CM | POA: Diagnosis present

## 2012-09-26 DIAGNOSIS — Z6841 Body Mass Index (BMI) 40.0 and over, adult: Secondary | ICD-10-CM | POA: Diagnosis not present

## 2012-09-26 DIAGNOSIS — Z01812 Encounter for preprocedural laboratory examination: Secondary | ICD-10-CM

## 2012-09-26 DIAGNOSIS — K219 Gastro-esophageal reflux disease without esophagitis: Secondary | ICD-10-CM | POA: Diagnosis present

## 2012-09-26 DIAGNOSIS — I10 Essential (primary) hypertension: Secondary | ICD-10-CM | POA: Diagnosis not present

## 2012-09-26 DIAGNOSIS — Z96651 Presence of right artificial knee joint: Secondary | ICD-10-CM

## 2012-09-26 DIAGNOSIS — Z96659 Presence of unspecified artificial knee joint: Secondary | ICD-10-CM

## 2012-09-26 DIAGNOSIS — E669 Obesity, unspecified: Secondary | ICD-10-CM | POA: Diagnosis present

## 2012-09-26 DIAGNOSIS — E871 Hypo-osmolality and hyponatremia: Secondary | ICD-10-CM | POA: Diagnosis not present

## 2012-09-26 DIAGNOSIS — D649 Anemia, unspecified: Secondary | ICD-10-CM | POA: Diagnosis not present

## 2012-09-26 DIAGNOSIS — M1710 Unilateral primary osteoarthritis, unspecified knee: Principal | ICD-10-CM | POA: Diagnosis present

## 2012-09-26 DIAGNOSIS — M179 Osteoarthritis of knee, unspecified: Secondary | ICD-10-CM | POA: Diagnosis present

## 2012-09-26 DIAGNOSIS — G4730 Sleep apnea, unspecified: Secondary | ICD-10-CM | POA: Diagnosis present

## 2012-09-26 HISTORY — PX: TOTAL KNEE ARTHROPLASTY: SHX125

## 2012-09-26 LAB — GLUCOSE, CAPILLARY
Glucose-Capillary: 132 mg/dL — ABNORMAL HIGH (ref 70–99)
Glucose-Capillary: 101 mg/dL — ABNORMAL HIGH (ref 70–99)
Glucose-Capillary: 129 mg/dL — ABNORMAL HIGH (ref 70–99)
Glucose-Capillary: 145 mg/dL — ABNORMAL HIGH (ref 70–99)

## 2012-09-26 LAB — TYPE AND SCREEN

## 2012-09-26 SURGERY — ARTHROPLASTY, KNEE, TOTAL
Anesthesia: General | Site: Knee | Laterality: Right | Wound class: Clean

## 2012-09-26 MED ORDER — FENTANYL CITRATE 0.05 MG/ML IJ SOLN
INTRAMUSCULAR | Status: DC | PRN
  Administered 2012-09-26: 50 ug via INTRAVENOUS

## 2012-09-26 MED ORDER — OXYCODONE HCL 5 MG/5ML PO SOLN: 5.0000 mg | Freq: Once | ORAL | Status: DC | PRN

## 2012-09-26 MED ORDER — PHENOL 1.4 % MT LIQD: 1.0000 | OROMUCOSAL | Status: DC | PRN

## 2012-09-26 MED ORDER — MEPERIDINE HCL 50 MG/ML IJ SOLN: 6.2500 mg | INTRAMUSCULAR | Status: DC | PRN

## 2012-09-26 MED ORDER — PROPOFOL INFUSION 10 MG/ML OPTIME
INTRAVENOUS | Status: DC | PRN
  Administered 2012-09-26: 75 ug/kg/min via INTRAVENOUS

## 2012-09-26 MED ORDER — METHOCARBAMOL 500 MG PO TABS
500.0000 mg | ORAL_TABLET | Freq: Four times a day (QID) | ORAL | Status: DC | PRN
  Administered 2012-09-26 – 2012-09-27 (×2): 500 mg via ORAL
  Filled 2012-09-26 (×3): qty 1

## 2012-09-26 MED ORDER — ONDANSETRON HCL 4 MG PO TABS: 4.0000 mg | ORAL_TABLET | Freq: Four times a day (QID) | ORAL | Status: DC | PRN

## 2012-09-26 MED ORDER — KETOROLAC TROMETHAMINE 15 MG/ML IJ SOLN
15.0000 mg | Freq: Four times a day (QID) | INTRAMUSCULAR | Status: DC | PRN
  Administered 2012-09-26 – 2012-09-27 (×3): 15 mg via INTRAVENOUS
  Filled 2012-09-26 (×3): qty 1

## 2012-09-26 MED ORDER — MORPHINE SULFATE 2 MG/ML IJ SOLN
1.0000 mg | INTRAMUSCULAR | Status: DC | PRN
  Administered 2012-09-26 (×3): 2 mg via INTRAVENOUS
  Filled 2012-09-26 (×2): qty 1

## 2012-09-26 MED ORDER — TRAMADOL HCL 50 MG PO TABS: 50.0000 mg | ORAL_TABLET | Freq: Four times a day (QID) | ORAL | Status: DC | PRN

## 2012-09-26 MED ORDER — DEXTROSE 5 % IV SOLN
3.0000 g | INTRAVENOUS | Status: AC
  Administered 2012-09-26: 3 g via INTRAVENOUS

## 2012-09-26 MED ORDER — INSULIN ASPART 100 UNIT/ML ~~LOC~~ SOLN
0.0000 [IU] | Freq: Three times a day (TID) | SUBCUTANEOUS | Status: DC
  Administered 2012-09-26 – 2012-09-29 (×7): 2 [IU] via SUBCUTANEOUS

## 2012-09-26 MED ORDER — DOCUSATE SODIUM 100 MG PO CAPS
100.0000 mg | ORAL_CAPSULE | Freq: Two times a day (BID) | ORAL | Status: DC
  Administered 2012-09-26 – 2012-09-29 (×7): 100 mg via ORAL

## 2012-09-26 MED ORDER — FLEET ENEMA 7-19 GM/118ML RE ENEM: 1.0000 | ENEMA | Freq: Once | RECTAL | Status: AC | PRN

## 2012-09-26 MED ORDER — DEXAMETHASONE SODIUM PHOSPHATE 10 MG/ML IJ SOLN
10.0000 mg | Freq: Every day | INTRAMUSCULAR | Status: AC
Start: 2012-09-27 — End: 2012-09-27
  Filled 2012-09-26: qty 1

## 2012-09-26 MED ORDER — SAXAGLIPTIN-METFORMIN ER 2.5-1000 MG PO TB24: 2.0000 | ORAL_TABLET | Freq: Every day | ORAL | Status: DC

## 2012-09-26 MED ORDER — TRANEXAMIC ACID 100 MG/ML IV SOLN
1000.0000 mg | INTRAVENOUS | Status: AC
  Administered 2012-09-26: 1000 mg via INTRAVENOUS
  Filled 2012-09-26: qty 10

## 2012-09-26 MED ORDER — CHLORHEXIDINE GLUCONATE 4 % EX LIQD: 60.0000 mL | Freq: Once | CUTANEOUS | Status: DC

## 2012-09-26 MED ORDER — METOCLOPRAMIDE HCL 5 MG/ML IJ SOLN
5.0000 mg | Freq: Three times a day (TID) | INTRAMUSCULAR | Status: DC | PRN
  Administered 2012-09-26: 10 mg via INTRAVENOUS
  Filled 2012-09-26 (×3): qty 2

## 2012-09-26 MED ORDER — BUPIVACAINE LIPOSOME 1.3 % IJ SUSP
20.0000 mL | Freq: Once | INTRAMUSCULAR | Status: DC
  Filled 2012-09-26: qty 20

## 2012-09-26 MED ORDER — HYDROMORPHONE HCL PF 1 MG/ML IJ SOLN
0.2500 mg | INTRAMUSCULAR | Status: DC | PRN
  Administered 2012-09-26 (×4): 0.5 mg via INTRAVENOUS

## 2012-09-26 MED ORDER — 0.9 % SODIUM CHLORIDE (POUR BTL) OPTIME
TOPICAL | Status: DC | PRN
  Administered 2012-09-26 (×2): 1000 mL

## 2012-09-26 MED ORDER — BISACODYL 10 MG RE SUPP
10.0000 mg | Freq: Every day | RECTAL | Status: DC | PRN
  Filled 2012-09-26: qty 1

## 2012-09-26 MED ORDER — ACETAMINOPHEN 10 MG/ML IV SOLN: 1000.0000 mg | Freq: Once | INTRAVENOUS | Status: DC

## 2012-09-26 MED ORDER — POTASSIUM CHLORIDE IN NACL 20-0.9 MEQ/L-% IV SOLN
INTRAVENOUS | Status: DC
  Administered 2012-09-26 – 2012-09-27 (×2): via INTRAVENOUS
  Filled 2012-09-26 (×3): qty 1000

## 2012-09-26 MED ORDER — PROPOFOL 10 MG/ML IV BOLUS
INTRAVENOUS | Status: DC | PRN
  Administered 2012-09-26 (×2): 20 mg via INTRAVENOUS

## 2012-09-26 MED ORDER — SODIUM CHLORIDE 0.9 % IV SOLN: INTRAVENOUS | Status: DC

## 2012-09-26 MED ORDER — STERILE WATER FOR IRRIGATION IR SOLN
Status: DC | PRN
  Administered 2012-09-26: 3000 mL

## 2012-09-26 MED ORDER — PROMETHAZINE HCL 25 MG/ML IJ SOLN: 6.2500 mg | INTRAMUSCULAR | Status: DC | PRN

## 2012-09-26 MED ORDER — ONDANSETRON HCL 4 MG/2ML IJ SOLN
4.0000 mg | Freq: Four times a day (QID) | INTRAMUSCULAR | Status: DC | PRN
  Administered 2012-09-27: 4 mg via INTRAVENOUS
  Filled 2012-09-26: qty 2

## 2012-09-26 MED ORDER — BUPIVACAINE HCL 0.25 % IJ SOLN
INTRAMUSCULAR | Status: DC | PRN
  Administered 2012-09-26: 20 mL

## 2012-09-26 MED ORDER — RIVAROXABAN 10 MG PO TABS
10.0000 mg | ORAL_TABLET | Freq: Every day | ORAL | Status: DC
  Administered 2012-09-27 – 2012-09-29 (×3): 10 mg via ORAL
  Filled 2012-09-26 (×4): qty 1

## 2012-09-26 MED ORDER — ACETAMINOPHEN 10 MG/ML IV SOLN: 1000.0000 mg | Freq: Once | INTRAVENOUS | Status: DC | PRN

## 2012-09-26 MED ORDER — DEXAMETHASONE 6 MG PO TABS
10.0000 mg | ORAL_TABLET | Freq: Every day | ORAL | Status: AC
  Administered 2012-09-27: 10 mg via ORAL
  Filled 2012-09-26: qty 1

## 2012-09-26 MED ORDER — METOCLOPRAMIDE HCL 10 MG PO TABS: 5.0000 mg | ORAL_TABLET | Freq: Three times a day (TID) | ORAL | Status: DC | PRN

## 2012-09-26 MED ORDER — SODIUM CHLORIDE 0.9 % IR SOLN
Status: DC | PRN
  Administered 2012-09-26: 1000 mL

## 2012-09-26 MED ORDER — DIPHENHYDRAMINE HCL 12.5 MG/5ML PO ELIX: 12.5000 mg | ORAL_SOLUTION | ORAL | Status: DC | PRN

## 2012-09-26 MED ORDER — OXYCODONE HCL 5 MG PO TABS: 5.0000 mg | ORAL_TABLET | Freq: Once | ORAL | Status: DC | PRN

## 2012-09-26 MED ORDER — LINAGLIPTIN 5 MG PO TABS
5.0000 mg | ORAL_TABLET | Freq: Every day | ORAL | Status: DC
  Administered 2012-09-26 – 2012-09-28 (×3): 5 mg via ORAL
  Filled 2012-09-26 (×4): qty 1

## 2012-09-26 MED ORDER — LACTATED RINGERS IV SOLN
INTRAVENOUS | Status: DC | PRN
  Administered 2012-09-26: 09:00:00 via INTRAVENOUS

## 2012-09-26 MED ORDER — METFORMIN HCL ER 750 MG PO TB24
2000.0000 mg | ORAL_TABLET | Freq: Every day | ORAL | Status: DC
  Administered 2012-09-26: 2000 mg via ORAL
  Filled 2012-09-26 (×2): qty 1

## 2012-09-26 MED ORDER — MIDAZOLAM HCL 5 MG/5ML IJ SOLN
INTRAMUSCULAR | Status: DC | PRN
  Administered 2012-09-26: 2 mg via INTRAVENOUS

## 2012-09-26 MED ORDER — CEFAZOLIN SODIUM-DEXTROSE 2-3 GM-% IV SOLR
2.0000 g | Freq: Four times a day (QID) | INTRAVENOUS | Status: AC
  Administered 2012-09-26 (×2): 2 g via INTRAVENOUS
  Filled 2012-09-26 (×2): qty 50

## 2012-09-26 MED ORDER — PROPOFOL 10 MG/ML IV BOLUS: INTRAVENOUS | Status: DC | PRN

## 2012-09-26 MED ORDER — OXYCODONE HCL 5 MG PO TABS
5.0000 mg | ORAL_TABLET | ORAL | Status: DC | PRN
  Administered 2012-09-26 – 2012-09-27 (×6): 10 mg via ORAL
  Filled 2012-09-26 (×6): qty 2

## 2012-09-26 MED ORDER — POLYETHYLENE GLYCOL 3350 17 G PO PACK: 17.0000 g | PACK | Freq: Every day | ORAL | Status: DC | PRN

## 2012-09-26 MED ORDER — SODIUM CHLORIDE 0.9 % IJ SOLN
INTRAMUSCULAR | Status: DC | PRN
  Administered 2012-09-26: 10:00:00

## 2012-09-26 MED ORDER — MENTHOL 3 MG MT LOZG
1.0000 | LOZENGE | OROMUCOSAL | Status: DC | PRN
  Filled 2012-09-26: qty 9

## 2012-09-26 MED ORDER — POLYVINYL ALCOHOL 1.4 % OP SOLN
1.0000 [drp] | OPHTHALMIC | Status: DC | PRN
  Administered 2012-09-26: 1 [drp] via OPHTHALMIC
  Filled 2012-09-26: qty 15

## 2012-09-26 MED ORDER — BUPIVACAINE HCL (PF) 0.75 % IJ SOLN
INTRAMUSCULAR | Status: DC | PRN
  Administered 2012-09-26: 15 mg

## 2012-09-26 MED ORDER — ACETAMINOPHEN 325 MG PO TABS
650.0000 mg | ORAL_TABLET | Freq: Four times a day (QID) | ORAL | Status: DC | PRN
  Administered 2012-09-27 – 2012-09-29 (×4): 650 mg via ORAL
  Filled 2012-09-26 (×5): qty 2

## 2012-09-26 MED ORDER — MORPHINE SULFATE 2 MG/ML IJ SOLN
INTRAMUSCULAR | Status: AC
  Filled 2012-09-26: qty 1

## 2012-09-26 MED ORDER — METHYLCELLULOSE 1 % OP SOLN: 1.0000 [drp] | Freq: Every day | OPHTHALMIC | Status: DC | PRN

## 2012-09-26 MED ORDER — ACETAMINOPHEN 650 MG RE SUPP: 650.0000 mg | Freq: Four times a day (QID) | RECTAL | Status: DC | PRN

## 2012-09-26 MED ORDER — DOXAZOSIN MESYLATE 4 MG PO TABS
4.0000 mg | ORAL_TABLET | Freq: Every evening | ORAL | Status: DC
  Administered 2012-09-26 – 2012-09-28 (×3): 4 mg via ORAL
  Filled 2012-09-26 (×4): qty 1

## 2012-09-26 MED ORDER — METHOCARBAMOL 100 MG/ML IJ SOLN: 500.0000 mg | Freq: Four times a day (QID) | INTRAVENOUS | Status: DC | PRN

## 2012-09-26 SURGICAL SUPPLY — 57 items
BAG SPEC THK2 15X12 ZIP CLS (MISCELLANEOUS) ×1
BAG ZIPLOCK 12X15 (MISCELLANEOUS) ×2 IMPLANT
BANDAGE ELASTIC 6 VELCRO ST LF (GAUZE/BANDAGES/DRESSINGS) ×2 IMPLANT
BANDAGE ESMARK 6X9 LF (GAUZE/BANDAGES/DRESSINGS) ×1 IMPLANT
BLADE SAG 18X100X1.27 (BLADE) ×2 IMPLANT
BLADE SAW SGTL 11.0X1.19X90.0M (BLADE) ×2 IMPLANT
BNDG CMPR 9X6 STRL LF SNTH (GAUZE/BANDAGES/DRESSINGS) ×1
BNDG ESMARK 6X9 LF (GAUZE/BANDAGES/DRESSINGS) ×2
BOWL SMART MIX CTS (DISPOSABLE) ×2 IMPLANT
CAPT RP KNEE ×1 IMPLANT
CEMENT HV SMART SET (Cement) ×4 IMPLANT
CLOTH BEACON ORANGE TIMEOUT ST (SAFETY) ×2 IMPLANT
CUFF TOURN SGL QUICK 34 (TOURNIQUET CUFF) ×2
CUFF TRNQT CYL 34X4X40X1 (TOURNIQUET CUFF) ×1 IMPLANT
DECANTER SPIKE VIAL GLASS SM (MISCELLANEOUS) ×2 IMPLANT
DRAPE EXTREMITY T 121X128X90 (DRAPE) ×2 IMPLANT
DRAPE POUCH INSTRU U-SHP 10X18 (DRAPES) ×2 IMPLANT
DRAPE U-SHAPE 47X51 STRL (DRAPES) ×2 IMPLANT
DRSG ADAPTIC 3X8 NADH LF (GAUZE/BANDAGES/DRESSINGS) ×2 IMPLANT
DURAPREP 26ML APPLICATOR (WOUND CARE) ×2 IMPLANT
ELECT REM PT RETURN 9FT ADLT (ELECTROSURGICAL) ×2
ELECTRODE REM PT RTRN 9FT ADLT (ELECTROSURGICAL) ×1 IMPLANT
EVACUATOR 1/8 PVC DRAIN (DRAIN) ×2 IMPLANT
FACESHIELD LNG OPTICON STERILE (SAFETY) ×10 IMPLANT
GLOVE BIO SURGEON STRL SZ7.5 (GLOVE) IMPLANT
GLOVE BIO SURGEON STRL SZ8 (GLOVE) ×2 IMPLANT
GLOVE BIOGEL PI IND STRL 8 (GLOVE) ×1 IMPLANT
GLOVE BIOGEL PI INDICATOR 8 (GLOVE) ×1
GLOVE SURG SS PI 6.5 STRL IVOR (GLOVE) IMPLANT
GOWN STRL NON-REIN LRG LVL3 (GOWN DISPOSABLE) ×2 IMPLANT
GOWN STRL REIN XL XLG (GOWN DISPOSABLE) IMPLANT
HANDPIECE INTERPULSE COAX TIP (DISPOSABLE) ×2
IMMOBILIZER KNEE 20 (SOFTGOODS) ×2
IMMOBILIZER KNEE 20 THIGH 36 (SOFTGOODS) ×1 IMPLANT
KIT BASIN OR (CUSTOM PROCEDURE TRAY) ×2 IMPLANT
MANIFOLD NEPTUNE II (INSTRUMENTS) ×2 IMPLANT
NDL SAFETY ECLIPSE 18X1.5 (NEEDLE) ×2 IMPLANT
NEEDLE HYPO 18GX1.5 SHARP (NEEDLE) ×4
NS IRRIG 1000ML POUR BTL (IV SOLUTION) ×2 IMPLANT
PACK TOTAL JOINT (CUSTOM PROCEDURE TRAY) ×2 IMPLANT
PAD ABD 7.5X8 STRL (GAUZE/BANDAGES/DRESSINGS) ×1 IMPLANT
PADDING CAST COTTON 6X4 STRL (CAST SUPPLIES) ×4 IMPLANT
POSITIONER SURGICAL ARM (MISCELLANEOUS) ×2 IMPLANT
SET HNDPC FAN SPRY TIP SCT (DISPOSABLE) ×1 IMPLANT
SPONGE GAUZE 4X4 12PLY (GAUZE/BANDAGES/DRESSINGS) ×2 IMPLANT
STRIP CLOSURE SKIN 1/2X4 (GAUZE/BANDAGES/DRESSINGS) ×2 IMPLANT
SUCTION FRAZIER 12FR DISP (SUCTIONS) ×2 IMPLANT
SUT MNCRL AB 4-0 PS2 18 (SUTURE) ×2 IMPLANT
SUT VIC AB 2-0 CT1 27 (SUTURE) ×6
SUT VIC AB 2-0 CT1 TAPERPNT 27 (SUTURE) ×3 IMPLANT
SUT VLOC 180 0 24IN GS25 (SUTURE) ×2 IMPLANT
SYR 20CC LL (SYRINGE) ×2 IMPLANT
SYR 50ML LL SCALE MARK (SYRINGE) ×2 IMPLANT
TOWEL OR 17X26 10 PK STRL BLUE (TOWEL DISPOSABLE) ×4 IMPLANT
TRAY FOLEY CATH 14FRSI W/METER (CATHETERS) ×2 IMPLANT
WATER STERILE IRR 1500ML POUR (IV SOLUTION) ×4 IMPLANT
WRAP KNEE MAXI GEL POST OP (GAUZE/BANDAGES/DRESSINGS) ×2 IMPLANT

## 2012-09-26 NOTE — Evaluation (Signed)
Physical Therapy Evaluation Patient Details Name: Gregory Estrada MRN: [id number] DOB: 14-Jul-1945 Today's Date: 09/26/2012 Time: 1730-1810 PT Time Calculation (min): 40 min  PT Assessment / Plan / Recommendation Clinical Impression  67 yo male admitted 09/26/12 for RTKA. Pt tolerated OOB to [facility] and back to bed. Pt very sweaty so did not attempt to walk. Pt plans to DC to [place]. Pt will benefit from PT while in acute care.    PT Assessment  Patient needs continued PT services    Follow Up Recommendations  SNF    Does the patient have the potential to tolerate intense rehabilitation      Barriers to Discharge        Equipment Recommendations  None recommended by PT    Recommendations for Other Services     Frequency 7X/week    Precautions / Restrictions Precautions Precautions: Knee Required Braces or Orthoses: Knee Immobilizer - Right   Pertinent Vitals/Pain 3-4 pain R knee, ice applied.      Mobility  Bed Mobility Bed Mobility: Supine to Sit;Sitting - Scoot to Edge of Bed;Sit to Supine Supine to Sit: 3: Mod assist;HOB elevated Sitting - Scoot to Edge of Bed: 4: Min guard Sit to Supine: 4: Min assist;HOB flat Details for Bed Mobility Assistance: pt used rail to get up. Transfers Transfers: Sit to Stand;Stand to Sit;Stand Pivot Transfers Sit to Stand: 3: Mod assist;With upper extremity assist;From bed;From chair/3-in-1 Stand to Sit: To chair/3-in-1;3: Mod assist;With upper extremity assist Stand Pivot Transfers: 1: +2 Total assist Stand Pivot Transfers: Patient Percentage: 70% Details for Transfer Assistance: pt was sweaty and diaphoretic so returned to bed after pivot to [facility] and back. Ambulation/Gait Ambulation/Gait Assistance: Not tested (comment)    Exercises     PT Diagnosis: Difficulty walking;Acute pain  PT Problem List: Decreased strength;Decreased range of motion;Decreased activity tolerance;Decreased mobility;Decreased knowledge of use of DME;Decreased  safety awareness;Decreased knowledge of precautions;Pain PT Treatment Interventions: DME instruction;Gait training;Functional mobility training;Therapeutic activities;Therapeutic exercise;Patient/family education   PT Goals Acute Rehab PT Goals PT Goal Formulation: With patient Time For Goal Achievement: 10/03/12 Potential to Achieve Goals: Good Pt will go Supine/Side to Sit: with supervision PT Goal: Supine/Side to Sit - Progress: Goal set today Pt will go Sit to Supine/Side: with supervision PT Goal: Sit to Supine/Side - Progress: Goal set today Pt will go Sit to Stand: with supervision PT Goal: Sit to Stand - Progress: Goal set today Pt will go Stand to Sit: with supervision PT Goal: Stand to Sit - Progress: Goal set today Pt will Ambulate: 51 - 150 feet;with supervision;with rolling walker PT Goal: Ambulate - Progress: Goal set today Pt will Perform Home Exercise Program: with supervision, verbal cues required/provided PT Goal: Perform Home Exercise Program - Progress: Goal set today  Visit Information  Last PT Received On: 09/26/12 Assistance Needed: +2    Subjective Data  Subjective: I think i need to go to the potty. Patient Stated Goal: I am going to [place].   Prior Functioning  Home Living Lives With: Spouse Available Help at Discharge: Family Type of Home: Skilled Nursing Facility Home Adaptive Equipment: Walker - rolling;Bedside commode/3-in-1 Prior Function Level of Independence: Independent    Cognition  Cognition Arousal/Alertness: Awake/alert Behavior During Therapy: WFL for tasks assessed/performed Overall Cognitive Status: Within Functional Limits for tasks assessed    Extremity/Trunk Assessment Right Upper Extremity Assessment RUE ROM/Strength/Tone: [facility] for tasks assessed Left Upper Extremity Assessment LUE ROM/Strength/Tone: [facility] for tasks assessed Right Lower Extremity Assessment RLE ROM/Strength/Tone:  Deficits RLE ROM/Strength/Tone Deficits: able  to perform a SLR RLE Sensation: WFL - Light Touch Left Lower Extremity Assessment LLE ROM/Strength/Tone: Within functional levels LLE Sensation: WFL - Light Touch Trunk Assessment Trunk Assessment: Normal   Balance    End of Session PT - End of Session Equipment Utilized During Treatment: Right knee immobilizer Activity Tolerance: Patient limited by fatigue Patient left: in bed;with call bell/phone within reach Nurse Communication: Mobility status CPM Right Knee CPM Right Knee: Off  GP     Rada Hay 09/26/2012, 6:24 PM Blanchard Kelch PT [phone]

## 2012-09-26 NOTE — Op Note (Signed)
Pre-operative diagnosis- Osteoarthritis  Right knee(s)  Post-operative diagnosis- Osteoarthritis Right knee(s)  Procedure-  Right  Total Knee Arthroplasty  Surgeon- Gregory Rankin. Gregory Mayeda, MD  Assistant- Gregory Ped, PA-C   Anesthesia-  Spinal EBL-* No blood loss amount entered *  Drains Hemovac  Tourniquet time-  Total Tourniquet Time Documented: Thigh (Right) - 40 minutes Total: Thigh (Right) - 40 minutes    Complications- None  Condition-PACU - hemodynamically stable.   Brief Clinical Note  Gregory Estrada is a 67 y.o. year old male with end stage OA of his right knee with progressively worsening pain and dysfunction. He has constant pain, with activity and at rest and significant functional deficits with difficulties even with ADLs. He has had extensive non-op management including analgesics, injections of cortisone and viscosupplements, and home exercise program, but remains in significant pain with significant dysfunction. Radiographs show bone on bone arthritis medial and patellofemoral.  He presents now for right Total Knee Arthroplasty.    Procedure in detail---   The patient is brought into the operating room and positioned supine on the operating table. After successful administration of  Spinal,   a tourniquet is placed high on the  Right thigh(s) and the lower extremity is prepped and draped in the usual sterile fashion. Time out is performed by the operating team and then the  Right lower extremity is wrapped in Esmarch, knee flexed and the tourniquet inflated to 300 mmHg.       A midline incision is made with a ten blade through the subcutaneous tissue to the level of the extensor mechanism. A fresh blade is used to make a medial parapatellar arthrotomy. Soft tissue over the proximal medial tibia is subperiosteally elevated to the joint line with a knife and into the semimembranosus bursa with a Cobb elevator. Soft tissue over the proximal lateral tibia is elevated with  attention being paid to avoiding the patellar tendon on the tibial tubercle. The patella is everted, knee flexed 90 degrees and the ACL and PCL are removed. Findings are bone on bone medial and patellofemoral  With large medial and patellar osteophytes.       The drill is used to create a starting hole in the distal femur and the canal is thoroughly irrigated with sterile saline to remove the fatty contents. The 5 degree Right  valgus alignment guide is placed into the femoral canal and the distal femoral cutting block is pinned to remove 10 mm off the distal femur. Resection is made with an oscillating saw.      The tibia is subluxed forward and the menisci are removed. The extramedullary alignment guide is placed referencing proximally at the medial aspect of the tibial tubercle and distally along the second metatarsal axis and tibial crest. The block is pinned to remove 2mm off the more deficient medial  side. Resection is made with an oscillating saw. Size 5is the most appropriate size for the tibia and the proximal tibia is prepared with the modular drill and keel punch for that size.      The femoral sizing guide is placed and size 5 is most appropriate. Rotation is marked off the epicondylar axis and confirmed by creating a rectangular flexion gap at 90 degrees. The size 5 cutting block is pinned in this rotation and the anterior, posterior and chamfer cuts are made with the oscillating saw. The intercondylar block is then placed and that cut is made.      Trial size 5 tibial component,  trial size 5 posterior stabilized femur and a 10  mm posterior stabilized rotating platform insert trial is placed. Full extension is achieved with excellent varus/valgus and anterior/posterior balance throughout full range of motion. The patella is everted and thickness measured to be 24  mm. Free hand resection is taken to 13 mm, a 41 template is placed, lug holes are drilled, trial patella is placed, and it tracks  normally. Osteophytes are removed off the posterior femur with the trial in place. All trials are removed and the cut bone surfaces prepared with pulsatile lavage. Cement is mixed and once ready for implantation, the size 5 tibial implant, size  5 posterior stabilized femoral component, and the size 41 patella are cemented in place and the patella is held with the clamp. The trial insert is placed and the knee held in full extension. The Exparel (20 ml mixed with 30 ml saline) and .25% Bupivicaine, are injected into the extensor mechanism, posterior capsule, medial and lateral gutters and subcutaneous tissues.  All extruded cement is removed and once the cement is hard the permanent 10 mm posterior stabilized rotating platform insert is placed into the tibial tray.      The wound is copiously irrigated with saline solution and the extensor mechanism closed over a hemovac drain with #1 PDS suture. The tourniquet is released for a total tourniquet time of 40  minutes. Flexion against gravity is 135 degrees and the patella tracks normally. Subcutaneous tissue is closed with 2.0 vicryl and subcuticular with running 4.0 Monocryl. The incision is cleaned and dried and steri-strips and a bulky sterile dressing are applied. The limb is placed into a knee immobilizer and the patient is awakened and transported to recovery in stable condition.      Please note that a surgical assistant was a medical necessity for this procedure in order to perform it in a safe and expeditious manner. Surgical assistant was necessary to retract the ligaments and vital neurovascular structures to prevent injury to them and also necessary for proper positioning of the limb to allow for anatomic placement of the prosthesis.   Gregory Rankin [name], MD    09/26/2012, 10:30 AM

## 2012-09-26 NOTE — Transfer of Care (Signed)
Immediate Anesthesia Transfer of Care Note  Patient: Gregory Estrada  Procedure(s) Performed: Procedure(s) (LRB): RIGHT TOTAL KNEE ARTHROPLASTY (Right)  Patient Location: PACU  Anesthesia Type: Spinal  Level of Consciousness: sedated, patient cooperative and responds to stimulaton  Airway & Oxygen Therapy: Patient Spontanous Breathing and Patient connected to face mask oxgen  Post-op Assessment: Report given to PACU RN and Post -op Vital signs reviewed and stable  Post vital signs: Reviewed and stable  Complications: No apparent anesthesia complications

## 2012-09-26 NOTE — Plan of Care (Signed)
Problem: Consults Goal: Diagnosis- Total Joint Replacement Outcome: Completed/Met Date Met:  09/26/12 Primary Total Knee

## 2012-09-26 NOTE — Progress Notes (Signed)
Patient has been taking Cipro for UTI 

## 2012-09-26 NOTE — Anesthesia Preprocedure Evaluation (Addendum)
Anesthesia Evaluation  Patient identified by MRN, date of birth, ID band Patient awake    Reviewed: Allergy & Precautions, H&P , NPO status , Patient's Chart, lab work & pertinent test results  Airway Mallampati: III TM Distance: >3 FB Neck ROM: Full    Dental  (+) Teeth Intact and Dental Advisory Given   Pulmonary sleep apnea and Continuous Positive Airway Pressure Ventilation ,  breath sounds clear to auscultation  Pulmonary exam normal       Cardiovascular hypertension, Pt. on medications Rhythm:Regular Rate:Normal     Neuro/Psych negative neurological ROS  negative psych ROS   GI/Hepatic negative GI ROS, Neg liver ROS,   Endo/Other  diabetes, Type 2, Oral Hypoglycemic AgentsMorbid obesity  Renal/GU negative Renal ROS     Musculoskeletal negative musculoskeletal ROS (+)   Abdominal (+) + obese,   Peds  Hematology negative hematology ROS (+)   Anesthesia Other Findings   Reproductive/Obstetrics                           Anesthesia Physical  Anesthesia Plan  ASA: III  Anesthesia Plan: General   Post-op Pain Management:    Induction: Intravenous  Airway Management Planned: Oral ETT  Additional Equipment:   Intra-op Plan:   Post-operative Plan: Extubation in OR  Informed Consent: I have reviewed the patients History and Physical, chart, labs and discussed the procedure including the risks, benefits and alternatives for the proposed anesthesia with the patient or authorized representative who has indicated his/her understanding and acceptance.   Dental advisory given  Plan Discussed with: CRNA  Anesthesia Plan Comments: (We discussed risk and benefits of spinal anesthesia. Pt elects spinal anesthetic. )      Anesthesia Quick Evaluation

## 2012-09-26 NOTE — Anesthesia Postprocedure Evaluation (Signed)
Anesthesia Post Note  Patient: Gregory Estrada  Procedure(s) Performed: Procedure(s) (LRB): RIGHT TOTAL KNEE ARTHROPLASTY (Right)  Anesthesia type: Spinal  Patient location: PACU  Post pain: Pain level controlled  Post assessment: Post-op Vital signs reviewed  Last Vitals: BP 122/73  Pulse 69  Temp(Src) 36.8 C (Oral)  Resp 16  Ht 6' (1.829 m)  Wt 307 lb (139.254 kg)  BMI 41.63 kg/m2  SpO2 100%  Post vital signs: Reviewed  Level of consciousness: sedated  Complications: No apparent anesthesia complications

## 2012-09-26 NOTE — Anesthesia Procedure Notes (Signed)
Spinal  Patient location during procedure: OR Start time: 09/26/2012 9:19 AM End time: 09/26/2012 9:24 AM Staffing CRNA/Resident: Paris Lore Performed by: resident/CRNA  Preanesthetic Checklist Completed: patient identified, site marked, surgical consent, pre-op evaluation, timeout performed, IV checked, risks and benefits discussed and monitors and equipment checked Spinal Block Patient position: sitting Prep: Betadine Patient monitoring: heart rate, continuous pulse ox and blood pressure Approach: right paramedian Location: L2-3 Injection technique: single-shot Needle Needle type: Spinocan  Needle gauge: 22 G Needle length: 9 cm Needle insertion depth: 8 cm Assessment Sensory level: T4 Additional Notes Expiration date of kit checked and confirmed. Patient tolerated procedure well, without complications. L2-L3 position X 1 attempt with noted clear CSF return easy withdrawal and administration of medication.  Noted T4 level on exam.

## 2012-09-26 NOTE — Progress Notes (Signed)
UR COMPLETED  

## 2012-09-26 NOTE — Interval H&P Note (Signed)
History and Physical Interval Note:  09/26/2012 6:55 AM  Gregory Estrada  has presented today for surgery, with the diagnosis of OA RIGHT KNEE  The various methods of treatment have been discussed with the patient and family. After consideration of risks, benefits and other options for treatment, the patient has consented to  Procedure(s): RIGHT TOTAL KNEE ARTHROPLASTY (Right) as a surgical intervention .  The patient's history has been reviewed, patient examined, no change in status, stable for surgery.  I have reviewed the patient's chart and labs.  Questions were answered to the patient's satisfaction.     Loanne Drilling

## 2012-09-26 NOTE — H&P (View-Only) (Signed)
Gregory Estrada  DOB: 05/26/1945 Single / Language: English / Race: White Male  Date of Admission:  09/26/2012  Chief Complaint:  Right Knee Pain  History of Present Illness The patient is a 67 year old male who comes in for a preoperative History and Physical. The patient is scheduled for a right total knee arthroplasty to be performed by Dr. Frank V. Aluisio, MD at [facility] on 09/26/2012. The patient is being followed for their right knee pain and osteoarthritis. They are now out from the last cortisone injection. Symptoms reported today include: pain, swelling and giving way. The right knee has really been bothering him since the left total knee surgery. He said the right knee is ready at this point for surgery. He had a reaction to the Synvisc, so that is not an option. He is ready to proceed now with surgery on the right knee. They have been treated conservatively in the past for the above stated problem and despite conservative measures, they continue to have progressive pain and severe functional limitations and dysfunction. They have failed non-operative management including home exercise, medications, and injections. It is felt that they would benefit from undergoing total joint replacement. Risks and benefits of the procedure have been discussed with the patient and they elect to proceed with surgery. There are no active contraindications to surgery such as ongoing infection or rapidly progressive neurological disease.   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 Dizziness, Cough. Dry Mouth   Family History  Hypertension. mother Diabetes Mellitus. mother Rheumatoid Arthritis. brother Kidney disease. mother   Social History Drug/Alcohol Rehab (Currently). no Current work status. working full time Exercise. Exercises weekly; does  individual sport Living situation. live alone Illicit drug use. no Alcohol use. current drinker; drinks beer and hard liquor; only occasionally per week Copy of Drug/Alcohol Rehab (Previously). no Marital status. divorced Pain Contract. no Number of flights of stairs before winded. 2-3 Children. 2 Tobacco use. former smoker; smoke(d) 3/4 pack(s) per day Tobacco / smoke exposure. yes outdoors only Post-Surgical Plans. Plan is to go to SNF. Wants to look into [facility]. Advance Directives. Livig Will, Healthcare POA   Medication History Lisinopril ( Oral) Specific dose unknown - Active. Janumet XR ( Oral) Specific dose unknown - Active. Altace (5MG Capsule, Oral) Active. Tylenol Extra Strength ( Oral) Specific dose unknown - Active.   Past Surgical History Back Surgery. Date: 2009. Rotator Cuff Repair - Right. Date: 2007. Appendectomy Laminectomy Finger Surgery Total Knee Replacement - Left. Date: 02/2012.   Medical History Gastroesophageal Reflux Disease Sleep Apnea Obesity Benign Prostatic Hypertrophy B12 Deficiency Colonic Polyps Hypertension Shingles Rheumatoid Arthritis Anemia   Review of Systems General:Not Present- Chills, Fever, Night Sweats, Fatigue, Weight Gain, Weight Loss and Memory Loss. Skin:Not Present- Hives, Itching, Rash, Eczema and Lesions. HEENT:Not Present- Tinnitus, Headache, Double Vision, Visual Loss, Hearing Loss and Dentures. Respiratory:Not Present- Shortness of breath with exertion, Shortness of breath at rest, Allergies, Coughing up blood and Chronic Cough. Cardiovascular:Not Present- Chest Pain, Racing/skipping heartbeats, Difficulty Breathing Lying Down, Murmur, Swelling and Palpitations. Gastrointestinal:Not Present- Bloody Stool, Heartburn, Abdominal Pain, Vomiting, Nausea, Constipation, Diarrhea, Difficulty Swallowing, Jaundice and Loss of appetitie. Male Genitourinary:Not Present- Urinary frequency,  Blood in Urine, Weak urinary stream, Discharge, Flank Pain, Incontinence, Painful Urination, Urgency, Urinary Retention and Urinating at Night. Musculoskeletal:Present- Joint Pain and Morning Stiffness. Not Present- Muscle Weakness, Muscle Pain, Back Pain   and Spasms. Neurological:Not Present- Tremor, Dizziness, Blackout spells, Paralysis, Difficulty with balance and Weakness. Psychiatric:Not Present- Insomnia.   Vitals Pulse: 72 (Regular) Resp.: 14 (Unlabored) BP: 148/70 (Sitting, Left Arm, Standard)    Physical Exam The physical exam findings are as follows:  Note: Patient is a 67 year old male with continued right knee pain.   General Mental Status - Alert, cooperative and good historian. General Appearance- pleasant. Not in acute distress. Orientation- Oriented X3. Build & Nutrition- Well nourished and Well developed.   Head and Neck Head- normocephalic, atraumatic . Neck Global Assessment- supple. no bruit auscultated on the right and no bruit auscultated on the left.   Eye Pupil- Bilateral- Regular and Round. Motion- Bilateral- EOMI. wears glasses  Chest and Lung Exam Auscultation: Breath sounds:- clear at anterior chest wall and - clear at posterior chest wall. Adventitious sounds:- No Adventitious sounds.   Cardiovascular Auscultation:Rhythm- Regular rate and rhythm. Heart Sounds- S1 WNL and S2 WNL. Murmurs & Other Heart Sounds: Murmur 1:Location- Aortic Area and Pulmonic Area. Timing- Early systolic. Grade- II/VI.   Abdomen Inspection:Contour- Generalized moderate distention. Palpation/Percussion:Tenderness- Abdomen is non-tender to palpation. Rigidity (guarding)- Abdomen is soft. Auscultation:Auscultation of the abdomen reveals - Bowel sounds normal.   Male Genitourinary Not done, not pertinent to present illness  Musculoskeletal  On exam, he's alert and oriented, in no apparent distress. His left knee  looks fantastic. His ROM is 0-125 degrees. No tenderness or instability about the left knee. Right knee range is 5-120. Marked crepitus on ROM. Tender medial greater than lateral. No instability.   RADIOGRAPHS: AP and lateral view of the right knee shows bone on bone changes in the medial and patellofemoral compartment of the right knee.  Assessment & Plan  Primary osteoarthritis of one knee (715.16) Impression: Right Knee  Note: Plan is for a Right Total Knee Replacement by Dr. Aluisio.  Plan is to go to SNF. Wants to look into [facility] again.  The patient does not have any contraindications and will recieve TXA (tranexamic acid) prior to surgery.  Signed electronically by Alexzandrew L Perkins, III PA-C 

## 2012-09-27 ENCOUNTER — Encounter (HOSPITAL_COMMUNITY): Payer: Self-pay | Admitting: Orthopedic Surgery

## 2012-09-27 LAB — BASIC METABOLIC PANEL
CO2: 25 mEq/L (ref 19–32)
Chloride: 98 mEq/L (ref 96–112)
GFR calc Af Amer: >90 mL/min (ref >90)
Potassium: 4.5 mEq/L (ref 3.5–5.1)
Sodium: 130 mEq/L — ABNORMAL LOW (ref 135–145)

## 2012-09-27 LAB — CBC
MCV: 82 fL (ref 78.0–100.0)
Platelets: 128 10*3/uL — ABNORMAL LOW (ref 150–400)
RBC: 3.66 MIL/uL — ABNORMAL LOW (ref 4.22–5.81)
RDW: 14.4 % (ref 11.5–15.5)
WBC: 8.4 10*3/uL (ref 4.0–10.5)

## 2012-09-27 LAB — GLUCOSE, CAPILLARY
Glucose-Capillary: 128 mg/dL — ABNORMAL HIGH (ref 70–99)
Glucose-Capillary: 145 mg/dL — ABNORMAL HIGH (ref 70–99)
Glucose-Capillary: 159 mg/dL — ABNORMAL HIGH (ref 70–99)
Glucose-Capillary: 175 mg/dL — ABNORMAL HIGH (ref 70–99)

## 2012-09-27 MED ORDER — SODIUM CHLORIDE 0.9 % IV SOLN
INTRAVENOUS | Status: DC
Start: 2012-09-27 — End: 2012-09-29
  Administered 2012-09-27 – 2012-09-28 (×2): via INTRAVENOUS

## 2012-09-27 MED ORDER — ALUM & MAG HYDROXIDE-SIMETH 200-200-20 MG/5ML PO SUSP
30.0000 mL | Freq: Four times a day (QID) | ORAL | Status: DC | PRN
  Administered 2012-09-27: 30 mL via ORAL
  Filled 2012-09-27: qty 30

## 2012-09-27 NOTE — Progress Notes (Signed)
Clinical Social Work Department CLINICAL SOCIAL WORK PLACEMENT NOTE 09/27/2012  Patient:  Gregory Estrada, Gregory Estrada  Account Number:  [id number] Admit date:  09/26/2012  Clinical Social Worker:  Cori Razor, LCSW  Date/time:     Clinical Social Work is seeking post-discharge placement for this patient at the following level of care:   SKILLED NURSING   (*CSW will update this form in Epic as items are completed)     Patient/family provided with [facility] Department of Clinical Social Work's list of facilities offering this level of care within the geographic area requested by the patient (or if unable, by the patient's family).  09/27/2012  Patient/family informed of their freedom to choose among providers that offer the needed level of care, that participate in Medicare, Medicaid or managed care program needed by the patient, have an available bed and are willing to accept the patient.    Patient/family informed of [facility]' ownership interest in [facility], as well as of the fact that they are under no obligation to receive care at this facility.  PASARR submitted to EDS on  PASARR number received from EDS on 02/29/2012  FL2 transmitted to all facilities in geographic area requested by pt/family on  09/27/2012 FL2 transmitted to all facilities within larger geographic area on   Patient informed that his/her managed care company has contracts with or will negotiate with  certain facilities, including the following:     Patient/family informed of bed offers received:  09/27/2012 Patient chooses bed at [facility] Physician recommends and patient chooses bed at    Patient to be transferred to [facility] on   Patient to be transferred to facility by   The following physician request were entered in Epic:   Additional Comments: Cori Razor LCSW [phone]

## 2012-09-27 NOTE — Progress Notes (Signed)
Patient continues with nausea,vomited small amount, abdomin distended, pt has had bm and is belching . Dr Darrelyn Hillock answered page with orders noted D Susann Givens RN

## 2012-09-27 NOTE — Progress Notes (Signed)
Physical Therapy Treatment Patient Details Name: Gregory Estrada MRN: [id number] DOB: [birth year] Today's Date: 09/27/2012 Time: 4098-1191 PT Time Calculation (min): 39 min  PT Assessment / Plan / Recommendation Comments on Treatment Session  Pt continuues to be limited by gas pains, nausea. Pt diaphoretic with activity. Unable to attempt to ambulate.     Follow Up Recommendations  SNF     Does the patient have the potential to tolerate intense rehabilitation     Barriers to Discharge        Equipment Recommendations  None recommended by PT    Recommendations for Other Services    Frequency 7X/week   Plan Discharge plan remains appropriate    Precautions / Restrictions Precautions Precautions: Knee Required Braces or Orthoses: Knee Immobilizer - Right Restrictions Weight Bearing Restrictions: No   Pertinent Vitals/Pain Pain is <4 R knee.    Mobility  Bed Mobility Supine to Sit: 3: Mod assist;HOB elevated Sitting - Scoot to Edge of Bed: 4: Min guard Transfers Sit to Stand: 3: Mod assist;With upper extremity assist;From bed;From chair/3-in-1 Stand to Sit: To chair/3-in-1;3: Mod assist;With upper extremity assist Stand Pivot Transfers: 1: +2 Total assist Stand Pivot Transfers: Patient Percentage: 70% Details for Transfer Assistance: pt was sweaty and diaphoretic so did not ambulate   RN aware. BP 132/68 Ambulation/Gait Ambulation/Gait Assistance: 1: +2 Total assist Ambulation/Gait: Patient Percentage: 70% Ambulation Distance (Feet): 5 Feet Assistive device: Rolling walker Ambulation/Gait Assistance Details: Pt limited to [place] then recliner. Pt sweaty. Gait Pattern: Step-to pattern;Antalgic    Exercises Total Joint Exercises Quad Sets: AROM;Right;10 reps Short Arc Quad: AROM;Right;10 reps Heel Slides: AAROM;Right;10 reps Hip ABduction/ADduction: AROM;Right;10 reps Straight Leg Raises: AAROM;Right;10 reps Goniometric ROM: 10-50 flexion   PT Diagnosis:    PT Problem  List:   PT Treatment Interventions:     PT Goals Acute Rehab PT Goals Pt will go Supine/Side to Sit: with supervision PT Goal: Supine/Side to Sit - Progress: Progressing toward goal Pt will go Sit to Stand: with supervision PT Goal: Sit to Stand - Progress: Progressing toward goal Pt will go Stand to Sit: with supervision PT Goal: Stand to Sit - Progress: Progressing toward goal Pt will Ambulate: 51 - 150 feet;with supervision;with rolling walker PT Goal: Ambulate - Progress: Not progressing Pt will Perform Home Exercise Program: with supervision, verbal cues required/provided PT Goal: Perform Home Exercise Program - Progress: Progressing toward goal  Visit Information  Last PT Received On: 09/27/12 Assistance Needed: +2    Subjective Data  Subjective: I have terrible gas/reflux   Cognition  Cognition Arousal/Alertness: Awake/alert    Balance     End of Session PT - End of Session Equipment Utilized During Treatment: Right knee immobilizer Activity Tolerance: Patient limited by fatigue;Treatment limited secondary to medical complications (Comment) Patient left: in chair;with call bell/phone within reach Nurse Communication: Mobility status (feels constipated.)   GP     Rada Hay 09/27/2012, 11:35 AM

## 2012-09-27 NOTE — Progress Notes (Signed)
   Subjective: 1 Day Post-Op Procedure(s) (LRB): RIGHT TOTAL KNEE ARTHROPLASTY (Right) Patient reports pain as mild and moderate.   Patient seen in rounds with Dr. Lequita Halt. Patient is well, and has had no acute complaints or problems We will start therapy today.  Plan is to go [facility] after hospital stay.  Objective: Vital signs in last 24 hours: Temp:  [97.2 F (36.2 C)-98.4 F (36.9 C)] 97.7 F (36.5 C) (06/10 0606) Pulse Rate:  [64-76] 68 (06/10 0606) Resp:  [12-20] 18 (06/10 0606) BP: (105-145)/(62-82) 108/67 mmHg (06/10 0606) SpO2:  [100 %] 100 % (06/10 0606) FiO2 (%):  [100 %] 100 % (06/09 1215) Weight:  [139.254 kg (307 lb)] 139.254 kg (307 lb) (06/09 1215)  Intake/Output from previous day:  Intake/Output Summary (Last 24 hours) at 09/27/12 0747 Last data filed at 09/27/12 0744  Gross per 24 hour  Intake 4438.33 ml  Output   2247 ml  Net 2191.33 ml    Intake/Output this shift: Total I/O In: -  Out: 1 [Urine:1]  Labs:  Recent Labs  09/27/12 0420  HGB 9.5*    Recent Labs  09/27/12 0420  WBC 8.4  RBC 3.66*  HCT 30.0*  PLT 128*    Recent Labs  09/27/12 0420  NA 130*  K 4.5  CL 98  CO2 25  BUN 20  CREATININE 0.94  GLUCOSE 133*  CALCIUM 8.4   No results found for this basename: LABPT, INR,  in the last 72 hours  EXAM General - Patient is Alert, Appropriate and Oriented Extremity - Neurovascular intact Sensation intact distally Dorsiflexion/Plantar flexion intact Dressing - dressing C/D/I Motor Function - intact, moving foot and toes well on exam.  Hemovac pulled without difficulty.  Past Medical History  Diagnosis Date  . Back pain   . Hypertension   . Arthritis   . Prostate enlargement   . Anemia     B 12 DEFICIENCY  . Diabetes mellitus without complication   . Sleep apnea     USES C-PAP    Assessment/Plan: 1 Day Post-Op Procedure(s) (LRB): RIGHT TOTAL KNEE ARTHROPLASTY (Right) Active Problems:   OA (osteoarthritis)  of knee   Postop Acute blood loss anemia   Hyponatremia  Estimated body mass index is 41.63 kg/(m^2) as calculated from the following:   Height as of this encounter: 6' (1.829 m).   Weight as of this encounter: 139.254 kg (307 lb). Advance diet Up with therapy Discharge to SNF  DVT Prophylaxis - Xarelto Weight-Bearing as tolerated to right leg No vaccines. D/C O2 and Pulse OX and try on Room [address]  Gregory Estrada 09/27/2012, 7:47 AM

## 2012-09-27 NOTE — Progress Notes (Signed)
Clinical Social Work Department BRIEF PSYCHOSOCIAL ASSESSMENT 09/27/2012  Patient:  Gregory Estrada, Gregory Estrada     Account Number:  [id number]     Admit date:  09/26/2012  Clinical Social Worker:  Candie Chroman  Date/Time:  09/27/2012 08:47 AM  Referred by:  Physician  Date Referred:  09/27/2012 Referred for  SNF Placement   Other Referral:   Interview type:  Patient Other interview type:    PSYCHOSOCIAL DATA Living Status:  ALONE Admitted from facility:   Level of care:   Primary support name:  [name] Primary support relationship to patient:  CHILD, ADULT Degree of support available:   supportive    CURRENT CONCERNS Current Concerns  Post-Acute Placement   Other Concerns:    SOCIAL WORK ASSESSMENT / PLAN Pt is a 67 yr old gentleman living at home prior to hospitalization. CSW met with pt to assist with d/c planning. Pt has made prior arrangements to have ST Rehab at [facility] following hospital d/c. CSW has contacted SNF and d/c plan has been confirmed. CSW will follow to assist with d/c planning to SNF.   Assessment/plan status:  Psychosocial Support/Ongoing Assessment of Needs Other assessment/ plan:   Information/referral to community resources:   None needed at this time.    PATIENT'S/FAMILY'S RESPONSE TO PLAN OF CARE: Pt is looking forward to having rehab at [facility].   Cori Razor LCSW [phone]

## 2012-09-28 LAB — BASIC METABOLIC PANEL
CO2: 25 mEq/L (ref 19–32)
Chloride: 101 mEq/L (ref 96–112)
GFR calc Af Amer: >90 mL/min (ref >90)
Sodium: 134 mEq/L — ABNORMAL LOW (ref 135–145)

## 2012-09-28 LAB — GLUCOSE, CAPILLARY
Glucose-Capillary: 128 mg/dL — ABNORMAL HIGH (ref 70–99)
Glucose-Capillary: 127 mg/dL — ABNORMAL HIGH (ref 70–99)
Glucose-Capillary: 112 mg/dL — ABNORMAL HIGH (ref 70–99)
Glucose-Capillary: 124 mg/dL — ABNORMAL HIGH (ref 70–99)

## 2012-09-28 LAB — CBC
Platelets: 145 10*3/uL — ABNORMAL LOW (ref 150–400)
RBC: 3.95 MIL/uL — ABNORMAL LOW (ref 4.22–5.81)
RDW: 14.2 % (ref 11.5–15.5)
WBC: 10.5 10*3/uL (ref 4.0–10.5)

## 2012-09-28 MED ORDER — BISACODYL 10 MG RE SUPP
10.0000 mg | Freq: Once | RECTAL | Status: AC
  Administered 2012-09-28: 10 mg via RECTAL

## 2012-09-28 MED ORDER — METOCLOPRAMIDE HCL 5 MG/ML IJ SOLN
10.0000 mg | Freq: Four times a day (QID) | INTRAMUSCULAR | Status: AC
  Administered 2012-09-28 (×3): 10 mg via INTRAVENOUS
  Filled 2012-09-28 (×2): qty 2

## 2012-09-28 NOTE — Progress Notes (Signed)
Physical Therapy Treatment Patient Details Name: Gregory Estrada MRN: [id number] DOB: June 04, 1945 Today's Date: 09/28/2012 Time: 4098-1191 PT Time Calculation (min): 26 min  PT Assessment / Plan / Recommendation Comments on Treatment Session  Pt is feeling much better in regards to GI issues. Pt plans snf tomorrow.    Follow Up Recommendations  SNF     Does the patient have the potential to tolerate intense rehabilitation     Barriers to Discharge        Equipment Recommendations  None recommended by PT    Recommendations for Other Services    Frequency 7X/week   Plan Discharge plan remains appropriate    Precautions / Restrictions Precautions Precautions: Knee   Pertinent Vitals/Pain Sore, no pain. Pt is taking only tylenol    Mobility  Bed Mobility Supine to Sit: 4: Min assist;HOB elevated;With rails Sitting - Scoot to Edge of Bed: 5: Supervision Transfers Sit to Stand: From chair/3-in-1;With upper extremity assist;4: Min guard;From bed Stand to Sit: With upper extremity assist;4: Min guard;To chair/3-in-1 Details for Transfer Assistance: pt tolerated activity much better, not sweaty. Ambulation/Gait Ambulation/Gait Assistance: 4: Min guard Ambulation Distance (Feet): 150 Feet Assistive device: Rolling walker Ambulation/Gait Assistance Details: does well w/out ki Gait Pattern: Step-through pattern;Decreased stride length    Exercises Total Joint Exercises Quad Sets: AROM;Right;10 reps Short Arc Quad: AROM;Right;10 reps Heel Slides: AAROM;Right;10 reps Hip ABduction/ADduction: AROM;Right;10 reps Straight Leg Raises: AAROM;Right;10 reps   PT Diagnosis:    PT Problem List:   PT Treatment Interventions:     PT Goals Acute Rehab PT Goals Pt will go Supine/Side to Sit: with supervision PT Goal: Supine/Side to Sit - Progress: Progressing toward goal Pt will go Sit to Stand: with supervision PT Goal: Sit to Stand - Progress: Progressing toward goal Pt will go  Stand to Sit: with supervision PT Goal: Stand to Sit - Progress: Progressing toward goal Pt will Ambulate: 51 - 150 feet;with supervision;with rolling walker PT Goal: Ambulate - Progress: Progressing toward goal Pt will Perform Home Exercise Program: with supervision, verbal cues required/provided PT Goal: Perform Home Exercise Program - Progress: Progressing toward goal  Visit Information  Last PT Received On: 09/28/12 Assistance Needed: +1    Subjective Data  Subjective: I am much better.   Cognition  Cognition Arousal/Alertness: Awake/alert    Balance     End of Session PT - End of Session Activity Tolerance: Patient tolerated treatment well Patient left: in chair;with call bell/phone within reach   GP     Rada Hay 09/28/2012, 1:23 PM

## 2012-09-28 NOTE — Progress Notes (Signed)
Patient brought home CPAP. Checked plug for any problems and none were found. Patient tolerated well.

## 2012-09-28 NOTE — Care Management Note (Signed)
    Page 1 of 1   09/28/2012     3:39:47 PM   CARE MANAGEMENT NOTE 09/28/2012  Patient:  Gregory Estrada, Gregory Estrada   Account Number:  [id number]  Date Initiated:  09/28/2012  Documentation initiated by:  Colleen Can  Subjective/Objective Assessment:   DX TOTAL RT KNEE REPLACEMNT     Action/Plan:   PLANS FOR SNF REHAB   Anticipated DC Date:  09/29/2012   Anticipated DC Plan:  SKILLED NURSING FACILITY  In-house referral  Clinical Social Worker      DC Planning Services  CM consult      Choice offered to / List presented to:             Status of service:  Completed, signed off Medicare Important Message given?  NA - LOS <3 / Initial given by admissions (If response is "NO", the following Medicare IM given date fields will be blank) Date Medicare IM given:   Date Additional Medicare IM given:    Discharge Disposition:    Per UR Regulation:    If discussed at Long Length of Stay Meetings, dates discussed:    Comments:

## 2012-09-28 NOTE — Progress Notes (Signed)
Physical Therapy Treatment Patient Details Name: Gregory Estrada MRN: [id number] DOB: Apr 10, 1946 Today's Date: 09/28/2012 Time: 6578-4696 PT Time Calculation (min): 23 min  PT Assessment / Plan / Recommendation Comments on Treatment Session  Pt continues to improve. Less Abd, discomfort.    Follow Up Recommendations  SNF     Does the patient have the potential to tolerate intense rehabilitation     Barriers to Discharge        Equipment Recommendations  None recommended by PT    Recommendations for Other Services    Frequency 7X/week   Plan Discharge plan remains appropriate    Precautions / Restrictions Precautions Precautions: Knee   Pertinent Vitals/Pain "Stiff" R knee    Mobility  Bed Mobility Supine to Sit: 4: Min assist;HOB elevated;With rails Sitting - Scoot to Edge of Bed: 5: Supervision Sit to Supine: 4: Min assist;HOB flat Details for Bed Mobility Assistance: assist for RLE onto bed. Transfers Sit to Stand: With upper extremity assist;4: Min guard;From chair/3-in-1 Stand to Sit: With upper extremity assist;4: Min guard;To bed Details for Transfer Assistance: pt tolerated activity much better, not sweaty. Ambulation/Gait Ambulation/Gait Assistance: 4: Min guard Ambulation Distance (Feet): 200 Feet Assistive device: Rolling walker Ambulation/Gait Assistance Details: does well w/out ki Gait Pattern: Step-through pattern;Decreased stride length    Exercises Total Joint Exercises Quad Sets: AROM;Right;10 reps Short Arc Quad: AROM;Right;10 reps Heel Slides: AAROM;Right;10 reps Hip ABduction/ADduction: AROM;Right;10 reps Straight Leg Raises: AAROM;Right;10 reps   PT Diagnosis:    PT Problem List:   PT Treatment Interventions:     PT Goals Acute Rehab PT Goals Pt will go Supine/Side to Sit: with supervision PT Goal: Supine/Side to Sit - Progress: Progressing toward goal Pt will go Sit to Supine/Side: with supervision PT Goal: Sit to Supine/Side -  Progress: Progressing toward goal Pt will go Sit to Stand: with supervision PT Goal: Sit to Stand - Progress: Progressing toward goal Pt will go Stand to Sit: with supervision PT Goal: Stand to Sit - Progress: Progressing toward goal Pt will Ambulate: with supervision;with rolling walker;>150 feet PT Goal: Ambulate - Progress: Progressing toward goal Pt will Perform Home Exercise Program: with supervision, verbal cues required/provided PT Goal: Perform Home Exercise Program - Progress: Progressing toward goal  Visit Information  Last PT Received On: 09/28/12 Assistance Needed: +1    Subjective Data  Subjective: My son will take me to [place]   Cognition  Cognition Arousal/Alertness: Awake/alert    Balance     End of Session PT - End of Session Activity Tolerance: Patient tolerated treatment well Patient left: with call bell/phone within reach;in bed   GP     Rada Hay 09/28/2012, 4:16 PM

## 2012-09-28 NOTE — Progress Notes (Signed)
OT Cancellation/Screen Note  Patient Details Name: Gregory Estrada MRN: [id number] DOB: November 02, 1945   Cancelled Treatment:    Reason Eval/Treat Not Completed: Other (comment)  Will defer OT to snf.    [name] 09/28/2012, 9:30 AM Marica Otter, OTR/L [phone] 09/28/2012

## 2012-09-28 NOTE — Progress Notes (Signed)
   Subjective: 2 Days Post-Op Procedure(s) (LRB): RIGHT TOTAL KNEE ARTHROPLASTY (Right) Patient reports pain as mild.   Patient is having problems with constipation, decreased appetite and nausea/vomiting Plan is to go Skilled nursing facility after hospital stay.  Objective: Vital signs in last 24 hours: Temp:  [97.9 F (36.6 C)-98.7 F (37.1 C)] 98.7 F (37.1 C) (06/11 0609) Pulse Rate:  [70-87] 87 (06/11 0609) Resp:  [16-18] 18 (06/11 0751) BP: (127-166)/(69-78) 166/77 mmHg (06/11 0609) SpO2:  [96 %-99 %] 98 % (06/11 0609)  Intake/Output from previous day:  Intake/Output Summary (Last 24 hours) at 09/28/12 0810 Last data filed at 09/28/12 0609  Gross per 24 hour  Intake 1823.33 ml  Output   3425 ml  Net -1601.67 ml    Intake/Output this shift:    Labs:  Recent Labs  09/27/12 0420 09/28/12 0428  HGB 9.5* 10.4*    Recent Labs  09/27/12 0420 09/28/12 0428  WBC 8.4 10.5  RBC 3.66* 3.95*  HCT 30.0* 31.8*  PLT 128* 145*    Recent Labs  09/27/12 0420 09/28/12 0428  NA 130* 134*  K 4.5 4.1  CL 98 101  CO2 25 25  BUN 20 13  CREATININE 0.94 0.78  GLUCOSE 133* 178*  CALCIUM 8.4 8.9   No results found for this basename: LABPT, INR,  in the last 72 hours  EXAM General - Patient is Alert, Appropriate and Oriented Extremity - Neurologically intact ABD soft Neurovascular intact Incision: dressing C/D/I No cellulitis present Compartment soft Abdomen slightly distended, non-tender, has passed flatus this AM Dressing/Incision - clean, dry, no drainage Motor Function - intact, moving foot and toes well on exam.   Past Medical History  Diagnosis Date  . Back pain   . Hypertension   . Arthritis   . Prostate enlargement   . Anemia     B 12 DEFICIENCY  . Diabetes mellitus without complication   . Sleep apnea     USES C-PAP    Assessment/Plan: 2 Days Post-Op Procedure(s) (LRB): RIGHT TOTAL KNEE ARTHROPLASTY (Right) Active Problems:   OA  (osteoarthritis) of knee   Postop Acute blood loss anemia   Hyponatremia   Up with therapy Discharge to SNF possibly tomorrow Will give suppository this AM and start Reglan. Patient feels less distended than yesterday. Advance diet once his abdomen improves  DVT Prophylaxis - Xarelto Weight-Bearing as tolerated to right leg  Gregory Estrada V 09/28/2012, 8:10 AM

## 2012-09-29 DIAGNOSIS — N401 Enlarged prostate with lower urinary tract symptoms: Secondary | ICD-10-CM | POA: Diagnosis not present

## 2012-09-29 DIAGNOSIS — Z96659 Presence of unspecified artificial knee joint: Secondary | ICD-10-CM | POA: Diagnosis not present

## 2012-09-29 DIAGNOSIS — I10 Essential (primary) hypertension: Secondary | ICD-10-CM | POA: Diagnosis not present

## 2012-09-29 DIAGNOSIS — N400 Enlarged prostate without lower urinary tract symptoms: Secondary | ICD-10-CM | POA: Diagnosis not present

## 2012-09-29 DIAGNOSIS — G4730 Sleep apnea, unspecified: Secondary | ICD-10-CM | POA: Diagnosis not present

## 2012-09-29 DIAGNOSIS — D62 Acute posthemorrhagic anemia: Secondary | ICD-10-CM | POA: Diagnosis not present

## 2012-09-29 DIAGNOSIS — M1710 Unilateral primary osteoarthritis, unspecified knee: Secondary | ICD-10-CM | POA: Diagnosis not present

## 2012-09-29 DIAGNOSIS — K5900 Constipation, unspecified: Secondary | ICD-10-CM | POA: Diagnosis not present

## 2012-09-29 DIAGNOSIS — R609 Edema, unspecified: Secondary | ICD-10-CM | POA: Diagnosis not present

## 2012-09-29 DIAGNOSIS — M1990 Unspecified osteoarthritis, unspecified site: Secondary | ICD-10-CM | POA: Diagnosis not present

## 2012-09-29 DIAGNOSIS — M6281 Muscle weakness (generalized): Secondary | ICD-10-CM | POA: Diagnosis not present

## 2012-09-29 DIAGNOSIS — Z471 Aftercare following joint replacement surgery: Secondary | ICD-10-CM | POA: Diagnosis not present

## 2012-09-29 DIAGNOSIS — E119 Type 2 diabetes mellitus without complications: Secondary | ICD-10-CM | POA: Diagnosis not present

## 2012-09-29 DIAGNOSIS — R269 Unspecified abnormalities of gait and mobility: Secondary | ICD-10-CM | POA: Diagnosis not present

## 2012-09-29 LAB — CBC
MCHC: 32.5 g/dL (ref 30.0–36.0)
RDW: 14.6 % (ref 11.5–15.5)

## 2012-09-29 MED ORDER — DSS 100 MG PO CAPS: 100.0000 mg | ORAL_CAPSULE | Freq: Two times a day (BID) | ORAL | Status: DC

## 2012-09-29 MED ORDER — DIPHENHYDRAMINE HCL 12.5 MG/5ML PO ELIX: 12.5000 mg | ORAL_SOLUTION | ORAL | Status: DC | PRN

## 2012-09-29 MED ORDER — ALUM & MAG HYDROXIDE-SIMETH 200-200-20 MG/5ML PO SUSP: 30.0000 mL | Freq: Four times a day (QID) | ORAL | Status: DC | PRN

## 2012-09-29 MED ORDER — BISACODYL 10 MG RE SUPP: 10.0000 mg | Freq: Every day | RECTAL | Status: DC | PRN

## 2012-09-29 MED ORDER — METOCLOPRAMIDE HCL 5 MG/ML IJ SOLN: 5.0000 mg | Freq: Three times a day (TID) | INTRAMUSCULAR | Status: DC | PRN

## 2012-09-29 MED ORDER — ACETAMINOPHEN 325 MG PO TABS: 650.0000 mg | ORAL_TABLET | Freq: Four times a day (QID) | ORAL | Status: DC | PRN

## 2012-09-29 MED ORDER — METHOCARBAMOL 500 MG PO TABS: 500.0000 mg | ORAL_TABLET | Freq: Four times a day (QID) | ORAL | Status: DC | PRN

## 2012-09-29 MED ORDER — POLYETHYLENE GLYCOL 3350 17 G PO PACK: 17.0000 g | PACK | Freq: Every day | ORAL | Status: DC | PRN

## 2012-09-29 MED ORDER — RIVAROXABAN 10 MG PO TABS: 10.0000 mg | ORAL_TABLET | Freq: Every day | ORAL | Status: DC

## 2012-09-29 MED ORDER — TRAMADOL HCL 50 MG PO TABS: 50.0000 mg | ORAL_TABLET | Freq: Four times a day (QID) | ORAL | Status: DC | PRN

## 2012-09-29 MED ORDER — METOCLOPRAMIDE HCL 10 MG PO TABS: 5.0000 mg | ORAL_TABLET | Freq: Four times a day (QID) | ORAL | Status: DC | PRN

## 2012-09-29 MED ORDER — ONDANSETRON HCL 4 MG PO TABS: 4.0000 mg | ORAL_TABLET | Freq: Four times a day (QID) | ORAL | Status: DC | PRN

## 2012-09-29 MED ORDER — OXYCODONE HCL 5 MG PO TABS: 5.0000 mg | ORAL_TABLET | ORAL | Status: DC | PRN

## 2012-09-29 MED ORDER — METOCLOPRAMIDE HCL 5 MG PO TABS: 5.0000 mg | ORAL_TABLET | Freq: Four times a day (QID) | ORAL | Status: DC | PRN

## 2012-09-29 NOTE — Progress Notes (Signed)
   Subjective: 3 Days Post-Op Procedure(s) (LRB): RIGHT TOTAL KNEE ARTHROPLASTY (Right) Patient reports pain as mild.   Patient seen in rounds with Dr. Lequita Halt. Patient is well, and has had no acute complaints or problems Patient is ready to go to the SNF  Objective: Vital signs in last 24 hours: Temp:  [98.1 F (36.7 C)-99 F (37.2 C)] 98.1 F (36.7 C) (06/12 0507) Pulse Rate:  [82-88] 88 (06/12 0507) Resp:  [18-20] 20 (06/12 0507) BP: (115-135)/(68-71) 115/68 mmHg (06/12 0507) SpO2:  [95 %-98 %] 95 % (06/12 0507)  Intake/Output from previous day:  Intake/Output Summary (Last 24 hours) at 09/29/12 0703 Last data filed at 09/29/12 0226  Gross per 24 hour  Intake    495 ml  Output    450 ml  Net     45 ml    Intake/Output this shift:    Labs:  Recent Labs  09/27/12 0420 09/28/12 0428 09/29/12 0450  HGB 9.5* 10.4* 9.2*    Recent Labs  09/28/12 0428 09/29/12 0450  WBC 10.5 7.0  RBC 3.95* 3.53*  HCT 31.8* 28.3*  PLT 145* 131*    Recent Labs  09/27/12 0420 09/28/12 0428  NA 130* 134*  K 4.5 4.1  CL 98 101  CO2 25 25  BUN 20 13  CREATININE 0.94 0.78  GLUCOSE 133* 178*  CALCIUM 8.4 8.9   No results found for this basename: LABPT, INR,  in the last 72 hours  EXAM: General - Patient is Alert, Appropriate and Oriented Extremity - Neurovascular intact Sensation intact distally Dorsiflexion/Plantar flexion intact No cellulitis present Incision - clean, dry, no drainage, healing Motor Function - intact, moving foot and toes well on exam.   Assessment/Plan: 3 Days Post-Op Procedure(s) (LRB): RIGHT TOTAL KNEE ARTHROPLASTY (Right) Procedure(s) (LRB): RIGHT TOTAL KNEE ARTHROPLASTY (Right) Past Medical History  Diagnosis Date  . Back pain   . Hypertension   . Arthritis   . Prostate enlargement   . Anemia     B 12 DEFICIENCY  . Diabetes mellitus without complication   . Sleep apnea     USES C-PAP   Active Problems:   OA (osteoarthritis) of  knee   Postop Acute blood loss anemia   Hyponatremia  Estimated body mass index is 41.63 kg/(m^2) as calculated from the following:   Height as of this encounter: 6' (1.829 m).   Weight as of this encounter: 139.254 kg (307 lb). Up with therapy Discharge to SNF Diet - Regular diet Follow up - in 2 weeks Activity - WBAT Disposition - Skilled nursing facility Condition Upon Discharge - Good D/C Meds - See DC Summary DVT Prophylaxis - Xarelto  Gregory Estrada 09/29/2012, 7:03 AM

## 2012-09-29 NOTE — Progress Notes (Signed)
Clinical Social Work Department CLINICAL SOCIAL WORK PLACEMENT NOTE 09/29/2012  Patient:  Gregory Estrada, Gregory Estrada  Account Number:  [id number] Admit date:  09/26/2012  Clinical Social Worker:  Cori Razor, LCSW  Date/time:     Clinical Social Work is seeking post-discharge placement for this patient at the following level of care:   SKILLED NURSING   (*CSW will update this form in Epic as items are completed)     Patient/family provided with [facility] Department of Clinical Social Work's list of facilities offering this level of care within the geographic area requested by the patient (or if unable, by the patient's family).  09/27/2012  Patient/family informed of their freedom to choose among providers that offer the needed level of care, that participate in Medicare, Medicaid or managed care program needed by the patient, have an available bed and are willing to accept the patient.    Patient/family informed of [facility]' ownership interest in [facility], as well as of the fact that they are under no obligation to receive care at this facility.  PASARR submitted to [employer] on  PASARR number received from [employer] on 02/29/2012  FL2 transmitted to all facilities in geographic area requested by pt/family on  09/27/2012 FL2 transmitted to all facilities within larger geographic area on   Patient informed that his/her managed care company has contracts with or will negotiate with  certain facilities, including the following:     Patient/family informed of bed offers received:  09/27/2012 Patient chooses bed at [facility] Physician recommends and patient chooses bed at    Patient to be transferred to [facility] on  09/29/2012 Patient to be transferred to facility by FAMILY  The following physician request were entered in Epic:   Additional Comments:  Cori Razor LCSW [phone]

## 2012-09-29 NOTE — Progress Notes (Signed)
Physical Therapy Treatment Patient Details Name: Gregory Estrada MRN: [id number] DOB: 10-29-1945 Today's Date: 09/29/2012 Time: 4098-1191 PT Time Calculation (min): 24 min  PT Assessment / Plan / Recommendation Comments on Treatment Session  SNF today. family will transport per pt.improved ROM and strength. still unable to perform SLR but very close.     Follow Up Recommendations  SNF     Does the patient have the potential to tolerate intense rehabilitation     Barriers to Discharge        Equipment Recommendations       Recommendations for Other Services    Frequency     Plan Discharge plan remains appropriate    Precautions / Restrictions Precautions Precautions: Knee   Pertinent Vitals/Pain 3-4 R knee    Mobility       Exercises Total Joint Exercises Quad Sets: AROM;Right;10 reps Short Arc Quad: AROM;Right;10 reps Hip ABduction/ADduction: AROM;Right;10 reps Straight Leg Raises: AAROM;Right;10 reps Goniometric ROM: 10-60 knee flexion   PT Diagnosis:    PT Problem List:   PT Treatment Interventions:     PT Goals Acute Rehab PT Goals Pt will Perform Home Exercise Program: with supervision, verbal cues required/provided PT Goal: Perform Home Exercise Program - Progress: Progressing toward goal  Visit Information  Last PT Received On: 09/29/12 Assistance Needed: +1    Subjective Data  Subjective: I need some tylenol   Cognition  Cognition Arousal/Alertness: Awake/alert    Balance     End of Session PT - End of Session Activity Tolerance: Patient tolerated treatment well Patient left: with call bell/phone within reach;in bed RN notified for tylenol request    GP     Rada Hay 09/29/2012, 9:08 AM

## 2012-09-29 NOTE — Discharge Summary (Signed)
Physician Discharge Summary   Patient ID: Gregory Estrada MRN: [id number] DOB/AGE: December 21, 1945 67 y.o.  Admit date: 09/26/2012 Discharge date: 09/29/2012  Primary Diagnosis:  Osteoarthritis Right knee  Admission Diagnoses:  Past Medical History  Diagnosis Date  . Back pain   . Hypertension   . Arthritis   . Prostate enlargement   . Anemia     B 12 DEFICIENCY  . Diabetes mellitus without complication   . Sleep apnea     USES C-PAP   Discharge Diagnoses:   Active Problems:   OA (osteoarthritis) of knee   Postop Acute blood loss anemia   Hyponatremia  Estimated body mass index is 41.63 kg/(m^2) as calculated from the following:   Height as of this encounter: 6' (1.829 m).   Weight as of this encounter: 139.254 kg (307 lb).  Procedure:  Procedure(s) (LRB): RIGHT TOTAL KNEE ARTHROPLASTY (Right)   Consults: None  HPI: [name] is a 68 y.o. year old male with end stage OA of his right knee with progressively worsening pain and dysfunction. He has constant pain, with activity and at rest and significant functional deficits with difficulties even with ADLs. He has had extensive non-op management including analgesics, injections of cortisone and viscosupplements, and home exercise program, but remains in significant pain with significant dysfunction. Radiographs show bone on bone arthritis medial and patellofemoral. He presents now for right Total Knee Arthroplasty.   Laboratory Data: Admission on 09/26/2012  Component Date Value Range Status  . ABO/RH(D) 09/26/2012 A POS   Final  . Antibody Screen 09/26/2012 NEG   Final  . Sample Expiration 09/26/2012 09/29/2012   Final  . Glucose-Capillary 09/26/2012 132* 70 - 99 mg/dL Final  . Comment 1 13/11/6576 Documented in Chart   Final  . Glucose-Capillary 09/26/2012 109* 70 - 99 mg/dL Final  . Comment 1 46/96/2952 Documented in Chart   Final  . Comment 2 09/26/2012 Notify RN   Final  . Glucose-Capillary 09/26/2012 101* 70 - 99  mg/dL Final  . Glucose-Capillary 09/26/2012 129* 70 - 99 mg/dL Final  . WBC 84/13/2440 8.4  4.0 - 10.5 K/uL Final  . RBC 09/27/2012 3.66* 4.22 - 5.81 MIL/uL Final  . Hemoglobin 09/27/2012 9.5* 13.0 - 17.0 g/dL Final  . HCT 02/14/2535 30.0* 39.0 - 52.0 % Final  . MCV 09/27/2012 82.0  78.0 - 100.0 fL Final  . MCH 09/27/2012 26.0  26.0 - 34.0 pg Final  . MCHC 09/27/2012 31.7  30.0 - 36.0 g/dL Final  . RDW 64/40/3474 14.4  11.5 - 15.5 % Final  . Platelets 09/27/2012 128* 150 - 400 K/uL Final  . Sodium 09/27/2012 130* 135 - 145 mEq/L Final  . Potassium 09/27/2012 4.5  3.5 - 5.1 mEq/L Final  . Chloride 09/27/2012 98  96 - 112 mEq/L Final  . CO2 09/27/2012 25  19 - 32 mEq/L Final  . Glucose, Bld 09/27/2012 133* 70 - 99 mg/dL Final  . BUN 25/95/6387 20  6 - 23 mg/dL Final  . Creatinine, Ser 09/27/2012 0.94  0.50 - 1.35 mg/dL Final  . Calcium 56/43/3295 8.4  8.4 - 10.5 mg/dL Final  . GFR calc non Af Amer 09/27/2012 85* >90 mL/min Final  . GFR calc Af Amer 09/27/2012 >90  >90 mL/min Final   Comment:                                 The eGFR  has been calculated                          using the CKD EPI equation.                          This calculation has not been                          validated in all clinical                          situations.                          eGFR's persistently                          <90 mL/min signify                          possible Chronic Kidney Disease.  . Glucose-Capillary 09/26/2012 145* 70 - 99 mg/dL Final  . Glucose-Capillary 09/27/2012 128* 70 - 99 mg/dL Final  . Glucose-Capillary 09/27/2012 145* 70 - 99 mg/dL Final  . WBC 16/01/9603 10.5  4.0 - 10.5 K/uL Final  . RBC 09/28/2012 3.95* 4.22 - 5.81 MIL/uL Final  . Hemoglobin 09/28/2012 10.4* 13.0 - 17.0 g/dL Final  . HCT 54/12/8117 31.8* 39.0 - 52.0 % Final  . MCV 09/28/2012 80.5  78.0 - 100.0 fL Final  . MCH 09/28/2012 26.3  26.0 - 34.0 pg Final  . MCHC 09/28/2012 32.7  30.0 - 36.0 g/dL  Final  . RDW 14/78/2956 14.2  11.5 - 15.5 % Final  . Platelets 09/28/2012 145* 150 - 400 K/uL Final  . Sodium 09/28/2012 134* 135 - 145 mEq/L Final  . Potassium 09/28/2012 4.1  3.5 - 5.1 mEq/L Final  . Chloride 09/28/2012 101  96 - 112 mEq/L Final  . CO2 09/28/2012 25  19 - 32 mEq/L Final  . Glucose, Bld 09/28/2012 178* 70 - 99 mg/dL Final  . BUN 21/30/8657 13  6 - 23 mg/dL Final  . Creatinine, Ser 09/28/2012 0.78  0.50 - 1.35 mg/dL Final  . Calcium 84/69/6295 8.9  8.4 - 10.5 mg/dL Final  . GFR calc non Af Amer 09/28/2012 >90  >90 mL/min Final  . GFR calc Af Amer 09/28/2012 >90  >90 mL/min Final   Comment:                                 The eGFR has been calculated                          using the CKD EPI equation.                          This calculation has not been                          validated in all clinical                          situations.  eGFR's persistently                          <90 mL/min signify                          possible Chronic Kidney Disease.  . Glucose-Capillary 09/27/2012 159* 70 - 99 mg/dL Final  . Glucose-Capillary 09/27/2012 175* 70 - 99 mg/dL Final  . Glucose-Capillary 09/28/2012 128* 70 - 99 mg/dL Final  . Comment 1 16/01/9603 Notify RN   Final  . Comment 2 09/28/2012 Documented in Chart   Final  . Glucose-Capillary 09/28/2012 127* 70 - 99 mg/dL Final  . WBC 54/12/8117 7.0  4.0 - 10.5 K/uL Final  . RBC 09/29/2012 3.53* 4.22 - 5.81 MIL/uL Final  . Hemoglobin 09/29/2012 9.2* 13.0 - 17.0 g/dL Final  . HCT 14/78/2956 28.3* 39.0 - 52.0 % Final  . MCV 09/29/2012 80.2  78.0 - 100.0 fL Final  . MCH 09/29/2012 26.1  26.0 - 34.0 pg Final  . MCHC 09/29/2012 32.5  30.0 - 36.0 g/dL Final  . RDW 21/30/8657 14.6  11.5 - 15.5 % Final  . Platelets 09/29/2012 131* 150 - 400 K/uL Final  . Glucose-Capillary 09/28/2012 112* 70 - 99 mg/dL Final  . Glucose-Capillary 09/28/2012 124* 70 - 99 mg/dL Final  . Glucose-Capillary 09/29/2012  143* 70 - 99 mg/dL Final  Hospital Outpatient Visit on 09/20/2012  Component Date Value Range Status  . MRSA, PCR 09/20/2012 NEGATIVE  NEGATIVE Final  . Staphylococcus aureus 09/20/2012 NEGATIVE  NEGATIVE Final   Comment:                                 The Xpert SA Assay (FDA                          approved for NASAL specimens                          in patients over 35 years of age),                          is one component of                          a comprehensive surveillance                          program.  Test performance has                          been validated by [employer] for patients greater                          than or equal to 92 year old.                          It is not intended  to diagnose infection nor to                          guide or monitor treatment.  Marland Kitchen aPTT 09/20/2012 30  24 - 37 seconds Final  . WBC 09/20/2012 7.8  4.0 - 10.5 K/uL Final  . RBC 09/20/2012 4.58  4.22 - 5.81 MIL/uL Final  . Hemoglobin 09/20/2012 12.1* 13.0 - 17.0 g/dL Final  . HCT 16/01/9603 37.5* 39.0 - 52.0 % Final  . MCV 09/20/2012 81.9  78.0 - 100.0 fL Final  . MCH 09/20/2012 26.4  26.0 - 34.0 pg Final  . MCHC 09/20/2012 32.3  30.0 - 36.0 g/dL Final  . RDW 54/12/8117 14.4  11.5 - 15.5 % Final  . Platelets 09/20/2012 177  150 - 400 K/uL Final  . Sodium 09/20/2012 134* 135 - 145 mEq/L Final  . Potassium 09/20/2012 4.7  3.5 - 5.1 mEq/L Final  . Chloride 09/20/2012 101  96 - 112 mEq/L Final  . CO2 09/20/2012 24  19 - 32 mEq/L Final  . Glucose, Bld 09/20/2012 141* 70 - 99 mg/dL Final  . BUN 14/78/2956 19  6 - 23 mg/dL Final  . Creatinine, Ser 09/20/2012 0.97  0.50 - 1.35 mg/dL Final  . Calcium 21/30/8657 9.8  8.4 - 10.5 mg/dL Final  . Total Protein 09/20/2012 7.1  6.0 - 8.3 g/dL Final  . Albumin 84/69/6295 3.7  3.5 - 5.2 g/dL Final  . AST 28/41/3244 19  0 - 37 U/L Final  . ALT 09/20/2012 29  0 - 53 U/L Final  . Alkaline  Phosphatase 09/20/2012 104  39 - 117 U/L Final  . Total Bilirubin 09/20/2012 0.3  0.3 - 1.2 mg/dL Final  . GFR calc non Af Amer 09/20/2012 84* >90 mL/min Final  . GFR calc Af Amer 09/20/2012 >90  >90 mL/min Final   Comment:                                 The eGFR has been calculated                          using the CKD EPI equation.                          This calculation has not been                          validated in all clinical                          situations.                          eGFR's persistently                          <90 mL/min signify                          possible Chronic Kidney Disease.  Marland Kitchen Prothrombin Time 09/20/2012 12.9  11.6 - 15.2 seconds Final  . INR 09/20/2012 0.98  0.00 - 1.49 Final  . Color, Urine 09/20/2012 YELLOW  YELLOW Final  . APPearance 09/20/2012 CLEAR  CLEAR Final  . Specific Gravity, Urine 09/20/2012 1.020  1.005 - 1.030 Final  . pH 09/20/2012 5.0  5.0 - 8.0 Final  . Glucose, UA 09/20/2012 NEGATIVE  NEGATIVE mg/dL Final  . Hgb urine dipstick 09/20/2012 TRACE* NEGATIVE Final  . Bilirubin Urine 09/20/2012 NEGATIVE  NEGATIVE Final  . Ketones, ur 09/20/2012 NEGATIVE  NEGATIVE mg/dL Final  . Protein, ur 57/84/6962 NEGATIVE  NEGATIVE mg/dL Final  . Urobilinogen, UA 09/20/2012 0.2  0.0 - 1.0 mg/dL Final  . Nitrite 95/28/4132 NEGATIVE  NEGATIVE Final  . Leukocytes, UA 09/20/2012 NEGATIVE  NEGATIVE Final  . Squamous Epithelial / LPF 09/20/2012 FEW* RARE Final  . RBC / HPF 09/20/2012 7-10  <3 RBC/hpf Final     X-Rays:No results found.  EKG:No orders found for this or any previous visit.   Hospital Course: Gregory Estrada is a 67 y.o. who was admitted to [facility]. They were brought to the operating room on 09/26/2012 and underwent Procedure(s): RIGHT TOTAL KNEE ARTHROPLASTY.  Patient tolerated the procedure well and was later transferred to the recovery room and then to the orthopaedic floor for postoperative care.  They were given  PO and IV analgesics for pain control following their surgery.  They were given 24 hours of postoperative antibiotics of  Anti-infectives   Start     Dose/Rate Route Frequency Ordered Stop   09/26/12 1530  ceFAZolin (ANCEF) IVPB 2 g/50 mL premix     2 g 100 mL/hr over 30 Minutes Intravenous Every 6 hours 09/26/12 1231 09/26/12 2117   09/26/12 0641  ceFAZolin (ANCEF) 3 g in dextrose 5 % 50 mL IVPB     3 g 160 mL/hr over 30 Minutes Intravenous On call to O.R. 09/26/12 4401 09/26/12 0272     and started on DVT prophylaxis in the form of Xarelto.   PT and OT were ordered for total joint protocol.  Discharge planning consulted to help with postop disposition and equipment needs.  Patient had a tough night on the evening of surgery.  They started to get up OOB with therapy on day one. Hemovac drain was pulled without difficulty.  Continued to work with therapy into day two.  Dressing was changed on day two and the incision was healing well.  By day three, the patient had progressed with therapy and meeting their goals.  Incision was healing well.  Patient was seen in rounds and was ready to go to the SNF.   Discharge Medications: Prior to Admission medications   Medication Sig Start Date End Date Taking? Authorizing Provider  doxazosin (CARDURA) 4 MG tablet Take 4 mg by mouth every evening.   Yes Historical Provider, MD  lisinopril (PRINIVIL,ZESTRIL) 10 MG tablet Take 10 mg by mouth every morning.    Yes Historical Provider, MD  methylcellulose (ARTIFICIAL TEARS) 1 % ophthalmic solution Place 1 drop into both eyes daily as needed (for dry eyes).   Yes Historical Provider, MD  Saxagliptin-Metformin (KOMBIGLYZE XR) 2.08-998 MG TB24 Take 2 tablets by mouth at bedtime.    Yes Historical Provider, MD  acetaminophen (TYLENOL) 325 MG tablet Take 2 tablets (650 mg total) by mouth every 6 (six) hours as needed. 09/29/12   Alexzandrew Perkins, PA-C  alum & mag hydroxide-simeth (MAALOX/MYLANTA) 200-200-20 MG/5ML  suspension Take 30 mLs by mouth every 6 (six) hours as needed. 09/29/12   Alexzandrew Julien Girt, PA-C  bisacodyl (DULCOLAX) 10 MG suppository Place 1 suppository (10 mg total) rectally daily as needed. 09/29/12  Alexzandrew Julien Girt, PA-C  diphenhydrAMINE (BENADRYL) 12.5 MG/5ML elixir Take 5-10 mLs (12.5-25 mg total) by mouth every 4 (four) hours as needed for itching. 09/29/12   Alexzandrew Perkins, PA-C  docusate sodium 100 MG CAPS Take 100 mg by mouth 2 (two) times daily. 09/29/12   Alexzandrew Julien Girt, PA-C  methocarbamol (ROBAXIN) 500 MG tablet Take 1 tablet (500 mg total) by mouth every 6 (six) hours as needed. 09/29/12   Alexzandrew Julien Girt, PA-C  metoCLOPramide (REGLAN) 5 MG tablet Take 1-2 tablets (5-10 mg total) by mouth every 6 (six) hours as needed (if ondansetron (ZOFRAN) ineffective.). 09/29/12   Alexzandrew Perkins, PA-C  ondansetron (ZOFRAN) 4 MG tablet Take 1 tablet (4 mg total) by mouth every 6 (six) hours as needed for nausea. 09/29/12   Alexzandrew Perkins, PA-C  oxyCODONE (OXY IR/ROXICODONE) 5 MG immediate release tablet Take 1-2 tablets (5-10 mg total) by mouth every 3 (three) hours as needed. 09/29/12   Alexzandrew Perkins, PA-C  polyethylene glycol (MIRALAX / GLYCOLAX) packet Take 17 g by mouth daily as needed. 09/29/12   Alexzandrew Julien Girt, PA-C  rivaroxaban (XARELTO) 10 MG TABS tablet Take 1 tablet (10 mg total) by mouth daily with breakfast. Take Xarelto for two and a half more weeks, then discontinue Xarelto. 09/29/12   Alexzandrew Perkins, PA-C  traMADol (ULTRAM) 50 MG tablet Take 1-2 tablets (50-100 mg total) by mouth every 6 (six) hours as needed (mild pain). 09/29/12   Alexzandrew Julien Girt, PA-C    Diet: Cardiac diet and Diabetic diet Activity:WBAT Follow-up:in 2 weeks Disposition - Skilled nursing facility - [facility] Discharged Condition: good   Discharge Orders   Future Orders Complete By Expires     Call MD / Call 911  As directed     Comments:      If you experience  chest pain or shortness of breath, CALL 911 and be transported to the hospital emergency room.  If you develope a fever above 101 F, pus (white drainage) or increased drainage or redness at the wound, or calf pain, call your surgeon's office.    Change dressing  As directed     Comments:      Change dressing daily with sterile 4 x 4 inch gauze dressing and apply TED hose. Do not submerge the incision under water.    Constipation Prevention  As directed     Comments:      Drink plenty of fluids.  Prune juice may be helpful.  You may use a stool softener, such as Colace (over the counter) 100 mg twice a day.  Use MiraLax (over the counter) for constipation as needed.    Diet - low sodium heart healthy  As directed     Diet Carb Modified  As directed     Discharge instructions  As directed     Comments:      Pick up stool softner and laxative for home. Do not submerge incision under water. May shower. Continue to use ice for pain and swelling from surgery.  Take Xarelto for two and a half more weeks, then discontinue Xarelto.  When discharged from the skilled rehab facility, please have the facility set up the patient's Home Health Physical Therapy prior to being released.  Also provide the patient with their medications at time of release from the facility to include their pain medication, the muscle relaxants, and their blood thinner medication.  If the patient is still at the rehab facility at time of follow up appointment, please also  assist the patient in arranging follow up appointment in our office and any transportation needs.    Do not put a pillow under the knee. Place it under the heel.  As directed     Do not sit on low chairs, stoools or toilet seats, as it may be difficult to get up from low surfaces  As directed     Driving restrictions  As directed     Comments:      No driving until released by the physician.    Increase activity slowly as tolerated  As directed     Lifting  restrictions  As directed     Comments:      No lifting until released by the physician.    Patient may shower  As directed     Comments:      You may shower without a dressing once there is no drainage.  Do not wash over the wound.  If drainage remains, do not shower until drainage stops.    TED hose  As directed     Comments:      Use stockings (TED hose) for 3 weeks on both leg(s).  You may remove them at night for sleeping.    Weight bearing as tolerated  As directed         Medication List    STOP taking these medications       ciprofloxacin 500 MG/5ML (10%) suspension  Commonly known as:  CIPRO     cyanocobalamin 1000 MCG/ML injection  Commonly known as:  (VITAMIN B-12)      TAKE these medications       acetaminophen 325 MG tablet  Commonly known as:  TYLENOL  Take 2 tablets (650 mg total) by mouth every 6 (six) hours as needed.     alum & mag hydroxide-simeth 200-200-20 MG/5ML suspension  Commonly known as:  MAALOX/MYLANTA  Take 30 mLs by mouth every 6 (six) hours as needed.     bisacodyl 10 MG suppository  Commonly known as:  DULCOLAX  Place 1 suppository (10 mg total) rectally daily as needed.     diphenhydrAMINE 12.5 MG/5ML elixir  Commonly known as:  BENADRYL  Take 5-10 mLs (12.5-25 mg total) by mouth every 4 (four) hours as needed for itching.     doxazosin 4 MG tablet  Commonly known as:  CARDURA  Take 4 mg by mouth every evening.     DSS 100 MG Caps  Take 100 mg by mouth 2 (two) times daily.     KOMBIGLYZE XR 2.08-998 MG Tb24  Generic drug:  Saxagliptin-Metformin  Take 2 tablets by mouth at bedtime.     lisinopril 10 MG tablet  Commonly known as:  PRINIVIL,ZESTRIL  Take 10 mg by mouth every morning.     methocarbamol 500 MG tablet  Commonly known as:  ROBAXIN  Take 1 tablet (500 mg total) by mouth every 6 (six) hours as needed.     methylcellulose 1 % ophthalmic solution  Commonly known as:  ARTIFICIAL TEARS  Place 1 drop into both eyes  daily as needed (for dry eyes).     metoCLOPramide 5 MG tablet  Commonly known as:  REGLAN  Take 1-2 tablets (5-10 mg total) by mouth every 6 (six) hours as needed (if ondansetron (ZOFRAN) ineffective.).     ondansetron 4 MG tablet  Commonly known as:  ZOFRAN  Take 1 tablet (4 mg total) by mouth every 6 (six) hours as needed for nausea.  oxyCODONE 5 MG immediate release tablet  Commonly known as:  Oxy IR/ROXICODONE  Take 1-2 tablets (5-10 mg total) by mouth every 3 (three) hours as needed.     polyethylene glycol packet  Commonly known as:  MIRALAX / GLYCOLAX  Take 17 g by mouth daily as needed.     rivaroxaban 10 MG Tabs tablet  Commonly known as:  XARELTO  Take 1 tablet (10 mg total) by mouth daily with breakfast. Take Xarelto for two and a half more weeks, then discontinue Xarelto.     traMADol 50 MG tablet  Commonly known as:  ULTRAM  Take 1-2 tablets (50-100 mg total) by mouth every 6 (six) hours as needed (mild pain).           Follow-up Information   Follow up with Loanne Drilling, MD. Schedule an appointment as soon as possible for a visit on 10/11/2012.   Contact information:   [address] [place] [address] [phone]       Signed: Patrica Duel 09/29/2012, 8:19 AM

## 2012-09-29 NOTE — Progress Notes (Signed)
Physical Therapy Treatment Patient Details Name: Gregory Estrada MRN: [id number] DOB: April 08, 1946 Today's Date: 09/29/2012 Time: 4098-1191 PT Time Calculation (min): 16 min  PT Assessment / Plan / Recommendation Comments on Treatment Session  SNF today. family will transport per pt.improved ROM and strength. still unable to perform SLR but very close.     Follow Up Recommendations  SNF     Does the patient have the potential to tolerate intense rehabilitation     Barriers to Discharge        Equipment Recommendations       Recommendations for Other Services    Frequency     Plan Discharge plan remains appropriate    Precautions / Restrictions Precautions Precautions: Knee   Pertinent Vitals/Pain     Mobility  Transfers Sit to Stand: 5: Supervision;From chair/3-in-1;With armrests Stand to Sit: With upper extremity assist;To chair/3-in-1;5: Supervision Ambulation/Gait Ambulation/Gait Assistance: 4: Min guard Ambulation Distance (Feet): 200 Feet Assistive device: Rolling walker Gait Pattern: Step-through pattern;Decreased stride length    Exercises Total Joint Exercises Quad Sets: AROM;Right;10 reps Short Arc Quad: AROM;Right;10 reps Hip ABduction/ADduction: AROM;Right;10 reps Straight Leg Raises: AAROM;Right;10 reps Goniometric ROM: 10-60 knee flexion   PT Diagnosis:    PT Problem List:   PT Treatment Interventions:     PT Goals Acute Rehab PT Goals Pt will go Sit to Stand: with supervision PT Goal: Sit to Stand - Progress: Met Pt will go Stand to Sit: with supervision PT Goal: Stand to Sit - Progress: Met Pt will Ambulate: with supervision;with rolling walker;>150 feet PT Goal: Ambulate - Progress: Met Pt will Perform Home Exercise Program: with supervision, verbal cues required/provided PT Goal: Perform Home Exercise Program - Progress: Progressing toward goal  Visit Information  Last PT Received On: 09/29/12 Assistance Needed: +1    Subjective Data  Subjective: i am ready   Cognition  Cognition Arousal/Alertness: Awake/alert    Balance     End of Session PT - End of Session Activity Tolerance: Patient tolerated treatment well Patient left: with call bell/phone within reach;in bed   GP     Rada Hay 09/29/2012, 11:24 AM

## 2012-09-30 ENCOUNTER — Other Ambulatory Visit: Payer: Self-pay | Admitting: *Deleted

## 2012-09-30 MED ORDER — OXYCODONE HCL 5 MG PO TABS: ORAL_TABLET | ORAL | Status: DC

## 2012-10-03 ENCOUNTER — Non-Acute Institutional Stay (SKILLED_NURSING_FACILITY): Payer: Medicare Other | Admitting: Adult Health

## 2012-10-03 DIAGNOSIS — K5900 Constipation, unspecified: Secondary | ICD-10-CM

## 2012-10-03 DIAGNOSIS — R609 Edema, unspecified: Secondary | ICD-10-CM | POA: Diagnosis not present

## 2012-10-03 DIAGNOSIS — I10 Essential (primary) hypertension: Secondary | ICD-10-CM

## 2012-10-03 DIAGNOSIS — N400 Enlarged prostate without lower urinary tract symptoms: Secondary | ICD-10-CM | POA: Diagnosis not present

## 2012-10-03 DIAGNOSIS — E119 Type 2 diabetes mellitus without complications: Secondary | ICD-10-CM | POA: Diagnosis not present

## 2012-10-03 DIAGNOSIS — D62 Acute posthemorrhagic anemia: Secondary | ICD-10-CM

## 2012-10-04 ENCOUNTER — Encounter: Payer: Self-pay | Admitting: Adult Health

## 2012-10-04 DIAGNOSIS — E119 Type 2 diabetes mellitus without complications: Secondary | ICD-10-CM | POA: Insufficient documentation

## 2012-10-04 DIAGNOSIS — N400 Enlarged prostate without lower urinary tract symptoms: Secondary | ICD-10-CM | POA: Insufficient documentation

## 2012-10-04 DIAGNOSIS — I10 Essential (primary) hypertension: Secondary | ICD-10-CM | POA: Insufficient documentation

## 2012-10-04 DIAGNOSIS — R609 Edema, unspecified: Secondary | ICD-10-CM | POA: Insufficient documentation

## 2012-10-04 DIAGNOSIS — K5900 Constipation, unspecified: Secondary | ICD-10-CM | POA: Insufficient documentation

## 2012-10-04 NOTE — Progress Notes (Signed)
  Subjective:    Patient ID: Gregory Estrada, male    DOB: 08-14-1945, 67 y.o.   MRN: [id number]  HPI This is a 67 year old male who was admitted to [facility] on 03/03/13 from [facility] with Osteoarthritis S/P right total knee arthroplasty. He has been admitted for a short-term rehabilitation. It was noted that his right lower extremity has edema, 2+. No erythema noted on right knee incision. Noted hgb 8.9 . No complaints of dizziness nor shortness of breath.   Review of Systems  Constitutional: Negative.   HENT: Negative.   Eyes: Negative.   Respiratory: Negative for cough and shortness of breath.   Cardiovascular: Positive for leg swelling. Negative for chest pain.  Gastrointestinal: Negative.   Endocrine: Negative.   Genitourinary: Negative.   Neurological: Negative.   Hematological: Negative for adenopathy. Does not bruise/bleed easily.  Psychiatric/Behavioral: Negative.        Objective:   Physical Exam  Nursing note and vitals reviewed. Constitutional: He is oriented to person, place, and time. He appears well-developed and well-nourished.  HENT:  Head: Normocephalic and atraumatic.  Right Ear: External ear normal.  Left Ear: External ear normal.  Nose: Nose normal.  Mouth/Throat: Oropharynx is clear and moist.  Eyes: Conjunctivae and EOM are normal. Pupils are equal, round, and reactive to light.  Neck: Normal range of motion. Neck supple.  Cardiovascular: Normal rate.   Pulmonary/Chest: Effort normal and breath sounds normal.  Abdominal: Soft. Bowel sounds are normal.  Musculoskeletal: He exhibits edema. He exhibits no tenderness.  RLE edema, 2+  Neurological: He is alert and oriented to person, place, and time.  Skin: Skin is warm and dry.  Psychiatric: He has a normal mood and affect. His behavior is normal. Judgment and thought content normal.   LABS: 09/30/12  Wbc 5.0  hgb 8.9  hct 26.9  NA 134  K 3.5  Glucose 119  BUN 22 creatinine 0.90  Calcium  8.2   Medications reviewed per [facility]     Assessment & Plan:    Edema - for portable doppler ultrasound RLE  BPH without obstruction/lower urinary tract symptoms  Constipation - stable; continue Colace 100 mg PO BID and Miralax 17 gm PO Q D  Diabetes mellitus - well-controlled; continue Kombiglze XR 2.08/998 mg 2 tabs PO Q HS  Postop Acute blood loss anemia - start FeSO4 325 mg 1 tab PO BID and repeat CBC in 1 week  OA (osteoarthritis) of knee - for PT and OT  Hypertension - well-controlled; continue Lisinopril 10 mg PO Q AM

## 2012-10-06 ENCOUNTER — Non-Acute Institutional Stay (SKILLED_NURSING_FACILITY): Payer: Medicare Other | Admitting: Internal Medicine

## 2012-10-06 DIAGNOSIS — E119 Type 2 diabetes mellitus without complications: Secondary | ICD-10-CM | POA: Diagnosis not present

## 2012-10-06 DIAGNOSIS — I10 Essential (primary) hypertension: Secondary | ICD-10-CM | POA: Diagnosis not present

## 2012-10-06 DIAGNOSIS — D62 Acute posthemorrhagic anemia: Secondary | ICD-10-CM | POA: Diagnosis not present

## 2012-10-06 DIAGNOSIS — M1710 Unilateral primary osteoarthritis, unspecified knee: Secondary | ICD-10-CM | POA: Diagnosis not present

## 2012-10-11 ENCOUNTER — Non-Acute Institutional Stay (SKILLED_NURSING_FACILITY): Payer: Medicare Other | Admitting: Adult Health

## 2012-10-11 DIAGNOSIS — D62 Acute posthemorrhagic anemia: Secondary | ICD-10-CM | POA: Diagnosis not present

## 2012-10-11 DIAGNOSIS — M1710 Unilateral primary osteoarthritis, unspecified knee: Secondary | ICD-10-CM

## 2012-10-11 DIAGNOSIS — K5900 Constipation, unspecified: Secondary | ICD-10-CM | POA: Diagnosis not present

## 2012-10-11 DIAGNOSIS — I10 Essential (primary) hypertension: Secondary | ICD-10-CM

## 2012-10-11 DIAGNOSIS — Z471 Aftercare following joint replacement surgery: Secondary | ICD-10-CM | POA: Diagnosis not present

## 2012-10-11 DIAGNOSIS — N400 Enlarged prostate without lower urinary tract symptoms: Secondary | ICD-10-CM | POA: Diagnosis not present

## 2012-10-11 DIAGNOSIS — E119 Type 2 diabetes mellitus without complications: Secondary | ICD-10-CM

## 2012-10-11 DIAGNOSIS — Z96659 Presence of unspecified artificial knee joint: Secondary | ICD-10-CM | POA: Diagnosis not present

## 2012-10-14 ENCOUNTER — Encounter: Payer: Self-pay | Admitting: Adult Health

## 2012-10-14 NOTE — Progress Notes (Signed)
  Subjective:    Patient ID: Gregory Estrada, male    DOB: 1946-04-08, 67 y.o.   MRN: [id number]  HPI  This is a 67 year old male who is for discharge home with outpatient care. He was admitted to [facility] on 03/03/13 from [facility] with Osteoarthritis S/P right total knee arthroplasty. He has been admitted for a short-term rehabilitation.  Review of Systems  Constitutional: Negative.   HENT: Negative.   Eyes: Negative.   Respiratory: Negative for cough and shortness of breath.   Cardiovascular: Positive for leg swelling. Negative for chest pain.  Gastrointestinal: Negative.   Endocrine: Negative.   Genitourinary: Negative.   Neurological: Negative.   Hematological: Negative for adenopathy. Does not bruise/bleed easily.  Psychiatric/Behavioral: Negative.        Objective:   Physical Exam  Nursing note and vitals reviewed. Constitutional: He is oriented to person, place, and time. He appears well-developed and well-nourished.  HENT:  Head: Normocephalic and atraumatic.  Right Ear: External ear normal.  Left Ear: External ear normal.  Nose: Nose normal.  Mouth/Throat: Oropharynx is clear and moist.  Eyes: Conjunctivae and EOM are normal. Pupils are equal, round, and reactive to light.  Neck: Normal range of motion. Neck supple.  Cardiovascular: Normal rate.   Pulmonary/Chest: Effort normal and breath sounds normal.  Abdominal: Soft. Bowel sounds are normal.  Musculoskeletal: He exhibits edema. He exhibits no tenderness.  RLE edema, 1+  Neurological: He is alert and oriented to person, place, and time.  Skin: Skin is warm and dry.  Psychiatric: He has a normal mood and affect. His behavior is normal. Judgment and thought content normal.     LABS: 10/10/12  Wbc 5.3  hgb 9.6  hct 29.6 10/07/12  Iron 41  TIBC 283  hgb 9.2  Ferritin 133 09/30/12  Wbc 5.0  hgb 8.9  hct 26.9  NA 134  K 3.5  Glucose 119  BUN 22 creatinine 0.90  Calcium 8.2   Medications reviewed per  [facility]     Assessment & Plan:    BPH without obstruction/lower urinary tract symptoms  Constipation - stable; continue Colace 100 mg PO BID and Miralax 17 gm PO Q D  Diabetes mellitus - well-controlled; continue Kombiglyze XR 2.08/998 mg 2 tabs PO Q HS  Postop Acute blood loss anemia - start FeSO4 325 mg 1 tab PO BID and repeat CBC in 1 week  OA (osteoarthritis) of knee S/P Right Total Knee Arthroplasty - for outpatient therapy  Hypertension - well-controlled; continue Lisinopril 10 mg PO Q AM

## 2012-10-17 DIAGNOSIS — IMO0001 Reserved for inherently not codable concepts without codable children: Secondary | ICD-10-CM | POA: Diagnosis not present

## 2012-10-17 DIAGNOSIS — M25569 Pain in unspecified knee: Secondary | ICD-10-CM | POA: Diagnosis not present

## 2012-10-19 DIAGNOSIS — M25569 Pain in unspecified knee: Secondary | ICD-10-CM | POA: Diagnosis not present

## 2012-10-19 DIAGNOSIS — M25669 Stiffness of unspecified knee, not elsewhere classified: Secondary | ICD-10-CM | POA: Diagnosis not present

## 2012-10-19 DIAGNOSIS — IMO0001 Reserved for inherently not codable concepts without codable children: Secondary | ICD-10-CM | POA: Diagnosis not present

## 2012-10-20 DIAGNOSIS — M25669 Stiffness of unspecified knee, not elsewhere classified: Secondary | ICD-10-CM | POA: Diagnosis not present

## 2012-10-20 DIAGNOSIS — IMO0001 Reserved for inherently not codable concepts without codable children: Secondary | ICD-10-CM | POA: Diagnosis not present

## 2012-10-20 DIAGNOSIS — M25569 Pain in unspecified knee: Secondary | ICD-10-CM | POA: Diagnosis not present

## 2012-10-24 DIAGNOSIS — M25569 Pain in unspecified knee: Secondary | ICD-10-CM | POA: Diagnosis not present

## 2012-10-24 DIAGNOSIS — IMO0001 Reserved for inherently not codable concepts without codable children: Secondary | ICD-10-CM | POA: Diagnosis not present

## 2012-10-24 DIAGNOSIS — M25669 Stiffness of unspecified knee, not elsewhere classified: Secondary | ICD-10-CM | POA: Diagnosis not present

## 2012-10-26 DIAGNOSIS — M25669 Stiffness of unspecified knee, not elsewhere classified: Secondary | ICD-10-CM | POA: Diagnosis not present

## 2012-10-26 DIAGNOSIS — IMO0001 Reserved for inherently not codable concepts without codable children: Secondary | ICD-10-CM | POA: Diagnosis not present

## 2012-10-26 DIAGNOSIS — M25569 Pain in unspecified knee: Secondary | ICD-10-CM | POA: Diagnosis not present

## 2012-10-28 DIAGNOSIS — IMO0001 Reserved for inherently not codable concepts without codable children: Secondary | ICD-10-CM | POA: Diagnosis not present

## 2012-10-28 DIAGNOSIS — M25569 Pain in unspecified knee: Secondary | ICD-10-CM | POA: Diagnosis not present

## 2012-10-28 DIAGNOSIS — M25669 Stiffness of unspecified knee, not elsewhere classified: Secondary | ICD-10-CM | POA: Diagnosis not present

## 2012-10-31 DIAGNOSIS — M25669 Stiffness of unspecified knee, not elsewhere classified: Secondary | ICD-10-CM | POA: Diagnosis not present

## 2012-10-31 DIAGNOSIS — IMO0001 Reserved for inherently not codable concepts without codable children: Secondary | ICD-10-CM | POA: Diagnosis not present

## 2012-10-31 DIAGNOSIS — M25569 Pain in unspecified knee: Secondary | ICD-10-CM | POA: Diagnosis not present

## 2012-11-01 NOTE — Progress Notes (Signed)
Patient ID: Gregory Estrada, male   DOB: [birth year], 67 y.o.   MRN: [id number]        HISTORY & PHYSICAL  DATE: 10/06/2012   FACILITY: [facility]  LEVEL OF CARE: SNF (31)  ALLERGIES:  Allergies  Allergen Reactions  . Flomax (Tamsulosin) Cough    Dry mouth    CHIEF COMPLAINT:  Manage right knee osteoarthritis, acute blood loss anemia, and hypertension.    HISTORY OF PRESENT ILLNESS:  The patient is a 67 year-old, Caucasian male.  KNEE OSTEOARTHRITIS: Patient had a history of pain and functional disability in the knee due to end-stage osteoarthritis and has failed nonsurgical conservative treatments. Patient had worsening of pain with activity and weight bearing, pain that interfered with activities of daily living & pain with passive range of motion. Therefore patient underwent total knee arthroplasty and tolerated the procedure well. Patient is admitted to this facility for sort short-term rehabilitation. Patient denies knee pain.   ANEMIA:  Postoperatively, patient suffered acute blood loss.   The anemia is unstable. The patient denies fatigue, melena or hematochezia. No complications from the medications currently being used.  Last hemoglobins are:  8.9, 9.5.  HTN: Pt 's HTN remains stable.  Denies CP, sob, DOE, pedal edema, headaches, dizziness or visual disturbances.  No complications from the medications currently being used.  Last BP :  132/67, 117/68, 122/67.  PAST MEDICAL HISTORY :  Past Medical History  Diagnosis Date  . Back pain   . Hypertension   . Arthritis   . Prostate enlargement   . Anemia     B 12 DEFICIENCY  . Diabetes mellitus without complication   . Sleep apnea     USES C-PAP    PAST SURGICAL HISTORY: Past Surgical History  Procedure Laterality Date  . Rotator cuff repair      RT  . Back surgery    . Appendectomy    . Total knee arthroplasty  02/29/2012    Procedure: TOTAL KNEE ARTHROPLASTY;  Surgeon: Loanne Drilling, MD;   Location: [facility];  Service: Orthopedics;  Laterality: Left;  . Refractive surgery  2008    bilateral eyes  . Total knee arthroplasty Right 09/26/2012    Procedure: RIGHT TOTAL KNEE ARTHROPLASTY;  Surgeon: Loanne Drilling, MD;  Location: [facility];  Service: Orthopedics;  Laterality: Right;    SOCIAL HISTORY:  reports that he quit smoking about 31 years ago. He does not have any smokeless tobacco history on file. He reports that he does not drink alcohol or use illicit drugs.  FAMILY HISTORY: None  CURRENT MEDICATIONS: Reviewed per MAR  REVIEW OF SYSTEMS:  See HPI otherwise 14 point ROS is negative.  PHYSICAL EXAMINATION  VS:  T 97.6       P 76      RR 24      BP 132/67      POX%        WT (Lb)  GENERAL: no acute distress, morbidly obese body habitus SKIN: warm & dry, no suspicious lesions or rashes, no excessive dryness EYES: conjunctivae normal, sclerae normal, normal eye lids MOUTH/THROAT: lips without lesions,no lesions in the mouth,tongue is without lesions,uvula elevates in midline NECK: supple, trachea midline, no neck masses, no thyroid tenderness, no thyromegaly LYMPHATICS: no LAN in the neck, no supraclavicular LAN RESPIRATORY: breathing is even & unlabored, BS CTAB CARDIAC: RRR, no murmur,no extra heart sounds EDEMA/VARICOSITIES: right lower extremity has +2 edema ARTERIAL: pedal pulses  diminished  GI:  ABDOMEN: abdomen soft, normal BS, no masses, no tenderness  LIVER/SPLEEN: no hepatomegaly, no splenomegaly MUSCULOSKELETAL: HEAD: normal to inspection & palpation BACK: no kyphosis, scoliosis or spinal processes tenderness EXTREMITIES: LEFT UPPER EXTREMITY: full range of motion, normal strength & tone RIGHT UPPER EXTREMITY:  full range of motion, normal strength & tone LEFT LOWER EXTREMITY: strength intact, range of motion moderate  RIGHT LOWER EXTREMITY: right knee edematous, strength intact, range of motion not tested due to surgery  PSYCHIATRIC: the patient is alert &  oriented to person, affect & behavior appropriate  LABS/RADIOLOGY: Hemoglobin 8.9, MCV 78.9, otherwise CBC normal.    Glucose 119, otherwise BMP normal.    Right knee x-ray showed bone-on-bone arthritis in the medial and patellofemoral compartments.    MRSA by PCR negative.    Staph aureus by PCR negative.    PTT 30.    Liver profile normal.    Urinalysis negative.    ASSESSMENT/PLAN:  Right knee osteoarthritis.  Status post right total knee arthroplasty.  Continue rehabilitation.   Acute blood loss anemia.  Hemoglobin declined.  Iron was started.  Recheck pending.  Hypertension.  Well controlled.    Diabetes mellitus.  Continue current medications.    Sleep apnea.  Continue CPAP.    I have reviewed patient's medical records received at admission/from hospitalization.  CPT CODE: 45409

## 2012-11-02 DIAGNOSIS — M25569 Pain in unspecified knee: Secondary | ICD-10-CM | POA: Diagnosis not present

## 2012-11-02 DIAGNOSIS — IMO0001 Reserved for inherently not codable concepts without codable children: Secondary | ICD-10-CM | POA: Diagnosis not present

## 2012-11-02 DIAGNOSIS — M25669 Stiffness of unspecified knee, not elsewhere classified: Secondary | ICD-10-CM | POA: Diagnosis not present

## 2012-11-04 DIAGNOSIS — Z471 Aftercare following joint replacement surgery: Secondary | ICD-10-CM | POA: Diagnosis not present

## 2012-11-04 DIAGNOSIS — M25569 Pain in unspecified knee: Secondary | ICD-10-CM | POA: Diagnosis not present

## 2012-11-04 DIAGNOSIS — M25669 Stiffness of unspecified knee, not elsewhere classified: Secondary | ICD-10-CM | POA: Diagnosis not present

## 2012-11-04 DIAGNOSIS — IMO0001 Reserved for inherently not codable concepts without codable children: Secondary | ICD-10-CM | POA: Diagnosis not present

## 2012-11-07 DIAGNOSIS — IMO0001 Reserved for inherently not codable concepts without codable children: Secondary | ICD-10-CM | POA: Diagnosis not present

## 2012-11-07 DIAGNOSIS — M25569 Pain in unspecified knee: Secondary | ICD-10-CM | POA: Diagnosis not present

## 2012-11-07 DIAGNOSIS — M25669 Stiffness of unspecified knee, not elsewhere classified: Secondary | ICD-10-CM | POA: Diagnosis not present

## 2012-11-09 DIAGNOSIS — M25669 Stiffness of unspecified knee, not elsewhere classified: Secondary | ICD-10-CM | POA: Diagnosis not present

## 2012-11-09 DIAGNOSIS — IMO0001 Reserved for inherently not codable concepts without codable children: Secondary | ICD-10-CM | POA: Diagnosis not present

## 2012-11-09 DIAGNOSIS — M25569 Pain in unspecified knee: Secondary | ICD-10-CM | POA: Diagnosis not present

## 2012-11-11 DIAGNOSIS — M25569 Pain in unspecified knee: Secondary | ICD-10-CM | POA: Diagnosis not present

## 2012-11-11 DIAGNOSIS — M25669 Stiffness of unspecified knee, not elsewhere classified: Secondary | ICD-10-CM | POA: Diagnosis not present

## 2012-11-11 DIAGNOSIS — IMO0001 Reserved for inherently not codable concepts without codable children: Secondary | ICD-10-CM | POA: Diagnosis not present

## 2012-11-23 DIAGNOSIS — M47817 Spondylosis without myelopathy or radiculopathy, lumbosacral region: Secondary | ICD-10-CM | POA: Diagnosis not present

## 2012-11-23 DIAGNOSIS — M5137 Other intervertebral disc degeneration, lumbosacral region: Secondary | ICD-10-CM | POA: Diagnosis not present

## 2012-11-23 DIAGNOSIS — M48061 Spinal stenosis, lumbar region without neurogenic claudication: Secondary | ICD-10-CM | POA: Diagnosis not present

## 2012-12-07 DIAGNOSIS — M48061 Spinal stenosis, lumbar region without neurogenic claudication: Secondary | ICD-10-CM | POA: Diagnosis not present

## 2012-12-21 DIAGNOSIS — M48061 Spinal stenosis, lumbar region without neurogenic claudication: Secondary | ICD-10-CM | POA: Diagnosis not present

## 2013-01-24 DIAGNOSIS — Z471 Aftercare following joint replacement surgery: Secondary | ICD-10-CM | POA: Diagnosis not present

## 2013-01-25 DIAGNOSIS — M47817 Spondylosis without myelopathy or radiculopathy, lumbosacral region: Secondary | ICD-10-CM | POA: Diagnosis not present

## 2013-01-25 DIAGNOSIS — M545 Low back pain: Secondary | ICD-10-CM | POA: Diagnosis not present

## 2013-01-31 DIAGNOSIS — E782 Mixed hyperlipidemia: Secondary | ICD-10-CM | POA: Diagnosis not present

## 2013-01-31 DIAGNOSIS — E538 Deficiency of other specified B group vitamins: Secondary | ICD-10-CM | POA: Diagnosis not present

## 2013-01-31 DIAGNOSIS — N400 Enlarged prostate without lower urinary tract symptoms: Secondary | ICD-10-CM | POA: Diagnosis not present

## 2013-01-31 DIAGNOSIS — M25569 Pain in unspecified knee: Secondary | ICD-10-CM | POA: Diagnosis not present

## 2013-01-31 DIAGNOSIS — M545 Low back pain: Secondary | ICD-10-CM | POA: Diagnosis not present

## 2013-01-31 DIAGNOSIS — D518 Other vitamin B12 deficiency anemias: Secondary | ICD-10-CM | POA: Diagnosis not present

## 2013-01-31 DIAGNOSIS — Z23 Encounter for immunization: Secondary | ICD-10-CM | POA: Diagnosis not present

## 2013-02-14 DIAGNOSIS — H1820 Unspecified corneal edema: Secondary | ICD-10-CM | POA: Diagnosis not present

## 2013-02-21 DIAGNOSIS — H1820 Unspecified corneal edema: Secondary | ICD-10-CM | POA: Diagnosis not present

## 2013-06-21 DIAGNOSIS — M545 Low back pain, unspecified: Secondary | ICD-10-CM | POA: Diagnosis not present

## 2013-06-21 DIAGNOSIS — N400 Enlarged prostate without lower urinary tract symptoms: Secondary | ICD-10-CM | POA: Diagnosis not present

## 2013-06-21 DIAGNOSIS — N390 Urinary tract infection, site not specified: Secondary | ICD-10-CM | POA: Diagnosis not present

## 2013-07-25 DIAGNOSIS — N3020 Other chronic cystitis without hematuria: Secondary | ICD-10-CM | POA: Diagnosis not present

## 2013-07-25 DIAGNOSIS — R3989 Other symptoms and signs involving the genitourinary system: Secondary | ICD-10-CM | POA: Diagnosis not present

## 2013-07-25 DIAGNOSIS — Z125 Encounter for screening for malignant neoplasm of prostate: Secondary | ICD-10-CM | POA: Diagnosis not present

## 2013-08-01 DIAGNOSIS — M545 Low back pain, unspecified: Secondary | ICD-10-CM | POA: Diagnosis not present

## 2013-08-01 DIAGNOSIS — D518 Other vitamin B12 deficiency anemias: Secondary | ICD-10-CM | POA: Diagnosis not present

## 2013-08-01 DIAGNOSIS — R5381 Other malaise: Secondary | ICD-10-CM | POA: Diagnosis not present

## 2013-08-01 DIAGNOSIS — M25569 Pain in unspecified knee: Secondary | ICD-10-CM | POA: Diagnosis not present

## 2013-08-01 DIAGNOSIS — IMO0001 Reserved for inherently not codable concepts without codable children: Secondary | ICD-10-CM | POA: Diagnosis not present

## 2013-08-01 DIAGNOSIS — N400 Enlarged prostate without lower urinary tract symptoms: Secondary | ICD-10-CM | POA: Diagnosis not present

## 2013-08-01 DIAGNOSIS — R972 Elevated prostate specific antigen [PSA]: Secondary | ICD-10-CM | POA: Diagnosis not present

## 2013-08-01 DIAGNOSIS — I10 Essential (primary) hypertension: Secondary | ICD-10-CM | POA: Diagnosis not present

## 2013-08-03 DIAGNOSIS — N3020 Other chronic cystitis without hematuria: Secondary | ICD-10-CM | POA: Diagnosis not present

## 2013-08-03 DIAGNOSIS — E119 Type 2 diabetes mellitus without complications: Secondary | ICD-10-CM | POA: Diagnosis not present

## 2013-08-03 DIAGNOSIS — H5710 Ocular pain, unspecified eye: Secondary | ICD-10-CM | POA: Diagnosis not present

## 2013-08-03 DIAGNOSIS — K7689 Other specified diseases of liver: Secondary | ICD-10-CM | POA: Diagnosis not present

## 2013-08-07 DIAGNOSIS — M6530 Trigger finger, unspecified finger: Secondary | ICD-10-CM | POA: Diagnosis not present

## 2013-08-08 DIAGNOSIS — R3989 Other symptoms and signs involving the genitourinary system: Secondary | ICD-10-CM | POA: Diagnosis not present

## 2013-08-08 DIAGNOSIS — N3020 Other chronic cystitis without hematuria: Secondary | ICD-10-CM | POA: Diagnosis not present

## 2013-09-12 DIAGNOSIS — K7689 Other specified diseases of liver: Secondary | ICD-10-CM | POA: Diagnosis not present

## 2013-09-12 DIAGNOSIS — Z86010 Personal history of colonic polyps: Secondary | ICD-10-CM | POA: Diagnosis not present

## 2013-09-27 DIAGNOSIS — M47817 Spondylosis without myelopathy or radiculopathy, lumbosacral region: Secondary | ICD-10-CM | POA: Diagnosis not present

## 2013-10-10 DIAGNOSIS — R5381 Other malaise: Secondary | ICD-10-CM | POA: Diagnosis not present

## 2013-10-10 DIAGNOSIS — D649 Anemia, unspecified: Secondary | ICD-10-CM | POA: Diagnosis not present

## 2013-10-10 DIAGNOSIS — J029 Acute pharyngitis, unspecified: Secondary | ICD-10-CM | POA: Diagnosis not present

## 2013-10-10 DIAGNOSIS — R5383 Other fatigue: Secondary | ICD-10-CM | POA: Diagnosis not present

## 2013-10-17 DIAGNOSIS — Z471 Aftercare following joint replacement surgery: Secondary | ICD-10-CM | POA: Diagnosis not present

## 2013-10-17 DIAGNOSIS — Z96659 Presence of unspecified artificial knee joint: Secondary | ICD-10-CM | POA: Diagnosis not present

## 2013-11-10 ENCOUNTER — Other Ambulatory Visit: Payer: Self-pay | Admitting: Gastroenterology

## 2013-11-10 DIAGNOSIS — D126 Benign neoplasm of colon, unspecified: Secondary | ICD-10-CM | POA: Diagnosis not present

## 2013-11-10 DIAGNOSIS — K7689 Other specified diseases of liver: Secondary | ICD-10-CM | POA: Diagnosis not present

## 2013-11-10 DIAGNOSIS — Z09 Encounter for follow-up examination after completed treatment for conditions other than malignant neoplasm: Secondary | ICD-10-CM | POA: Diagnosis not present

## 2013-11-10 DIAGNOSIS — Z86010 Personal history of colonic polyps: Secondary | ICD-10-CM | POA: Diagnosis not present

## 2013-11-21 DIAGNOSIS — M48061 Spinal stenosis, lumbar region without neurogenic claudication: Secondary | ICD-10-CM | POA: Diagnosis not present

## 2013-11-21 DIAGNOSIS — IMO0002 Reserved for concepts with insufficient information to code with codable children: Secondary | ICD-10-CM | POA: Diagnosis not present

## 2013-11-21 DIAGNOSIS — M5416 Radiculopathy, lumbar region: Secondary | ICD-10-CM | POA: Insufficient documentation

## 2013-12-21 DIAGNOSIS — R3989 Other symptoms and signs involving the genitourinary system: Secondary | ICD-10-CM | POA: Diagnosis not present

## 2013-12-21 DIAGNOSIS — N3020 Other chronic cystitis without hematuria: Secondary | ICD-10-CM | POA: Diagnosis not present

## 2014-01-25 DIAGNOSIS — H10502 Unspecified blepharoconjunctivitis, left eye: Secondary | ICD-10-CM | POA: Diagnosis not present

## 2014-02-07 DIAGNOSIS — E119 Type 2 diabetes mellitus without complications: Secondary | ICD-10-CM | POA: Diagnosis not present

## 2014-02-07 DIAGNOSIS — N401 Enlarged prostate with lower urinary tract symptoms: Secondary | ICD-10-CM | POA: Diagnosis not present

## 2014-02-07 DIAGNOSIS — G4730 Sleep apnea, unspecified: Secondary | ICD-10-CM | POA: Diagnosis not present

## 2014-02-07 DIAGNOSIS — I10 Essential (primary) hypertension: Secondary | ICD-10-CM | POA: Diagnosis not present

## 2014-02-07 DIAGNOSIS — Z23 Encounter for immunization: Secondary | ICD-10-CM | POA: Diagnosis not present

## 2014-02-07 DIAGNOSIS — E538 Deficiency of other specified B group vitamins: Secondary | ICD-10-CM | POA: Diagnosis not present

## 2014-02-27 DIAGNOSIS — M65342 Trigger finger, left ring finger: Secondary | ICD-10-CM | POA: Diagnosis not present

## 2014-03-02 NOTE — Progress Notes (Signed)
Pt aware of where to come [facility]

## 2014-03-07 ENCOUNTER — Ambulatory Visit (HOSPITAL_BASED_OUTPATIENT_CLINIC_OR_DEPARTMENT_OTHER): Admission: RE | Admit: 2014-03-07 | Payer: Medicare Other | Source: Ambulatory Visit | Admitting: Orthopedic Surgery

## 2014-03-07 ENCOUNTER — Encounter (HOSPITAL_BASED_OUTPATIENT_CLINIC_OR_DEPARTMENT_OTHER): Admission: RE | Payer: Self-pay | Source: Ambulatory Visit

## 2014-03-07 SURGERY — RELEASE, A1 PULLEY, FOR TRIGGER FINGER
Anesthesia: LOCAL | Laterality: Left

## 2014-03-09 DIAGNOSIS — R300 Dysuria: Secondary | ICD-10-CM | POA: Diagnosis not present

## 2014-03-12 DIAGNOSIS — M65342 Trigger finger, left ring finger: Secondary | ICD-10-CM | POA: Diagnosis not present

## 2014-03-20 ENCOUNTER — Other Ambulatory Visit: Payer: Self-pay | Admitting: Internal Medicine

## 2014-03-20 ENCOUNTER — Ambulatory Visit
Admission: RE | Admit: 2014-03-20 | Discharge: 2014-03-20 | Disposition: A | Discharge status: Home / Self Care | Payer: Medicare Other | Source: Ambulatory Visit | Attending: Internal Medicine | Admitting: Internal Medicine

## 2014-03-20 DIAGNOSIS — Z1389 Encounter for screening for other disorder: Secondary | ICD-10-CM | POA: Diagnosis not present

## 2014-03-20 DIAGNOSIS — R0781 Pleurodynia: Secondary | ICD-10-CM

## 2014-04-18 DIAGNOSIS — H10503 Unspecified blepharoconjunctivitis, bilateral: Secondary | ICD-10-CM | POA: Diagnosis not present

## 2014-05-09 DIAGNOSIS — H04121 Dry eye syndrome of right lacrimal gland: Secondary | ICD-10-CM | POA: Diagnosis not present

## 2014-05-16 DIAGNOSIS — E119 Type 2 diabetes mellitus without complications: Secondary | ICD-10-CM | POA: Diagnosis not present

## 2014-05-28 IMAGING — CR DG LUMBAR SPINE 2-3V
3 series · 3 of 3 positions shown · non-contrast
Comparison: None.

CLINICAL DATA: Low back pain.  History of surgery.

LUMBAR SPINE - 2-3 VIEW

[view not recorded (1 of 3)]
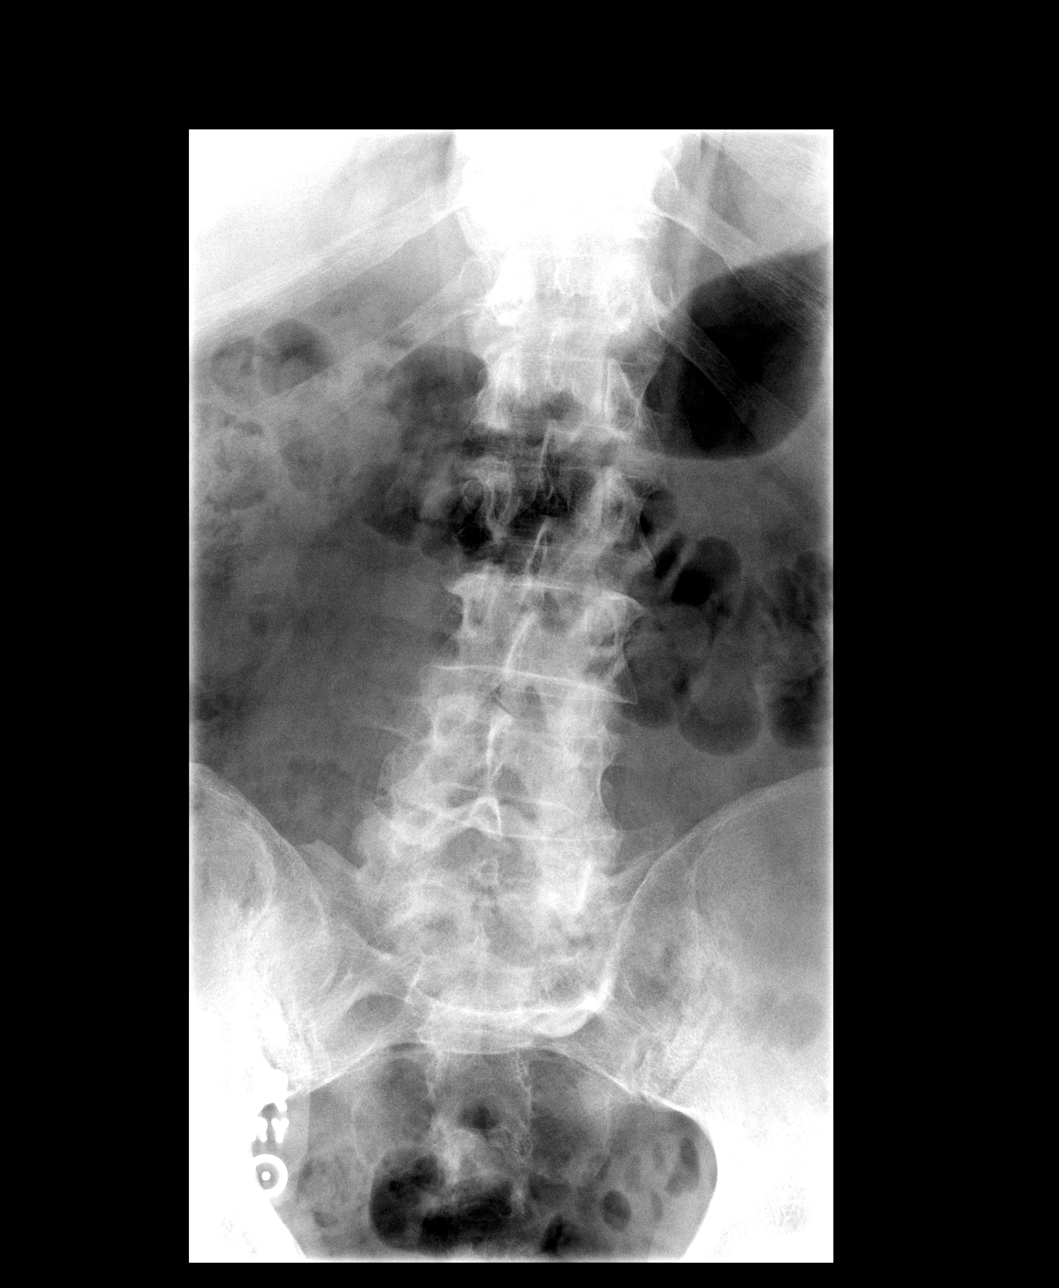

[view not recorded (2 of 3)]
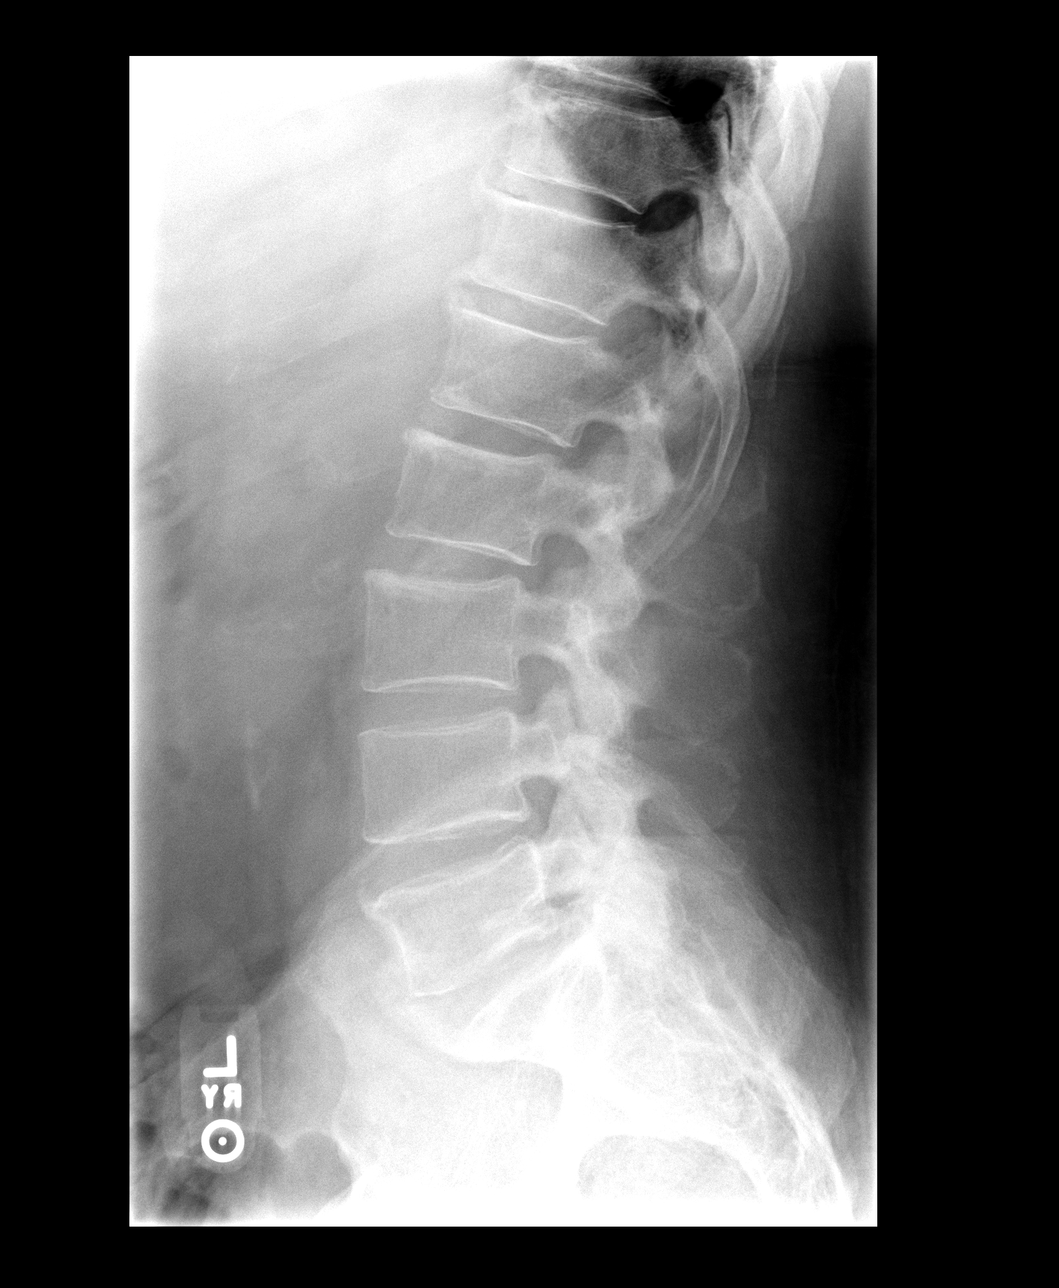

[view not recorded (3 of 3)]
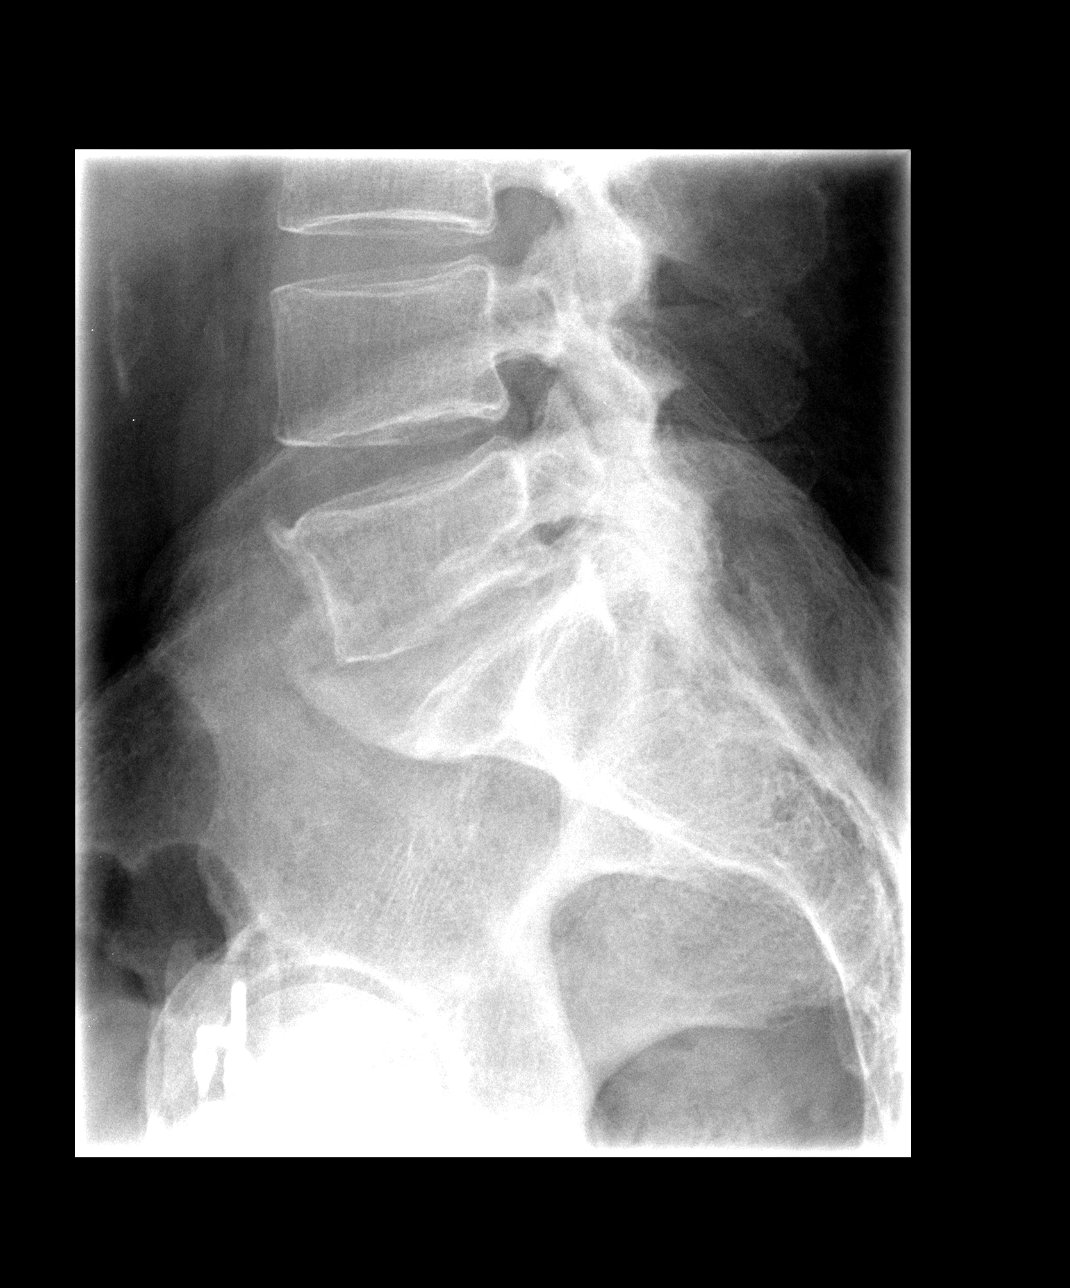

[3 of 3 positions shown; findings below may reference images not displayed]

FINDINGS: There are five lumbar-type vertebral bodies.  There is
mild thoracolumbar curvature convex to the left at the apex at L2.
Disc space heights are normal except for mild narrowing at L2-3, L4-
5 and L5-S1.  There is facet osteoarthritis at L4-5 and L5-S1.  No
evidence of fracture or other focal lesion.
IMPRESSION: Mild curvature.  Mild disc space narrowing at L2-3, L4-5 and L5-S1.
Lower lumbar facet degeneration.

## 2014-06-11 ENCOUNTER — Encounter: Payer: Medicare Other | Attending: Nurse Practitioner | Admitting: Dietician

## 2014-06-11 VITALS — Ht 72.0 in | Wt 314.0 lb

## 2014-06-11 DIAGNOSIS — E119 Type 2 diabetes mellitus without complications: Secondary | ICD-10-CM | POA: Diagnosis not present

## 2014-06-11 DIAGNOSIS — Z713 Dietary counseling and surveillance: Secondary | ICD-10-CM | POA: Diagnosis not present

## 2014-06-11 NOTE — Progress Notes (Signed)
  Medical Nutrition Therapy:  Appt start time: 1030 end time:  0569.   Assessment:  Primary concerns today: Patient wants to improving diet to help control DM. Patient states that he tries for about a week and then is not consistent.  Patient has had type 2 diabetes for about 10 years.  HgbA1C 8.3% 05/16/14.  Patient is now checking his blood sugar twice a day before meals.  Hx also includes HTN and sleep apenea.  He uses c-pap every night and sleeps 8-10 hours.  314 lbs today.    Patient lives alone, cooks and shops for himself.  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  Preferred Learning Style:   No preference indicated   Learning Readiness:   Ready  Change in progress   MEDICATIONS: see list which includes:  Kombiglyze and Glipizide   DIETARY INTAKE: Patient was eating a lot of sweets and has decreased this to almost none.  24-hr recall:  B ( AM): Special K cereal and 2% milk  Snk ( AM): none  L ( PM): out to eat:  Broiled fish or sub ([employer], or subway, or chicken and veges and bread Snk ( PM): none D ( PM): meat, peas or pintos, and veges or sandwich Snk ( PM): occasional peanut butter with 2 slices of  White wheat bread or crackers Beverages: water, used to drink sweet tea, 2 oz OJ  Usual physical activity: shoots sporting clays on weekends (1 mile track) uses golf cart but is gone about 12 hours when he does this.  Patient has an exercise bike and uses most days for 10 minutes.    Estimated energy needs: 2000 calories 225 g carbohydrates 150 g protein 56 g fat  Progress Towards Goal(s):  In progress.   Nutritional Diagnosis:  NB-1.1 Food and nutrition-related knowledge deficit As related to balance of carbohydrate, protein, and fat.  As evidenced by patient report.    Intervention:  Nutrition counseling and diabetes education initiated. Discussed Carb Counting by food group as method of portion  control, reading food labels, and benefits of increased activity. Also discussed target BG ranges pre and post meals, and A1c.   Plan:  Aim for 4 Carb Choices per meal (60 grams) +/- 1 either way  Aim for 0-2 Carbs per snack if hungry  Include protein in moderation with your meals and snacks Consider reading food labels for Total Carbohydrate and Fat Grams of foods Consider  increasing your activity level by walking or biking for 20-30 minutes daily as tolerated   Teaching Method Utilized:  Visual Auditory Hands on  Handouts given during visit include:  My plate  Meal plan card  Label reading  Snack list  Barriers to learning/adherence to lifestyle change: none  Demonstrated degree of understanding via:  Teach Back   Monitoring/Evaluation:  Dietary intake, exercise, label reading, and body weight in 3 month(s).

## 2014-06-11 NOTE — Patient Instructions (Addendum)
Plan:  Aim for 4 Carb Choices per meal (60 grams) +/- 1 either way  Aim for 0-2 Carbs per snack if hungry  Include protein in moderation with your meals and snacks Consider reading food labels for Total Carbohydrate and Fat Grams of foods Consider  increasing your activity level by walking or biking for 20-30 minutes daily as tolerated

## 2014-07-03 DIAGNOSIS — E119 Type 2 diabetes mellitus without complications: Secondary | ICD-10-CM | POA: Diagnosis not present

## 2014-08-08 DIAGNOSIS — H04123 Dry eye syndrome of bilateral lacrimal glands: Secondary | ICD-10-CM | POA: Diagnosis not present

## 2014-08-08 DIAGNOSIS — E119 Type 2 diabetes mellitus without complications: Secondary | ICD-10-CM | POA: Diagnosis not present

## 2014-08-08 DIAGNOSIS — H2513 Age-related nuclear cataract, bilateral: Secondary | ICD-10-CM | POA: Diagnosis not present

## 2014-08-14 DIAGNOSIS — E119 Type 2 diabetes mellitus without complications: Secondary | ICD-10-CM | POA: Diagnosis not present

## 2014-08-14 DIAGNOSIS — K760 Fatty (change of) liver, not elsewhere classified: Secondary | ICD-10-CM | POA: Diagnosis not present

## 2014-08-14 DIAGNOSIS — Z6841 Body Mass Index (BMI) 40.0 and over, adult: Secondary | ICD-10-CM | POA: Diagnosis not present

## 2014-08-14 DIAGNOSIS — Z1389 Encounter for screening for other disorder: Secondary | ICD-10-CM | POA: Diagnosis not present

## 2014-08-14 DIAGNOSIS — N400 Enlarged prostate without lower urinary tract symptoms: Secondary | ICD-10-CM | POA: Diagnosis not present

## 2014-08-14 DIAGNOSIS — I10 Essential (primary) hypertension: Secondary | ICD-10-CM | POA: Diagnosis not present

## 2014-08-14 DIAGNOSIS — D696 Thrombocytopenia, unspecified: Secondary | ICD-10-CM | POA: Diagnosis not present

## 2014-08-14 DIAGNOSIS — Z0000 Encounter for general adult medical examination without abnormal findings: Secondary | ICD-10-CM | POA: Diagnosis not present

## 2014-08-14 DIAGNOSIS — E1165 Type 2 diabetes mellitus with hyperglycemia: Secondary | ICD-10-CM | POA: Diagnosis not present

## 2014-08-14 DIAGNOSIS — G4730 Sleep apnea, unspecified: Secondary | ICD-10-CM | POA: Diagnosis not present

## 2014-08-14 DIAGNOSIS — J309 Allergic rhinitis, unspecified: Secondary | ICD-10-CM | POA: Diagnosis not present

## 2014-08-14 DIAGNOSIS — E538 Deficiency of other specified B group vitamins: Secondary | ICD-10-CM | POA: Diagnosis not present

## 2014-09-11 ENCOUNTER — Ambulatory Visit: Payer: Medicare Other | Admitting: Dietician

## 2014-09-12 DIAGNOSIS — D649 Anemia, unspecified: Secondary | ICD-10-CM | POA: Diagnosis not present

## 2014-10-11 DIAGNOSIS — D696 Thrombocytopenia, unspecified: Secondary | ICD-10-CM | POA: Diagnosis not present

## 2014-10-11 DIAGNOSIS — K760 Fatty (change of) liver, not elsewhere classified: Secondary | ICD-10-CM | POA: Diagnosis not present

## 2014-10-12 ENCOUNTER — Other Ambulatory Visit (HOSPITAL_COMMUNITY): Payer: Self-pay | Admitting: Gastroenterology

## 2014-10-12 DIAGNOSIS — K760 Fatty (change of) liver, not elsewhere classified: Secondary | ICD-10-CM

## 2014-11-07 ENCOUNTER — Telehealth (HOSPITAL_COMMUNITY): Payer: Self-pay

## 2014-11-07 NOTE — Telephone Encounter (Signed)
Called pt to remind him of 9am appt at [facility], pt agreed to stay npo for exam. AW

## 2014-11-08 ENCOUNTER — Ambulatory Visit (HOSPITAL_COMMUNITY)
Admission: RE | Admit: 2014-11-08 | Discharge: 2014-11-08 | Disposition: A | Discharge status: Home / Self Care | Payer: Medicare Other | Source: Ambulatory Visit | Attending: Gastroenterology | Admitting: Gastroenterology

## 2014-11-08 DIAGNOSIS — R161 Splenomegaly, not elsewhere classified: Secondary | ICD-10-CM | POA: Insufficient documentation

## 2014-11-08 DIAGNOSIS — I77811 Abdominal aortic ectasia: Secondary | ICD-10-CM | POA: Diagnosis not present

## 2014-11-08 DIAGNOSIS — K760 Fatty (change of) liver, not elsewhere classified: Secondary | ICD-10-CM | POA: Diagnosis present

## 2014-11-13 DIAGNOSIS — K7581 Nonalcoholic steatohepatitis (NASH): Secondary | ICD-10-CM | POA: Diagnosis not present

## 2014-11-29 DIAGNOSIS — H10502 Unspecified blepharoconjunctivitis, left eye: Secondary | ICD-10-CM | POA: Diagnosis not present

## 2015-01-04 DIAGNOSIS — H43812 Vitreous degeneration, left eye: Secondary | ICD-10-CM | POA: Diagnosis not present

## 2015-02-27 DIAGNOSIS — N401 Enlarged prostate with lower urinary tract symptoms: Secondary | ICD-10-CM | POA: Diagnosis not present

## 2015-02-27 DIAGNOSIS — D696 Thrombocytopenia, unspecified: Secondary | ICD-10-CM | POA: Diagnosis not present

## 2015-02-27 DIAGNOSIS — Z23 Encounter for immunization: Secondary | ICD-10-CM | POA: Diagnosis not present

## 2015-02-27 DIAGNOSIS — K7581 Nonalcoholic steatohepatitis (NASH): Secondary | ICD-10-CM | POA: Diagnosis not present

## 2015-02-27 DIAGNOSIS — E119 Type 2 diabetes mellitus without complications: Secondary | ICD-10-CM | POA: Diagnosis not present

## 2015-02-27 DIAGNOSIS — E538 Deficiency of other specified B group vitamins: Secondary | ICD-10-CM | POA: Diagnosis not present

## 2015-02-27 DIAGNOSIS — I10 Essential (primary) hypertension: Secondary | ICD-10-CM | POA: Diagnosis not present

## 2015-04-01 DIAGNOSIS — H35342 Macular cyst, hole, or pseudohole, left eye: Secondary | ICD-10-CM | POA: Diagnosis not present

## 2015-04-01 DIAGNOSIS — H2513 Age-related nuclear cataract, bilateral: Secondary | ICD-10-CM | POA: Diagnosis not present

## 2015-04-03 DIAGNOSIS — H01006 Unspecified blepharitis left eye, unspecified eyelid: Secondary | ICD-10-CM | POA: Insufficient documentation

## 2015-04-03 DIAGNOSIS — E119 Type 2 diabetes mellitus without complications: Secondary | ICD-10-CM | POA: Diagnosis not present

## 2015-04-03 DIAGNOSIS — H35372 Puckering of macula, left eye: Secondary | ICD-10-CM | POA: Insufficient documentation

## 2015-04-03 DIAGNOSIS — H01004 Unspecified blepharitis left upper eyelid: Secondary | ICD-10-CM | POA: Diagnosis not present

## 2015-04-03 DIAGNOSIS — H01003 Unspecified blepharitis right eye, unspecified eyelid: Secondary | ICD-10-CM | POA: Insufficient documentation

## 2015-04-03 DIAGNOSIS — H35361 Drusen (degenerative) of macula, right eye: Secondary | ICD-10-CM | POA: Diagnosis not present

## 2015-04-03 DIAGNOSIS — H2513 Age-related nuclear cataract, bilateral: Secondary | ICD-10-CM | POA: Diagnosis not present

## 2015-04-03 DIAGNOSIS — Z79899 Other long term (current) drug therapy: Secondary | ICD-10-CM | POA: Diagnosis not present

## 2015-04-03 DIAGNOSIS — H01001 Unspecified blepharitis right upper eyelid: Secondary | ICD-10-CM | POA: Diagnosis not present

## 2015-04-03 DIAGNOSIS — H43813 Vitreous degeneration, bilateral: Secondary | ICD-10-CM | POA: Diagnosis not present

## 2015-04-03 DIAGNOSIS — Z87891 Personal history of nicotine dependence: Secondary | ICD-10-CM | POA: Diagnosis not present

## 2015-04-03 DIAGNOSIS — IMO0002 Reserved for concepts with insufficient information to code with codable children: Secondary | ICD-10-CM | POA: Insufficient documentation

## 2015-04-03 DIAGNOSIS — I10 Essential (primary) hypertension: Secondary | ICD-10-CM | POA: Diagnosis not present

## 2015-04-03 DIAGNOSIS — Z9889 Other specified postprocedural states: Secondary | ICD-10-CM | POA: Diagnosis not present

## 2015-04-08 DIAGNOSIS — M7542 Impingement syndrome of left shoulder: Secondary | ICD-10-CM | POA: Diagnosis not present

## 2015-04-08 DIAGNOSIS — M19012 Primary osteoarthritis, left shoulder: Secondary | ICD-10-CM | POA: Diagnosis not present

## 2015-05-06 DIAGNOSIS — M7542 Impingement syndrome of left shoulder: Secondary | ICD-10-CM | POA: Diagnosis not present

## 2015-05-06 DIAGNOSIS — M19012 Primary osteoarthritis, left shoulder: Secondary | ICD-10-CM | POA: Diagnosis not present

## 2015-05-13 DIAGNOSIS — M461 Sacroiliitis, not elsewhere classified: Secondary | ICD-10-CM | POA: Diagnosis not present

## 2015-05-13 DIAGNOSIS — Z6841 Body Mass Index (BMI) 40.0 and over, adult: Secondary | ICD-10-CM | POA: Diagnosis not present

## 2015-05-13 DIAGNOSIS — M47816 Spondylosis without myelopathy or radiculopathy, lumbar region: Secondary | ICD-10-CM | POA: Diagnosis not present

## 2015-05-15 DIAGNOSIS — M47816 Spondylosis without myelopathy or radiculopathy, lumbar region: Secondary | ICD-10-CM | POA: Diagnosis not present

## 2015-05-15 DIAGNOSIS — I10 Essential (primary) hypertension: Secondary | ICD-10-CM | POA: Diagnosis not present

## 2015-05-15 DIAGNOSIS — Z6841 Body Mass Index (BMI) 40.0 and over, adult: Secondary | ICD-10-CM | POA: Diagnosis not present

## 2015-06-03 DIAGNOSIS — Z6841 Body Mass Index (BMI) 40.0 and over, adult: Secondary | ICD-10-CM | POA: Diagnosis not present

## 2015-06-03 DIAGNOSIS — R635 Abnormal weight gain: Secondary | ICD-10-CM | POA: Diagnosis not present

## 2015-06-03 DIAGNOSIS — K7581 Nonalcoholic steatohepatitis (NASH): Secondary | ICD-10-CM | POA: Diagnosis not present

## 2015-07-03 DIAGNOSIS — I10 Essential (primary) hypertension: Secondary | ICD-10-CM | POA: Diagnosis not present

## 2015-07-03 DIAGNOSIS — Z7984 Long term (current) use of oral hypoglycemic drugs: Secondary | ICD-10-CM | POA: Diagnosis not present

## 2015-07-03 DIAGNOSIS — Z9889 Other specified postprocedural states: Secondary | ICD-10-CM | POA: Diagnosis not present

## 2015-07-03 DIAGNOSIS — H2513 Age-related nuclear cataract, bilateral: Secondary | ICD-10-CM | POA: Diagnosis not present

## 2015-07-03 DIAGNOSIS — H35372 Puckering of macula, left eye: Secondary | ICD-10-CM | POA: Diagnosis not present

## 2015-07-03 DIAGNOSIS — Z87891 Personal history of nicotine dependence: Secondary | ICD-10-CM | POA: Diagnosis not present

## 2015-07-03 DIAGNOSIS — E119 Type 2 diabetes mellitus without complications: Secondary | ICD-10-CM | POA: Diagnosis not present

## 2015-07-03 DIAGNOSIS — Z79899 Other long term (current) drug therapy: Secondary | ICD-10-CM | POA: Diagnosis not present

## 2015-07-16 DIAGNOSIS — M7542 Impingement syndrome of left shoulder: Secondary | ICD-10-CM | POA: Diagnosis not present

## 2015-07-16 DIAGNOSIS — M19012 Primary osteoarthritis, left shoulder: Secondary | ICD-10-CM | POA: Diagnosis not present

## 2015-07-18 DIAGNOSIS — L2084 Intrinsic (allergic) eczema: Secondary | ICD-10-CM | POA: Diagnosis not present

## 2015-07-18 DIAGNOSIS — L853 Xerosis cutis: Secondary | ICD-10-CM | POA: Diagnosis not present

## 2015-07-18 DIAGNOSIS — Z23 Encounter for immunization: Secondary | ICD-10-CM | POA: Diagnosis not present

## 2015-07-18 DIAGNOSIS — H02826 Cysts of left eye, unspecified eyelid: Secondary | ICD-10-CM | POA: Diagnosis not present

## 2015-07-26 DIAGNOSIS — M7542 Impingement syndrome of left shoulder: Secondary | ICD-10-CM | POA: Diagnosis not present

## 2015-07-31 DIAGNOSIS — S46012A Strain of muscle(s) and tendon(s) of the rotator cuff of left shoulder, initial encounter: Secondary | ICD-10-CM | POA: Diagnosis not present

## 2015-07-31 DIAGNOSIS — M19012 Primary osteoarthritis, left shoulder: Secondary | ICD-10-CM | POA: Diagnosis not present

## 2015-08-14 DIAGNOSIS — Z6841 Body Mass Index (BMI) 40.0 and over, adult: Secondary | ICD-10-CM | POA: Diagnosis not present

## 2015-08-14 DIAGNOSIS — M47816 Spondylosis without myelopathy or radiculopathy, lumbar region: Secondary | ICD-10-CM | POA: Diagnosis not present

## 2015-08-15 DIAGNOSIS — M66812 Spontaneous rupture of other tendons, left shoulder: Secondary | ICD-10-CM | POA: Diagnosis not present

## 2015-08-15 DIAGNOSIS — M75102 Unspecified rotator cuff tear or rupture of left shoulder, not specified as traumatic: Secondary | ICD-10-CM | POA: Diagnosis not present

## 2015-08-28 DIAGNOSIS — E538 Deficiency of other specified B group vitamins: Secondary | ICD-10-CM | POA: Diagnosis not present

## 2015-08-28 DIAGNOSIS — K760 Fatty (change of) liver, not elsewhere classified: Secondary | ICD-10-CM | POA: Diagnosis not present

## 2015-08-28 DIAGNOSIS — I10 Essential (primary) hypertension: Secondary | ICD-10-CM | POA: Diagnosis not present

## 2015-08-28 DIAGNOSIS — Z1389 Encounter for screening for other disorder: Secondary | ICD-10-CM | POA: Diagnosis not present

## 2015-08-28 DIAGNOSIS — K219 Gastro-esophageal reflux disease without esophagitis: Secondary | ICD-10-CM | POA: Diagnosis not present

## 2015-08-28 DIAGNOSIS — E7800 Pure hypercholesterolemia, unspecified: Secondary | ICD-10-CM | POA: Diagnosis not present

## 2015-08-28 DIAGNOSIS — E119 Type 2 diabetes mellitus without complications: Secondary | ICD-10-CM | POA: Diagnosis not present

## 2015-08-28 DIAGNOSIS — J309 Allergic rhinitis, unspecified: Secondary | ICD-10-CM | POA: Diagnosis not present

## 2015-08-28 DIAGNOSIS — G4730 Sleep apnea, unspecified: Secondary | ICD-10-CM | POA: Diagnosis not present

## 2015-08-28 DIAGNOSIS — Z0000 Encounter for general adult medical examination without abnormal findings: Secondary | ICD-10-CM | POA: Diagnosis not present

## 2015-08-28 DIAGNOSIS — Z7984 Long term (current) use of oral hypoglycemic drugs: Secondary | ICD-10-CM | POA: Diagnosis not present

## 2015-08-28 DIAGNOSIS — Z6841 Body Mass Index (BMI) 40.0 and over, adult: Secondary | ICD-10-CM | POA: Diagnosis not present

## 2015-08-28 DIAGNOSIS — D696 Thrombocytopenia, unspecified: Secondary | ICD-10-CM | POA: Diagnosis not present

## 2015-08-28 DIAGNOSIS — N400 Enlarged prostate without lower urinary tract symptoms: Secondary | ICD-10-CM | POA: Diagnosis not present

## 2015-09-11 ENCOUNTER — Other Ambulatory Visit: Payer: Self-pay | Admitting: Physical Medicine and Rehabilitation

## 2015-09-11 DIAGNOSIS — M48061 Spinal stenosis, lumbar region without neurogenic claudication: Secondary | ICD-10-CM

## 2015-09-15 ENCOUNTER — Ambulatory Visit
Admission: RE | Admit: 2015-09-15 | Discharge: 2015-09-15 | Disposition: A | Discharge status: Home / Self Care | Payer: BLUE CROSS/BLUE SHIELD | Source: Ambulatory Visit | Attending: Physical Medicine and Rehabilitation | Admitting: Physical Medicine and Rehabilitation

## 2015-09-15 DIAGNOSIS — M48061 Spinal stenosis, lumbar region without neurogenic claudication: Secondary | ICD-10-CM

## 2015-09-15 DIAGNOSIS — M5126 Other intervertebral disc displacement, lumbar region: Secondary | ICD-10-CM | POA: Diagnosis not present

## 2015-09-19 DIAGNOSIS — Z6832 Body mass index (BMI) 32.0-32.9, adult: Secondary | ICD-10-CM | POA: Diagnosis not present

## 2015-09-19 DIAGNOSIS — I10 Essential (primary) hypertension: Secondary | ICD-10-CM | POA: Diagnosis not present

## 2015-09-19 DIAGNOSIS — M461 Sacroiliitis, not elsewhere classified: Secondary | ICD-10-CM | POA: Diagnosis not present

## 2015-09-19 DIAGNOSIS — M47816 Spondylosis without myelopathy or radiculopathy, lumbar region: Secondary | ICD-10-CM | POA: Diagnosis not present

## 2015-10-01 DIAGNOSIS — Z6832 Body mass index (BMI) 32.0-32.9, adult: Secondary | ICD-10-CM | POA: Diagnosis not present

## 2015-10-01 DIAGNOSIS — M461 Sacroiliitis, not elsewhere classified: Secondary | ICD-10-CM | POA: Diagnosis not present

## 2015-10-08 DIAGNOSIS — Z87891 Personal history of nicotine dependence: Secondary | ICD-10-CM | POA: Diagnosis not present

## 2015-10-08 DIAGNOSIS — G4730 Sleep apnea, unspecified: Secondary | ICD-10-CM | POA: Diagnosis not present

## 2015-10-08 DIAGNOSIS — S43432A Superior glenoid labrum lesion of left shoulder, initial encounter: Secondary | ICD-10-CM | POA: Diagnosis not present

## 2015-10-08 DIAGNOSIS — M19012 Primary osteoarthritis, left shoulder: Secondary | ICD-10-CM | POA: Diagnosis not present

## 2015-10-08 DIAGNOSIS — M75112 Incomplete rotator cuff tear or rupture of left shoulder, not specified as traumatic: Secondary | ICD-10-CM | POA: Diagnosis not present

## 2015-10-08 DIAGNOSIS — Z7984 Long term (current) use of oral hypoglycemic drugs: Secondary | ICD-10-CM | POA: Diagnosis not present

## 2015-10-08 DIAGNOSIS — M7542 Impingement syndrome of left shoulder: Secondary | ICD-10-CM | POA: Diagnosis not present

## 2015-10-08 DIAGNOSIS — I10 Essential (primary) hypertension: Secondary | ICD-10-CM | POA: Diagnosis not present

## 2015-10-08 DIAGNOSIS — M24112 Other articular cartilage disorders, left shoulder: Secondary | ICD-10-CM | POA: Diagnosis not present

## 2015-10-08 DIAGNOSIS — Z79899 Other long term (current) drug therapy: Secondary | ICD-10-CM | POA: Diagnosis not present

## 2015-10-08 DIAGNOSIS — Z6841 Body Mass Index (BMI) 40.0 and over, adult: Secondary | ICD-10-CM | POA: Diagnosis not present

## 2015-10-08 DIAGNOSIS — M66822 Spontaneous rupture of other tendons, left upper arm: Secondary | ICD-10-CM | POA: Diagnosis not present

## 2015-10-08 DIAGNOSIS — G8918 Other acute postprocedural pain: Secondary | ICD-10-CM | POA: Diagnosis not present

## 2015-10-08 DIAGNOSIS — E119 Type 2 diabetes mellitus without complications: Secondary | ICD-10-CM | POA: Diagnosis not present

## 2015-11-05 DIAGNOSIS — M25512 Pain in left shoulder: Secondary | ICD-10-CM | POA: Diagnosis not present

## 2015-11-05 DIAGNOSIS — M25511 Pain in right shoulder: Secondary | ICD-10-CM | POA: Diagnosis not present

## 2015-11-05 DIAGNOSIS — G4733 Obstructive sleep apnea (adult) (pediatric): Secondary | ICD-10-CM | POA: Diagnosis not present

## 2015-11-05 DIAGNOSIS — M25612 Stiffness of left shoulder, not elsewhere classified: Secondary | ICD-10-CM | POA: Diagnosis not present

## 2015-11-05 DIAGNOSIS — M25611 Stiffness of right shoulder, not elsewhere classified: Secondary | ICD-10-CM | POA: Diagnosis not present

## 2015-11-05 DIAGNOSIS — M6281 Muscle weakness (generalized): Secondary | ICD-10-CM | POA: Diagnosis not present

## 2015-11-05 DIAGNOSIS — E538 Deficiency of other specified B group vitamins: Secondary | ICD-10-CM | POA: Diagnosis not present

## 2015-11-07 DIAGNOSIS — M25612 Stiffness of left shoulder, not elsewhere classified: Secondary | ICD-10-CM | POA: Diagnosis not present

## 2015-11-07 DIAGNOSIS — M6281 Muscle weakness (generalized): Secondary | ICD-10-CM | POA: Diagnosis not present

## 2015-11-07 DIAGNOSIS — M25512 Pain in left shoulder: Secondary | ICD-10-CM | POA: Diagnosis not present

## 2015-11-12 DIAGNOSIS — M6281 Muscle weakness (generalized): Secondary | ICD-10-CM | POA: Diagnosis not present

## 2015-11-12 DIAGNOSIS — M25612 Stiffness of left shoulder, not elsewhere classified: Secondary | ICD-10-CM | POA: Diagnosis not present

## 2015-11-12 DIAGNOSIS — M25512 Pain in left shoulder: Secondary | ICD-10-CM | POA: Diagnosis not present

## 2015-11-13 DIAGNOSIS — G4733 Obstructive sleep apnea (adult) (pediatric): Secondary | ICD-10-CM | POA: Diagnosis not present

## 2015-11-15 DIAGNOSIS — M6281 Muscle weakness (generalized): Secondary | ICD-10-CM | POA: Diagnosis not present

## 2015-11-15 DIAGNOSIS — M25511 Pain in right shoulder: Secondary | ICD-10-CM | POA: Diagnosis not present

## 2015-11-15 DIAGNOSIS — M25611 Stiffness of right shoulder, not elsewhere classified: Secondary | ICD-10-CM | POA: Diagnosis not present

## 2015-11-19 DIAGNOSIS — M25611 Stiffness of right shoulder, not elsewhere classified: Secondary | ICD-10-CM | POA: Diagnosis not present

## 2015-11-19 DIAGNOSIS — M6281 Muscle weakness (generalized): Secondary | ICD-10-CM | POA: Diagnosis not present

## 2015-11-19 DIAGNOSIS — M25511 Pain in right shoulder: Secondary | ICD-10-CM | POA: Diagnosis not present

## 2015-11-21 DIAGNOSIS — M25511 Pain in right shoulder: Secondary | ICD-10-CM | POA: Diagnosis not present

## 2015-11-21 DIAGNOSIS — M25611 Stiffness of right shoulder, not elsewhere classified: Secondary | ICD-10-CM | POA: Diagnosis not present

## 2015-11-21 DIAGNOSIS — M6281 Muscle weakness (generalized): Secondary | ICD-10-CM | POA: Diagnosis not present

## 2015-11-26 DIAGNOSIS — M25612 Stiffness of left shoulder, not elsewhere classified: Secondary | ICD-10-CM | POA: Diagnosis not present

## 2015-11-26 DIAGNOSIS — M25512 Pain in left shoulder: Secondary | ICD-10-CM | POA: Diagnosis not present

## 2015-11-26 DIAGNOSIS — M6281 Muscle weakness (generalized): Secondary | ICD-10-CM | POA: Diagnosis not present

## 2015-11-28 DIAGNOSIS — M25512 Pain in left shoulder: Secondary | ICD-10-CM | POA: Diagnosis not present

## 2015-11-28 DIAGNOSIS — M6281 Muscle weakness (generalized): Secondary | ICD-10-CM | POA: Diagnosis not present

## 2015-12-03 DIAGNOSIS — M25512 Pain in left shoulder: Secondary | ICD-10-CM | POA: Diagnosis not present

## 2015-12-03 DIAGNOSIS — M6281 Muscle weakness (generalized): Secondary | ICD-10-CM | POA: Diagnosis not present

## 2015-12-05 DIAGNOSIS — M6281 Muscle weakness (generalized): Secondary | ICD-10-CM | POA: Diagnosis not present

## 2015-12-05 DIAGNOSIS — M25512 Pain in left shoulder: Secondary | ICD-10-CM | POA: Diagnosis not present

## 2015-12-10 DIAGNOSIS — M25512 Pain in left shoulder: Secondary | ICD-10-CM | POA: Diagnosis not present

## 2015-12-10 DIAGNOSIS — M6281 Muscle weakness (generalized): Secondary | ICD-10-CM | POA: Diagnosis not present

## 2015-12-12 DIAGNOSIS — M6281 Muscle weakness (generalized): Secondary | ICD-10-CM | POA: Diagnosis not present

## 2015-12-12 DIAGNOSIS — M25512 Pain in left shoulder: Secondary | ICD-10-CM | POA: Diagnosis not present

## 2015-12-17 DIAGNOSIS — M25512 Pain in left shoulder: Secondary | ICD-10-CM | POA: Diagnosis not present

## 2015-12-17 DIAGNOSIS — M6281 Muscle weakness (generalized): Secondary | ICD-10-CM | POA: Diagnosis not present

## 2015-12-19 DIAGNOSIS — M25512 Pain in left shoulder: Secondary | ICD-10-CM | POA: Diagnosis not present

## 2015-12-19 DIAGNOSIS — M6281 Muscle weakness (generalized): Secondary | ICD-10-CM | POA: Diagnosis not present

## 2015-12-25 DIAGNOSIS — R350 Frequency of micturition: Secondary | ICD-10-CM | POA: Diagnosis not present

## 2015-12-31 DIAGNOSIS — K7581 Nonalcoholic steatohepatitis (NASH): Secondary | ICD-10-CM | POA: Diagnosis not present

## 2015-12-31 DIAGNOSIS — M75101 Unspecified rotator cuff tear or rupture of right shoulder, not specified as traumatic: Secondary | ICD-10-CM | POA: Diagnosis not present

## 2016-01-06 DIAGNOSIS — H1013 Acute atopic conjunctivitis, bilateral: Secondary | ICD-10-CM | POA: Diagnosis not present

## 2016-01-06 DIAGNOSIS — J301 Allergic rhinitis due to pollen: Secondary | ICD-10-CM | POA: Diagnosis not present

## 2016-01-14 DIAGNOSIS — G5601 Carpal tunnel syndrome, right upper limb: Secondary | ICD-10-CM | POA: Diagnosis not present

## 2016-01-15 DIAGNOSIS — R42 Dizziness and giddiness: Secondary | ICD-10-CM | POA: Diagnosis not present

## 2016-01-15 DIAGNOSIS — J309 Allergic rhinitis, unspecified: Secondary | ICD-10-CM | POA: Diagnosis not present

## 2016-01-15 DIAGNOSIS — N390 Urinary tract infection, site not specified: Secondary | ICD-10-CM | POA: Diagnosis not present

## 2016-01-30 DIAGNOSIS — R200 Anesthesia of skin: Secondary | ICD-10-CM | POA: Diagnosis not present

## 2016-01-30 DIAGNOSIS — R202 Paresthesia of skin: Secondary | ICD-10-CM | POA: Diagnosis not present

## 2016-02-05 DIAGNOSIS — G4733 Obstructive sleep apnea (adult) (pediatric): Secondary | ICD-10-CM | POA: Diagnosis not present

## 2016-02-06 DIAGNOSIS — G5601 Carpal tunnel syndrome, right upper limb: Secondary | ICD-10-CM | POA: Diagnosis not present

## 2016-02-06 DIAGNOSIS — M25531 Pain in right wrist: Secondary | ICD-10-CM | POA: Diagnosis not present

## 2016-02-11 DIAGNOSIS — Z79899 Other long term (current) drug therapy: Secondary | ICD-10-CM | POA: Diagnosis not present

## 2016-02-11 DIAGNOSIS — Z7984 Long term (current) use of oral hypoglycemic drugs: Secondary | ICD-10-CM | POA: Diagnosis not present

## 2016-02-11 DIAGNOSIS — G5600 Carpal tunnel syndrome, unspecified upper limb: Secondary | ICD-10-CM | POA: Insufficient documentation

## 2016-02-11 DIAGNOSIS — G5601 Carpal tunnel syndrome, right upper limb: Secondary | ICD-10-CM | POA: Diagnosis not present

## 2016-02-11 DIAGNOSIS — Z6841 Body Mass Index (BMI) 40.0 and over, adult: Secondary | ICD-10-CM | POA: Diagnosis not present

## 2016-02-11 DIAGNOSIS — E119 Type 2 diabetes mellitus without complications: Secondary | ICD-10-CM | POA: Diagnosis not present

## 2016-03-23 DIAGNOSIS — R350 Frequency of micturition: Secondary | ICD-10-CM | POA: Diagnosis not present

## 2016-03-23 DIAGNOSIS — E119 Type 2 diabetes mellitus without complications: Secondary | ICD-10-CM | POA: Diagnosis not present

## 2016-03-23 DIAGNOSIS — Z7984 Long term (current) use of oral hypoglycemic drugs: Secondary | ICD-10-CM | POA: Diagnosis not present

## 2016-03-23 DIAGNOSIS — I10 Essential (primary) hypertension: Secondary | ICD-10-CM | POA: Diagnosis not present

## 2016-03-23 DIAGNOSIS — E1165 Type 2 diabetes mellitus with hyperglycemia: Secondary | ICD-10-CM | POA: Diagnosis not present

## 2016-03-23 DIAGNOSIS — N400 Enlarged prostate without lower urinary tract symptoms: Secondary | ICD-10-CM | POA: Diagnosis not present

## 2016-03-23 DIAGNOSIS — G4733 Obstructive sleep apnea (adult) (pediatric): Secondary | ICD-10-CM | POA: Diagnosis not present

## 2016-03-23 DIAGNOSIS — Z23 Encounter for immunization: Secondary | ICD-10-CM | POA: Diagnosis not present

## 2016-03-23 DIAGNOSIS — E7800 Pure hypercholesterolemia, unspecified: Secondary | ICD-10-CM | POA: Diagnosis not present

## 2016-03-31 DIAGNOSIS — M19011 Primary osteoarthritis, right shoulder: Secondary | ICD-10-CM | POA: Diagnosis not present

## 2016-03-31 DIAGNOSIS — S46011A Strain of muscle(s) and tendon(s) of the rotator cuff of right shoulder, initial encounter: Secondary | ICD-10-CM | POA: Diagnosis not present

## 2016-03-31 DIAGNOSIS — Z6841 Body Mass Index (BMI) 40.0 and over, adult: Secondary | ICD-10-CM | POA: Diagnosis not present

## 2016-03-31 DIAGNOSIS — E119 Type 2 diabetes mellitus without complications: Secondary | ICD-10-CM | POA: Diagnosis not present

## 2016-03-31 DIAGNOSIS — G4730 Sleep apnea, unspecified: Secondary | ICD-10-CM | POA: Diagnosis not present

## 2016-03-31 DIAGNOSIS — M7521 Bicipital tendinitis, right shoulder: Secondary | ICD-10-CM | POA: Diagnosis not present

## 2016-03-31 DIAGNOSIS — I10 Essential (primary) hypertension: Secondary | ICD-10-CM | POA: Diagnosis not present

## 2016-03-31 DIAGNOSIS — M75101 Unspecified rotator cuff tear or rupture of right shoulder, not specified as traumatic: Secondary | ICD-10-CM | POA: Diagnosis not present

## 2016-03-31 DIAGNOSIS — G8918 Other acute postprocedural pain: Secondary | ICD-10-CM | POA: Diagnosis not present

## 2016-03-31 DIAGNOSIS — S43431A Superior glenoid labrum lesion of right shoulder, initial encounter: Secondary | ICD-10-CM | POA: Diagnosis not present

## 2016-03-31 DIAGNOSIS — M7541 Impingement syndrome of right shoulder: Secondary | ICD-10-CM | POA: Diagnosis not present

## 2016-04-15 DIAGNOSIS — M25511 Pain in right shoulder: Secondary | ICD-10-CM | POA: Diagnosis not present

## 2016-04-15 DIAGNOSIS — M6281 Muscle weakness (generalized): Secondary | ICD-10-CM | POA: Diagnosis not present

## 2016-04-15 DIAGNOSIS — M25611 Stiffness of right shoulder, not elsewhere classified: Secondary | ICD-10-CM | POA: Diagnosis not present

## 2016-04-22 DIAGNOSIS — M6281 Muscle weakness (generalized): Secondary | ICD-10-CM | POA: Diagnosis not present

## 2016-04-22 DIAGNOSIS — M25511 Pain in right shoulder: Secondary | ICD-10-CM | POA: Diagnosis not present

## 2016-04-22 DIAGNOSIS — M25611 Stiffness of right shoulder, not elsewhere classified: Secondary | ICD-10-CM | POA: Diagnosis not present

## 2016-04-24 DIAGNOSIS — M25611 Stiffness of right shoulder, not elsewhere classified: Secondary | ICD-10-CM | POA: Diagnosis not present

## 2016-04-24 DIAGNOSIS — M6281 Muscle weakness (generalized): Secondary | ICD-10-CM | POA: Diagnosis not present

## 2016-04-24 DIAGNOSIS — M25511 Pain in right shoulder: Secondary | ICD-10-CM | POA: Diagnosis not present

## 2016-04-28 DIAGNOSIS — H2513 Age-related nuclear cataract, bilateral: Secondary | ICD-10-CM | POA: Diagnosis not present

## 2016-04-28 DIAGNOSIS — M6281 Muscle weakness (generalized): Secondary | ICD-10-CM | POA: Diagnosis not present

## 2016-04-28 DIAGNOSIS — H43813 Vitreous degeneration, bilateral: Secondary | ICD-10-CM | POA: Diagnosis not present

## 2016-04-28 DIAGNOSIS — H5203 Hypermetropia, bilateral: Secondary | ICD-10-CM | POA: Diagnosis not present

## 2016-04-28 DIAGNOSIS — H04123 Dry eye syndrome of bilateral lacrimal glands: Secondary | ICD-10-CM | POA: Diagnosis not present

## 2016-04-28 DIAGNOSIS — Z9889 Other specified postprocedural states: Secondary | ICD-10-CM | POA: Diagnosis not present

## 2016-04-28 DIAGNOSIS — E119 Type 2 diabetes mellitus without complications: Secondary | ICD-10-CM | POA: Diagnosis not present

## 2016-04-28 DIAGNOSIS — H524 Presbyopia: Secondary | ICD-10-CM | POA: Diagnosis not present

## 2016-04-28 DIAGNOSIS — M25511 Pain in right shoulder: Secondary | ICD-10-CM | POA: Diagnosis not present

## 2016-04-28 DIAGNOSIS — H35372 Puckering of macula, left eye: Secondary | ICD-10-CM | POA: Diagnosis not present

## 2016-04-28 DIAGNOSIS — H52203 Unspecified astigmatism, bilateral: Secondary | ICD-10-CM | POA: Diagnosis not present

## 2016-04-28 DIAGNOSIS — M25611 Stiffness of right shoulder, not elsewhere classified: Secondary | ICD-10-CM | POA: Diagnosis not present

## 2016-05-01 DIAGNOSIS — M6281 Muscle weakness (generalized): Secondary | ICD-10-CM | POA: Diagnosis not present

## 2016-05-01 DIAGNOSIS — M25611 Stiffness of right shoulder, not elsewhere classified: Secondary | ICD-10-CM | POA: Diagnosis not present

## 2016-05-01 DIAGNOSIS — M25511 Pain in right shoulder: Secondary | ICD-10-CM | POA: Diagnosis not present

## 2016-05-05 DIAGNOSIS — M25611 Stiffness of right shoulder, not elsewhere classified: Secondary | ICD-10-CM | POA: Diagnosis not present

## 2016-05-05 DIAGNOSIS — M6281 Muscle weakness (generalized): Secondary | ICD-10-CM | POA: Diagnosis not present

## 2016-05-05 DIAGNOSIS — M25511 Pain in right shoulder: Secondary | ICD-10-CM | POA: Diagnosis not present

## 2016-05-07 DIAGNOSIS — M6281 Muscle weakness (generalized): Secondary | ICD-10-CM | POA: Diagnosis not present

## 2016-05-07 DIAGNOSIS — M25611 Stiffness of right shoulder, not elsewhere classified: Secondary | ICD-10-CM | POA: Diagnosis not present

## 2016-05-07 DIAGNOSIS — M25511 Pain in right shoulder: Secondary | ICD-10-CM | POA: Diagnosis not present

## 2016-05-12 DIAGNOSIS — M6281 Muscle weakness (generalized): Secondary | ICD-10-CM | POA: Diagnosis not present

## 2016-05-12 DIAGNOSIS — M25611 Stiffness of right shoulder, not elsewhere classified: Secondary | ICD-10-CM | POA: Diagnosis not present

## 2016-05-12 DIAGNOSIS — M25511 Pain in right shoulder: Secondary | ICD-10-CM | POA: Diagnosis not present

## 2016-05-14 DIAGNOSIS — M25611 Stiffness of right shoulder, not elsewhere classified: Secondary | ICD-10-CM | POA: Diagnosis not present

## 2016-05-14 DIAGNOSIS — M25511 Pain in right shoulder: Secondary | ICD-10-CM | POA: Diagnosis not present

## 2016-05-14 DIAGNOSIS — M6281 Muscle weakness (generalized): Secondary | ICD-10-CM | POA: Diagnosis not present

## 2016-05-19 DIAGNOSIS — M25511 Pain in right shoulder: Secondary | ICD-10-CM | POA: Diagnosis not present

## 2016-05-19 DIAGNOSIS — M6281 Muscle weakness (generalized): Secondary | ICD-10-CM | POA: Diagnosis not present

## 2016-05-19 DIAGNOSIS — M25611 Stiffness of right shoulder, not elsewhere classified: Secondary | ICD-10-CM | POA: Diagnosis not present

## 2016-05-20 DIAGNOSIS — M461 Sacroiliitis, not elsewhere classified: Secondary | ICD-10-CM | POA: Diagnosis not present

## 2016-05-20 DIAGNOSIS — I10 Essential (primary) hypertension: Secondary | ICD-10-CM | POA: Diagnosis not present

## 2016-05-21 DIAGNOSIS — M25511 Pain in right shoulder: Secondary | ICD-10-CM | POA: Diagnosis not present

## 2016-05-21 DIAGNOSIS — M6281 Muscle weakness (generalized): Secondary | ICD-10-CM | POA: Diagnosis not present

## 2016-05-21 DIAGNOSIS — M25611 Stiffness of right shoulder, not elsewhere classified: Secondary | ICD-10-CM | POA: Diagnosis not present

## 2016-05-26 DIAGNOSIS — M25511 Pain in right shoulder: Secondary | ICD-10-CM | POA: Diagnosis not present

## 2016-05-26 DIAGNOSIS — M25611 Stiffness of right shoulder, not elsewhere classified: Secondary | ICD-10-CM | POA: Diagnosis not present

## 2016-05-26 DIAGNOSIS — M6281 Muscle weakness (generalized): Secondary | ICD-10-CM | POA: Diagnosis not present

## 2016-05-28 DIAGNOSIS — M25511 Pain in right shoulder: Secondary | ICD-10-CM | POA: Diagnosis not present

## 2016-05-28 DIAGNOSIS — M25611 Stiffness of right shoulder, not elsewhere classified: Secondary | ICD-10-CM | POA: Diagnosis not present

## 2016-05-28 DIAGNOSIS — M6281 Muscle weakness (generalized): Secondary | ICD-10-CM | POA: Diagnosis not present

## 2016-06-02 DIAGNOSIS — M6281 Muscle weakness (generalized): Secondary | ICD-10-CM | POA: Diagnosis not present

## 2016-06-02 DIAGNOSIS — M25511 Pain in right shoulder: Secondary | ICD-10-CM | POA: Diagnosis not present

## 2016-06-02 DIAGNOSIS — M25611 Stiffness of right shoulder, not elsewhere classified: Secondary | ICD-10-CM | POA: Diagnosis not present

## 2016-06-03 DIAGNOSIS — M461 Sacroiliitis, not elsewhere classified: Secondary | ICD-10-CM | POA: Diagnosis not present

## 2016-06-03 DIAGNOSIS — I10 Essential (primary) hypertension: Secondary | ICD-10-CM | POA: Diagnosis not present

## 2016-06-05 DIAGNOSIS — M6281 Muscle weakness (generalized): Secondary | ICD-10-CM | POA: Diagnosis not present

## 2016-06-05 DIAGNOSIS — M25511 Pain in right shoulder: Secondary | ICD-10-CM | POA: Diagnosis not present

## 2016-06-05 DIAGNOSIS — M25611 Stiffness of right shoulder, not elsewhere classified: Secondary | ICD-10-CM | POA: Diagnosis not present

## 2016-06-09 DIAGNOSIS — M6281 Muscle weakness (generalized): Secondary | ICD-10-CM | POA: Diagnosis not present

## 2016-06-09 DIAGNOSIS — M25511 Pain in right shoulder: Secondary | ICD-10-CM | POA: Diagnosis not present

## 2016-06-09 DIAGNOSIS — M25611 Stiffness of right shoulder, not elsewhere classified: Secondary | ICD-10-CM | POA: Diagnosis not present

## 2016-06-16 DIAGNOSIS — M6281 Muscle weakness (generalized): Secondary | ICD-10-CM | POA: Diagnosis not present

## 2016-06-16 DIAGNOSIS — M25611 Stiffness of right shoulder, not elsewhere classified: Secondary | ICD-10-CM | POA: Diagnosis not present

## 2016-06-16 DIAGNOSIS — M25511 Pain in right shoulder: Secondary | ICD-10-CM | POA: Diagnosis not present

## 2016-06-18 DIAGNOSIS — M6281 Muscle weakness (generalized): Secondary | ICD-10-CM | POA: Diagnosis not present

## 2016-06-18 DIAGNOSIS — M25611 Stiffness of right shoulder, not elsewhere classified: Secondary | ICD-10-CM | POA: Diagnosis not present

## 2016-06-18 DIAGNOSIS — M25511 Pain in right shoulder: Secondary | ICD-10-CM | POA: Diagnosis not present

## 2016-06-23 DIAGNOSIS — M25511 Pain in right shoulder: Secondary | ICD-10-CM | POA: Diagnosis not present

## 2016-06-23 DIAGNOSIS — M25611 Stiffness of right shoulder, not elsewhere classified: Secondary | ICD-10-CM | POA: Diagnosis not present

## 2016-06-23 DIAGNOSIS — M6281 Muscle weakness (generalized): Secondary | ICD-10-CM | POA: Diagnosis not present

## 2016-07-08 DIAGNOSIS — Z6841 Body Mass Index (BMI) 40.0 and over, adult: Secondary | ICD-10-CM | POA: Diagnosis not present

## 2016-07-08 DIAGNOSIS — M47816 Spondylosis without myelopathy or radiculopathy, lumbar region: Secondary | ICD-10-CM | POA: Diagnosis not present

## 2016-08-18 DIAGNOSIS — H35372 Puckering of macula, left eye: Secondary | ICD-10-CM | POA: Diagnosis not present

## 2016-08-19 DIAGNOSIS — M47816 Spondylosis without myelopathy or radiculopathy, lumbar region: Secondary | ICD-10-CM | POA: Diagnosis not present

## 2016-08-19 DIAGNOSIS — I10 Essential (primary) hypertension: Secondary | ICD-10-CM | POA: Diagnosis not present

## 2016-08-27 DIAGNOSIS — G5601 Carpal tunnel syndrome, right upper limb: Secondary | ICD-10-CM | POA: Diagnosis not present

## 2016-08-31 DIAGNOSIS — Z6841 Body Mass Index (BMI) 40.0 and over, adult: Secondary | ICD-10-CM | POA: Diagnosis not present

## 2016-08-31 DIAGNOSIS — M461 Sacroiliitis, not elsewhere classified: Secondary | ICD-10-CM | POA: Diagnosis not present

## 2016-09-01 DIAGNOSIS — Z6841 Body Mass Index (BMI) 40.0 and over, adult: Secondary | ICD-10-CM | POA: Diagnosis not present

## 2016-09-01 DIAGNOSIS — Z1211 Encounter for screening for malignant neoplasm of colon: Secondary | ICD-10-CM | POA: Diagnosis not present

## 2016-09-01 DIAGNOSIS — N400 Enlarged prostate without lower urinary tract symptoms: Secondary | ICD-10-CM | POA: Diagnosis not present

## 2016-09-01 DIAGNOSIS — I10 Essential (primary) hypertension: Secondary | ICD-10-CM | POA: Diagnosis not present

## 2016-09-01 DIAGNOSIS — E7800 Pure hypercholesterolemia, unspecified: Secondary | ICD-10-CM | POA: Diagnosis not present

## 2016-09-01 DIAGNOSIS — E538 Deficiency of other specified B group vitamins: Secondary | ICD-10-CM | POA: Diagnosis not present

## 2016-09-01 DIAGNOSIS — K760 Fatty (change of) liver, not elsewhere classified: Secondary | ICD-10-CM | POA: Diagnosis not present

## 2016-09-01 DIAGNOSIS — Z0000 Encounter for general adult medical examination without abnormal findings: Secondary | ICD-10-CM | POA: Diagnosis not present

## 2016-09-01 DIAGNOSIS — Z7984 Long term (current) use of oral hypoglycemic drugs: Secondary | ICD-10-CM | POA: Diagnosis not present

## 2016-09-01 DIAGNOSIS — Z1389 Encounter for screening for other disorder: Secondary | ICD-10-CM | POA: Diagnosis not present

## 2016-09-01 DIAGNOSIS — D696 Thrombocytopenia, unspecified: Secondary | ICD-10-CM | POA: Diagnosis not present

## 2016-09-01 DIAGNOSIS — K7581 Nonalcoholic steatohepatitis (NASH): Secondary | ICD-10-CM | POA: Diagnosis not present

## 2016-09-01 DIAGNOSIS — M461 Sacroiliitis, not elsewhere classified: Secondary | ICD-10-CM | POA: Diagnosis not present

## 2016-09-01 DIAGNOSIS — E119 Type 2 diabetes mellitus without complications: Secondary | ICD-10-CM | POA: Diagnosis not present

## 2016-09-01 DIAGNOSIS — K219 Gastro-esophageal reflux disease without esophagitis: Secondary | ICD-10-CM | POA: Diagnosis not present

## 2016-09-23 DIAGNOSIS — R200 Anesthesia of skin: Secondary | ICD-10-CM | POA: Diagnosis not present

## 2016-09-23 DIAGNOSIS — R202 Paresthesia of skin: Secondary | ICD-10-CM | POA: Diagnosis not present

## 2016-09-29 DIAGNOSIS — G5601 Carpal tunnel syndrome, right upper limb: Secondary | ICD-10-CM | POA: Diagnosis not present

## 2016-10-06 ENCOUNTER — Encounter: Payer: Medicare Other | Attending: Internal Medicine | Admitting: Dietician

## 2016-10-06 ENCOUNTER — Encounter: Payer: Self-pay | Admitting: Dietician

## 2016-10-06 DIAGNOSIS — E119 Type 2 diabetes mellitus without complications: Secondary | ICD-10-CM | POA: Diagnosis not present

## 2016-10-06 DIAGNOSIS — Z713 Dietary counseling and surveillance: Secondary | ICD-10-CM | POA: Insufficient documentation

## 2016-10-06 DIAGNOSIS — E118 Type 2 diabetes mellitus with unspecified complications: Secondary | ICD-10-CM

## 2016-10-06 DIAGNOSIS — Z6841 Body Mass Index (BMI) 40.0 and over, adult: Secondary | ICD-10-CM | POA: Diagnosis not present

## 2016-10-06 NOTE — Progress Notes (Signed)
Diabetes Self-Management Education  Visit Type: Follow-up  Appt. Start Time: 1540 Appt. End Time: 1700  10/06/2016  [name], identified by name and date of birth, is a 71 y.o. male with a diagnosis of Diabetes: Type 2. Other hx includes OSA-uses C-pap, vitamin B-12 deficiency (takes a supplement), NASH, HTN, and GERD.  He would like to lose weight.  He states that he had lost 20 lbs recently by decreasing the amount of sweets. He would like to learn more about what to eat (meal planning) and appropriate snacks.  What do I eat if I am hungry?  Medicastions include Glipizide and Saxagliptan-Metformin  Patient lives alone, cooks and shops for himself.  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  ASSESSMENT  Height 6' (1.829 m), weight (!) 319 lb (144.7 kg). Body mass index is 43.26 kg/m.      Diabetes Self-Management Education - 10/06/16 1553      Visit Information   Visit Type Follow-up     Initial Visit   Diabetes Type Type 2   Are you currently following a meal plan? No   Are you taking your medications as prescribed? Yes   Date Diagnosed 10-12 years ago     Health Coping   How would you rate your overall health? Good     Psychosocial Assessment   Patient Belief/Attitude about Diabetes Motivated to manage diabetes   Self-care barriers None   Self-management support Doctor's office   Other persons present Patient   Patient Concerns Glycemic Control;Nutrition/Meal planning;Weight Control   Special Needs None   Preferred Learning Style No preference indicated   Learning Readiness Ready   How often do you need to have someone help you when you read instructions, pamphlets, or other written materials from your doctor or pharmacy? 1 - Never   What is the last grade level you completed in school? 12th grade     Pre-Education Assessment   Patient understands the diabetes disease and treatment process.  Needs Review   Patient understands incorporating nutritional management into lifestyle. Needs Review   Patient undertands incorporating physical activity into lifestyle. Needs Review   Patient understands using medications safely. Needs Review   Patient understands monitoring blood glucose, interpreting and using results Needs Review   Patient understands prevention, detection, and treatment of acute complications. Needs Review   Patient understands prevention, detection, and treatment of chronic complications. Needs Review   Patient understands how to develop strategies to address psychosocial issues. Needs Review   Patient understands how to develop strategies to promote health/change behavior. Needs Review     Complications   Last HgB A1C per patient/outside source 8.1 %  08/2016   How often do you check your blood sugar? 0 times/day (not testing)  meter is not working   Have you had a dilated eye exam in the past 12 months? Yes   Have you had a dental exam in the past 12 months? Yes   Are you checking your feet? Yes   How many days per week are you checking your feet? 7     Dietary Intake   Breakfast 2 eggs OR 2 small sausage, 2 eggs   Lunch 6" sub at subway   Snack (afternoon) peanut butter and crackers or bread Gregory Estrada)   Dinner fried chicken or pork chop (1-2 tsp butter), pork and beans (but sometimes skips), 2% milk    Beverage(s) 2%  milk, lipton sugar free flavor packet, water, coffee (black)     Exercise   Exercise Type Light (walking / raking leaves);ADL's  shoots sporting clays occasionally, has a stationary bike but does not use it.     Patient Education   Previous Diabetes Education Yes (please comment)   Disease state  Other (comment)  reviewed insulin resistance   Nutrition management  Food label reading, portion sizes and measuring food.;Meal options for control of blood glucose level and chronic complications.;Role of diet in the treatment of diabetes and the  relationship between the three main macronutrients and blood glucose level   Physical activity and exercise  Role of exercise on diabetes management, blood pressure control and cardiac health.;Helped patient identify appropriate exercises in relation to his/her diabetes, diabetes complications and other health issue.   Monitoring Identified appropriate SMBG and/or A1C goals.;Purpose and frequency of SMBG.   Psychosocial adjustment Worked with patient to identify barriers to care and solutions;Identified and addressed patients feelings and concerns about diabetes     Individualized Goals (developed by patient)   Nutrition General guidelines for healthy choices and portions discussed   Physical Activity Exercise 3-5 times per week;15 minutes per day   Medications take my medication as prescribed   Monitoring  test my blood glucose as discussed   Problem Solving meal choices   Reducing Risk examine blood glucose patterns     Patient Self-Evaluation of Goals - Patient rates self as meeting previously set goals (% of time)   Nutrition 50 - 75 %   Physical Activity < 25%   Medications >75%   Monitoring < 25%   Problem Solving 25 - 50%   Reducing Risk 25 - 50%   Health Coping >75%     Post-Education Assessment   Patient understands the diabetes disease and treatment process. Demonstrates understanding / competency   Patient understands incorporating nutritional management into lifestyle. Needs Review   Patient undertands incorporating physical activity into lifestyle. Needs Review   Patient understands using medications safely. Demonstrates understanding / competency   Patient understands monitoring blood glucose, interpreting and using results Needs Review   Patient understands prevention, detection, and treatment of chronic complications. Demonstrates understanding / competency   Patient understands how to develop strategies to address psychosocial issues. Demonstrates understanding /  competency   Patient understands how to develop strategies to promote health/change behavior. Demonstrates understanding / competency     Outcomes   Expected Outcomes Demonstrated interest in learning. Expect positive outcomes   Future DMSE Yearly   Program Status Completed     Subsequent Visit   Since your last visit have you experienced any weight changes? Gain   Weight Gain (lbs) 4   Since your last visit, are you checking your blood glucose at least once a day? No      Individualized Plan for Diabetes Self-Management Training:   Learning Objective:  Patient will have a greater understanding of diabetes self-management. Patient education plan is to attend individual and/or group sessions per assessed needs and concerns. Patient eats very few vegetables.  Meals are simple and often from a can with some fresh meat.  He skips carbohydrates with meals only to crave later.  Discussed consistent carbohydrates and mindfulness with all meals.  Avoid eating after dinner due to GERD.  Plan:   Patient Instructions  Breakfast ideas:  1.  Eggs, 1 slice [employer] toast, fruit, 1% milk  2.  [employer] English muffin, egg, [place] sausage, fruit  3.  Greek yogurt, 1/2 cup fruit, 2 tablespoons granola or nuts  4.  2 Nutrigrain waffles, 1 tsp butter, sugar free syrup, 1 egg, fruit or 1% milk  5.  2 Nutrigrain waffles, peanut butter, applesauce, sugar free syrup, egg or [place] sausage  6.  Eggs, 1/2 cup grits, 1 slice [employer] toast, 1% milk  7.  Vegetable omelet (Sautee onions, peppers, tomatoes, fresh spinach), fruit  8.  Protein vege mix (such as Birds-eye found in freezer)- saute this and when done crack 1-2 eggs over the top, break the yolk and cover with a lid until done.  Good job on changing your beverages. Consider checking your blood sugar again. Aim to be more active most days. Start with 10 minutes and increase slowly as tolerated. Carbohydrate with small amount of protein with each meal. Continue to eat  slowly.  Stop when you are satisfied. Increase your intake of non starchy vegetables.  Be mindful about what carbohydrates you are eating and the portion size.    Expected Outcomes:  Demonstrated interest in learning. Expect positive outcomes  Education material provided: Living Well with Diabetes, Food label handouts, A1C conversion sheet, Meal plan card, My Plate and Snack sheet, breakfast ideas.  If problems or questions, patient to contact team via:  Phone  Future DSME appointment: Yearly

## 2016-10-06 NOTE — Patient Instructions (Signed)
Breakfast ideas:  1.  Eggs, 1 slice [employer] toast, fruit, 1% milk  2.  [employer] English muffin, egg, [place] sausage, fruit  3.  Greek yogurt, 1/2 cup fruit, 2 tablespoons granola or nuts  4.  2 Nutrigrain waffles, 1 tsp butter, sugar free syrup, 1 egg, fruit or 1% milk  5.  2 Nutrigrain waffles, peanut butter, applesauce, sugar free syrup, egg or [place] sausage  6.  Eggs, 1/2 cup grits, 1 slice [employer] toast, 1% milk  7.  Vegetable omelet (Sautee onions, peppers, tomatoes, fresh spinach), fruit  8.  Protein vege mix (such as Birds-eye found in freezer)- saute this and when done crack 1-2 eggs over the top, break the yolk and cover with a lid until done.  Good job on changing your beverages. Consider checking your blood sugar again. Aim to be more active most days. Start with 10 minutes and increase slowly as tolerated. Carbohydrate with small amount of protein with each meal. Continue to eat slowly.  Stop when you are satisfied. Increase your intake of non starchy vegetables.  Be mindful about what carbohydrates you are eating and the portion size.

## 2016-10-20 DIAGNOSIS — Z6841 Body Mass Index (BMI) 40.0 and over, adult: Secondary | ICD-10-CM | POA: Diagnosis not present

## 2016-10-20 DIAGNOSIS — I10 Essential (primary) hypertension: Secondary | ICD-10-CM | POA: Diagnosis not present

## 2016-10-20 DIAGNOSIS — E119 Type 2 diabetes mellitus without complications: Secondary | ICD-10-CM | POA: Diagnosis not present

## 2016-10-20 DIAGNOSIS — Z7984 Long term (current) use of oral hypoglycemic drugs: Secondary | ICD-10-CM | POA: Diagnosis not present

## 2016-10-20 DIAGNOSIS — Z79899 Other long term (current) drug therapy: Secondary | ICD-10-CM | POA: Diagnosis not present

## 2016-10-20 DIAGNOSIS — G5601 Carpal tunnel syndrome, right upper limb: Secondary | ICD-10-CM | POA: Diagnosis not present

## 2016-10-20 DIAGNOSIS — G4733 Obstructive sleep apnea (adult) (pediatric): Secondary | ICD-10-CM | POA: Diagnosis not present

## 2016-10-27 DIAGNOSIS — H04123 Dry eye syndrome of bilateral lacrimal glands: Secondary | ICD-10-CM | POA: Diagnosis not present

## 2016-11-05 DIAGNOSIS — M545 Low back pain: Secondary | ICD-10-CM | POA: Diagnosis not present

## 2016-11-05 DIAGNOSIS — R399 Unspecified symptoms and signs involving the genitourinary system: Secondary | ICD-10-CM | POA: Insufficient documentation

## 2016-11-05 DIAGNOSIS — M461 Sacroiliitis, not elsewhere classified: Secondary | ICD-10-CM | POA: Diagnosis not present

## 2016-11-11 DIAGNOSIS — M461 Sacroiliitis, not elsewhere classified: Secondary | ICD-10-CM | POA: Diagnosis not present

## 2016-11-11 DIAGNOSIS — M546 Pain in thoracic spine: Secondary | ICD-10-CM | POA: Diagnosis not present

## 2016-11-11 DIAGNOSIS — M545 Low back pain: Secondary | ICD-10-CM | POA: Diagnosis not present

## 2016-11-15 IMAGING — CR DG RIBS W/ CHEST 3+V*L*
4 series · 4 of 4 positions shown · non-contrast
Comparison: 07/07/2012.

CLINICAL DATA: Rib pain.  Initial encounter.

EXAM:
LEFT RIBS AND CHEST - 3+ VIEW

[w chest pa]
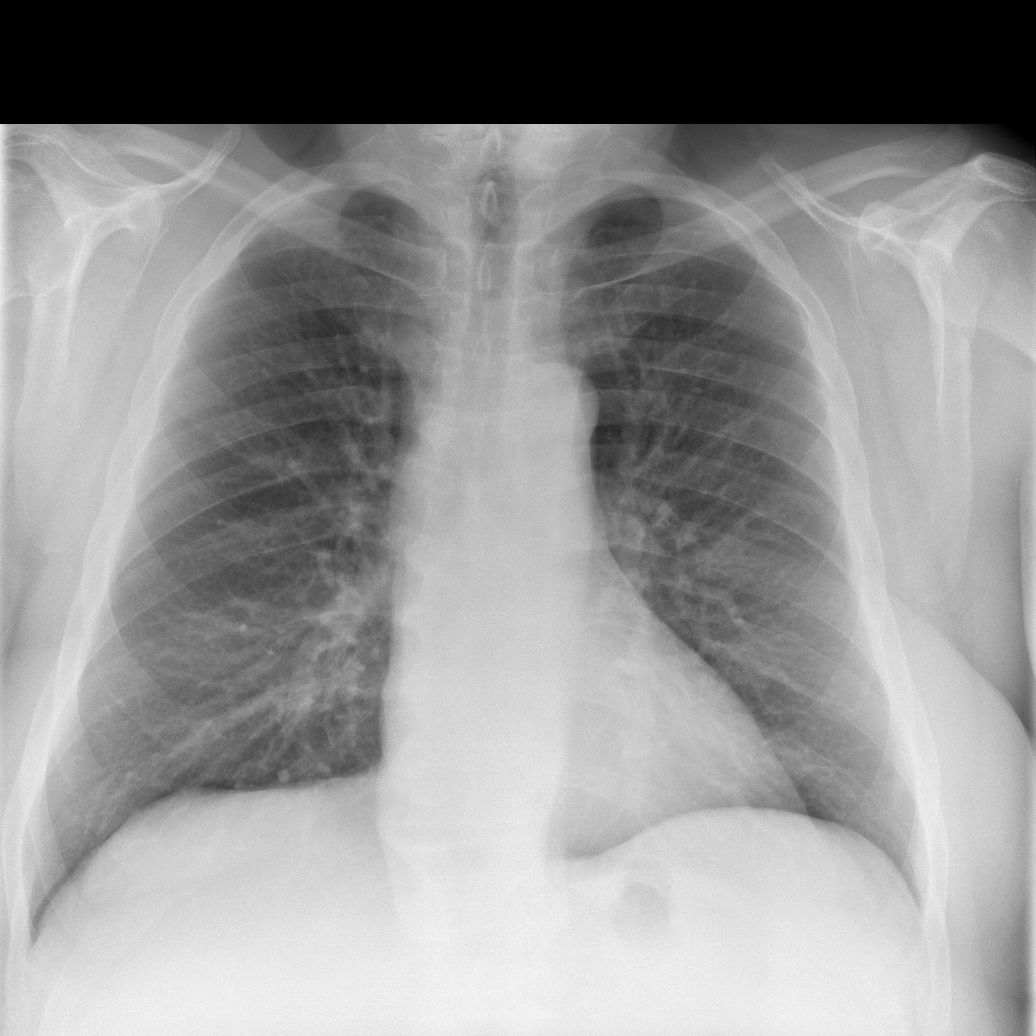

[w ribs ap/pa upper left *]
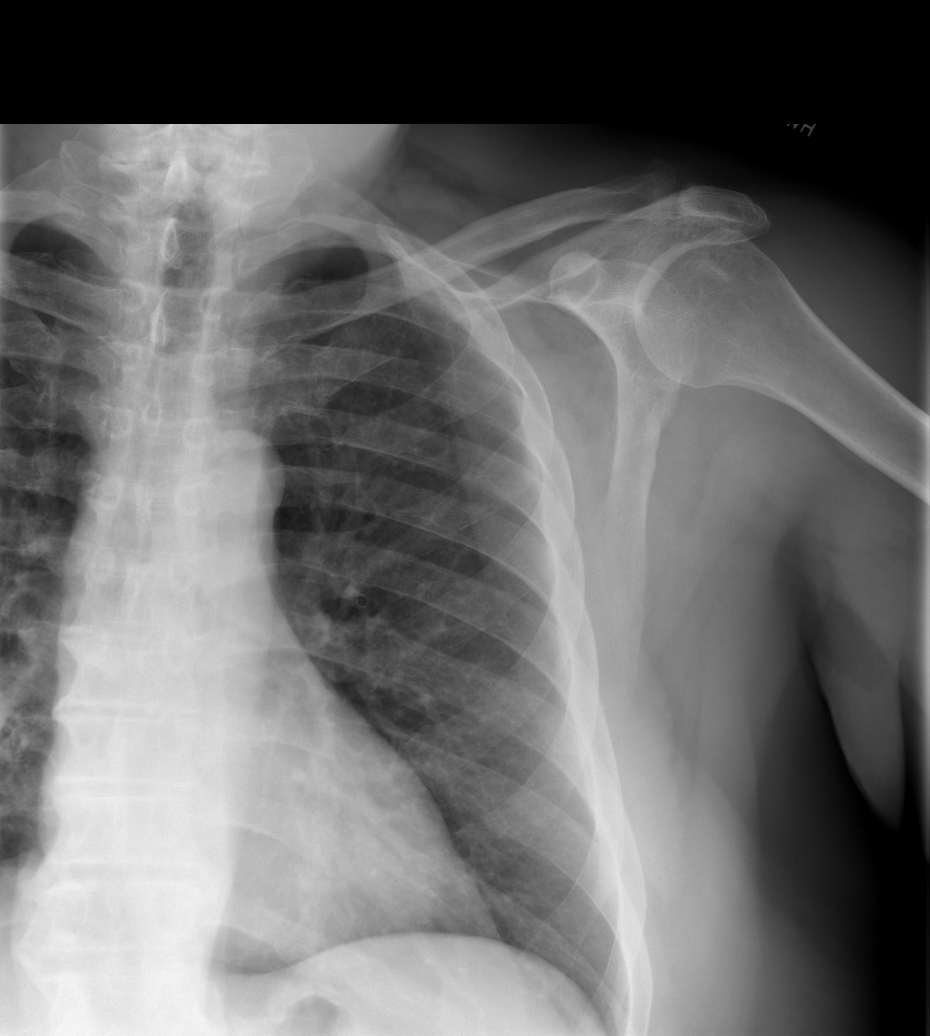

[w ribs ap/pa lower left *]
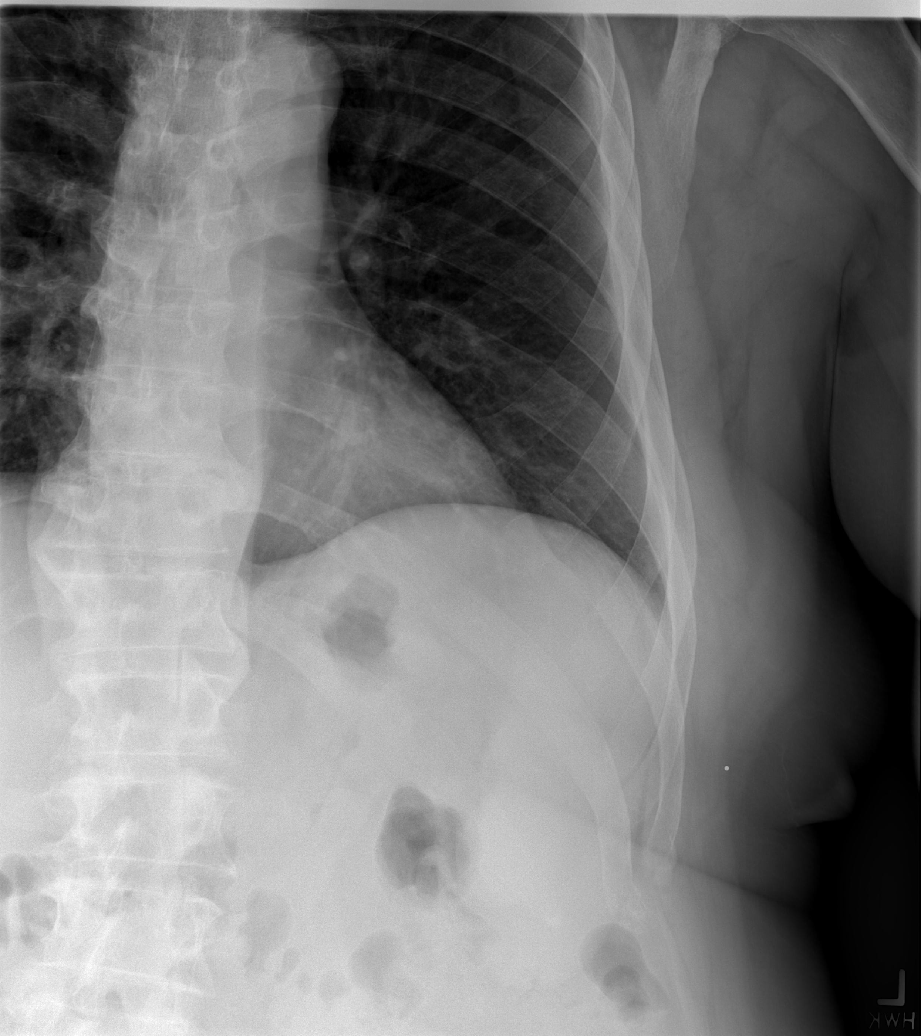

[w ribs oblique left *]
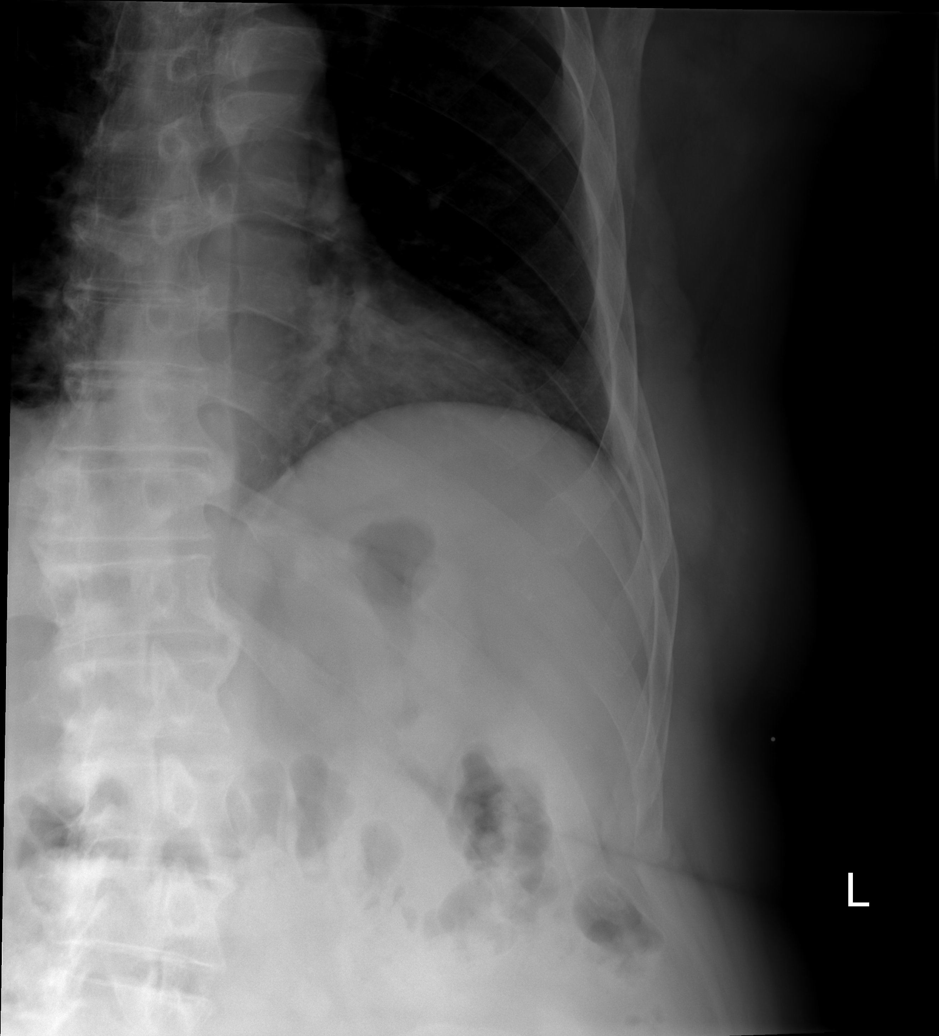

[4 of 4 positions shown; findings below may reference images not displayed]

FINDINGS: No fracture or other bone lesions are seen involving the ribs. There
is no evidence of pneumothorax or pleural effusion. Both lungs are
clear. Heart size and mediastinal contours are within normal limits.
IMPRESSION: Negative.

## 2016-11-16 DIAGNOSIS — M545 Low back pain: Secondary | ICD-10-CM | POA: Diagnosis not present

## 2016-11-16 DIAGNOSIS — M546 Pain in thoracic spine: Secondary | ICD-10-CM | POA: Diagnosis not present

## 2016-11-16 DIAGNOSIS — M461 Sacroiliitis, not elsewhere classified: Secondary | ICD-10-CM | POA: Diagnosis not present

## 2016-11-18 DIAGNOSIS — Z6841 Body Mass Index (BMI) 40.0 and over, adult: Secondary | ICD-10-CM | POA: Diagnosis not present

## 2016-11-18 DIAGNOSIS — M533 Sacrococcygeal disorders, not elsewhere classified: Secondary | ICD-10-CM | POA: Insufficient documentation

## 2016-11-18 DIAGNOSIS — M47818 Spondylosis without myelopathy or radiculopathy, sacral and sacrococcygeal region: Secondary | ICD-10-CM | POA: Diagnosis not present

## 2016-11-18 DIAGNOSIS — I10 Essential (primary) hypertension: Secondary | ICD-10-CM | POA: Diagnosis not present

## 2016-11-30 DIAGNOSIS — M461 Sacroiliitis, not elsewhere classified: Secondary | ICD-10-CM | POA: Diagnosis not present

## 2016-11-30 DIAGNOSIS — M545 Low back pain: Secondary | ICD-10-CM | POA: Diagnosis not present

## 2016-11-30 DIAGNOSIS — M546 Pain in thoracic spine: Secondary | ICD-10-CM | POA: Diagnosis not present

## 2016-12-01 ENCOUNTER — Other Ambulatory Visit: Payer: Self-pay | Admitting: Gastroenterology

## 2016-12-01 DIAGNOSIS — Z86010 Personal history of colonic polyps: Secondary | ICD-10-CM | POA: Diagnosis not present

## 2016-12-01 DIAGNOSIS — K760 Fatty (change of) liver, not elsewhere classified: Secondary | ICD-10-CM | POA: Diagnosis not present

## 2016-12-09 DIAGNOSIS — M1A9XX1 Chronic gout, unspecified, with tophus (tophi): Secondary | ICD-10-CM | POA: Diagnosis not present

## 2016-12-09 DIAGNOSIS — Z7984 Long term (current) use of oral hypoglycemic drugs: Secondary | ICD-10-CM | POA: Diagnosis not present

## 2016-12-09 DIAGNOSIS — E119 Type 2 diabetes mellitus without complications: Secondary | ICD-10-CM | POA: Diagnosis not present

## 2016-12-15 DIAGNOSIS — M48061 Spinal stenosis, lumbar region without neurogenic claudication: Secondary | ICD-10-CM | POA: Diagnosis not present

## 2016-12-15 DIAGNOSIS — M461 Sacroiliitis, not elsewhere classified: Secondary | ICD-10-CM | POA: Diagnosis not present

## 2016-12-15 DIAGNOSIS — M47816 Spondylosis without myelopathy or radiculopathy, lumbar region: Secondary | ICD-10-CM | POA: Diagnosis not present

## 2016-12-15 DIAGNOSIS — M47818 Spondylosis without myelopathy or radiculopathy, sacral and sacrococcygeal region: Secondary | ICD-10-CM | POA: Diagnosis not present

## 2016-12-22 ENCOUNTER — Ambulatory Visit
Admission: RE | Admit: 2016-12-22 | Discharge: 2016-12-22 | Disposition: A | Discharge status: Home / Self Care | Payer: Medicare Other | Source: Ambulatory Visit | Attending: Gastroenterology | Admitting: Gastroenterology

## 2016-12-22 DIAGNOSIS — K760 Fatty (change of) liver, not elsewhere classified: Secondary | ICD-10-CM | POA: Diagnosis not present

## 2016-12-23 ENCOUNTER — Other Ambulatory Visit: Payer: Self-pay | Admitting: Gastroenterology

## 2016-12-23 DIAGNOSIS — R935 Abnormal findings on diagnostic imaging of other abdominal regions, including retroperitoneum: Secondary | ICD-10-CM

## 2016-12-28 DIAGNOSIS — R350 Frequency of micturition: Secondary | ICD-10-CM | POA: Diagnosis not present

## 2016-12-28 DIAGNOSIS — N3000 Acute cystitis without hematuria: Secondary | ICD-10-CM | POA: Diagnosis not present

## 2016-12-30 DIAGNOSIS — R0981 Nasal congestion: Secondary | ICD-10-CM | POA: Diagnosis not present

## 2016-12-30 DIAGNOSIS — G8929 Other chronic pain: Secondary | ICD-10-CM | POA: Diagnosis not present

## 2016-12-30 DIAGNOSIS — M546 Pain in thoracic spine: Secondary | ICD-10-CM | POA: Diagnosis not present

## 2017-01-05 ENCOUNTER — Ambulatory Visit
Admission: RE | Admit: 2017-01-05 | Discharge: 2017-01-05 | Disposition: A | Discharge status: Home / Self Care | Payer: Medicare Other | Source: Ambulatory Visit | Attending: Gastroenterology | Admitting: Gastroenterology

## 2017-01-05 DIAGNOSIS — R935 Abnormal findings on diagnostic imaging of other abdominal regions, including retroperitoneum: Secondary | ICD-10-CM

## 2017-01-05 DIAGNOSIS — K760 Fatty (change of) liver, not elsewhere classified: Secondary | ICD-10-CM | POA: Diagnosis not present

## 2017-01-05 MED ORDER — IOPAMIDOL (ISOVUE-300) INJECTION 61%
125.0000 mL | Freq: Once | INTRAVENOUS | Status: AC | PRN
  Administered 2017-01-05: 125 mL via INTRAVENOUS

## 2017-01-13 DIAGNOSIS — Z6841 Body Mass Index (BMI) 40.0 and over, adult: Secondary | ICD-10-CM | POA: Diagnosis not present

## 2017-01-13 DIAGNOSIS — M47816 Spondylosis without myelopathy or radiculopathy, lumbar region: Secondary | ICD-10-CM | POA: Diagnosis not present

## 2017-01-26 DIAGNOSIS — Z23 Encounter for immunization: Secondary | ICD-10-CM | POA: Diagnosis not present

## 2017-01-26 DIAGNOSIS — R350 Frequency of micturition: Secondary | ICD-10-CM | POA: Diagnosis not present

## 2017-01-26 DIAGNOSIS — N400 Enlarged prostate without lower urinary tract symptoms: Secondary | ICD-10-CM | POA: Diagnosis not present

## 2017-02-02 DIAGNOSIS — Z86010 Personal history of colonic polyps: Secondary | ICD-10-CM | POA: Diagnosis not present

## 2017-02-02 DIAGNOSIS — K317 Polyp of stomach and duodenum: Secondary | ICD-10-CM | POA: Diagnosis not present

## 2017-02-02 DIAGNOSIS — D490 Neoplasm of unspecified behavior of digestive system: Secondary | ICD-10-CM | POA: Diagnosis not present

## 2017-02-02 DIAGNOSIS — K219 Gastro-esophageal reflux disease without esophagitis: Secondary | ICD-10-CM | POA: Diagnosis not present

## 2017-02-03 DIAGNOSIS — G4733 Obstructive sleep apnea (adult) (pediatric): Secondary | ICD-10-CM | POA: Diagnosis not present

## 2017-02-08 DIAGNOSIS — D490 Neoplasm of unspecified behavior of digestive system: Secondary | ICD-10-CM | POA: Diagnosis not present

## 2017-02-08 DIAGNOSIS — K317 Polyp of stomach and duodenum: Secondary | ICD-10-CM | POA: Diagnosis not present

## 2017-02-23 DIAGNOSIS — K6389 Other specified diseases of intestine: Secondary | ICD-10-CM | POA: Diagnosis not present

## 2017-03-02 DIAGNOSIS — D696 Thrombocytopenia, unspecified: Secondary | ICD-10-CM | POA: Diagnosis not present

## 2017-03-02 DIAGNOSIS — E7800 Pure hypercholesterolemia, unspecified: Secondary | ICD-10-CM | POA: Diagnosis not present

## 2017-03-02 DIAGNOSIS — I700 Atherosclerosis of aorta: Secondary | ICD-10-CM | POA: Diagnosis not present

## 2017-03-02 DIAGNOSIS — G4733 Obstructive sleep apnea (adult) (pediatric): Secondary | ICD-10-CM | POA: Diagnosis not present

## 2017-03-02 DIAGNOSIS — E119 Type 2 diabetes mellitus without complications: Secondary | ICD-10-CM | POA: Diagnosis not present

## 2017-03-02 DIAGNOSIS — I10 Essential (primary) hypertension: Secondary | ICD-10-CM | POA: Diagnosis not present

## 2017-05-11 DIAGNOSIS — H524 Presbyopia: Secondary | ICD-10-CM | POA: Diagnosis not present

## 2017-05-11 DIAGNOSIS — E119 Type 2 diabetes mellitus without complications: Secondary | ICD-10-CM | POA: Diagnosis not present

## 2017-05-11 DIAGNOSIS — H2513 Age-related nuclear cataract, bilateral: Secondary | ICD-10-CM | POA: Diagnosis not present

## 2017-05-11 DIAGNOSIS — Z7984 Long term (current) use of oral hypoglycemic drugs: Secondary | ICD-10-CM | POA: Diagnosis not present

## 2017-05-11 DIAGNOSIS — Z9889 Other specified postprocedural states: Secondary | ICD-10-CM | POA: Diagnosis not present

## 2017-05-22 DIAGNOSIS — J209 Acute bronchitis, unspecified: Secondary | ICD-10-CM | POA: Diagnosis not present

## 2017-06-04 DIAGNOSIS — M25469 Effusion, unspecified knee: Secondary | ICD-10-CM | POA: Diagnosis not present

## 2017-06-04 DIAGNOSIS — J209 Acute bronchitis, unspecified: Secondary | ICD-10-CM | POA: Diagnosis not present

## 2017-07-01 DIAGNOSIS — J0100 Acute maxillary sinusitis, unspecified: Secondary | ICD-10-CM | POA: Diagnosis not present

## 2017-07-12 DIAGNOSIS — S61209A Unspecified open wound of unspecified finger without damage to nail, initial encounter: Secondary | ICD-10-CM | POA: Diagnosis not present

## 2017-07-12 DIAGNOSIS — M79644 Pain in right finger(s): Secondary | ICD-10-CM | POA: Diagnosis not present

## 2017-07-19 DIAGNOSIS — S61209D Unspecified open wound of unspecified finger without damage to nail, subsequent encounter: Secondary | ICD-10-CM | POA: Diagnosis not present

## 2017-07-19 DIAGNOSIS — M79644 Pain in right finger(s): Secondary | ICD-10-CM | POA: Diagnosis not present

## 2017-07-19 DIAGNOSIS — L03019 Cellulitis of unspecified finger: Secondary | ICD-10-CM | POA: Diagnosis not present

## 2017-07-21 DIAGNOSIS — M170 Bilateral primary osteoarthritis of knee: Secondary | ICD-10-CM | POA: Diagnosis not present

## 2017-07-21 DIAGNOSIS — Z471 Aftercare following joint replacement surgery: Secondary | ICD-10-CM | POA: Diagnosis not present

## 2017-07-21 DIAGNOSIS — Z96653 Presence of artificial knee joint, bilateral: Secondary | ICD-10-CM | POA: Diagnosis not present

## 2017-09-09 DIAGNOSIS — Z1389 Encounter for screening for other disorder: Secondary | ICD-10-CM | POA: Diagnosis not present

## 2017-09-09 DIAGNOSIS — I10 Essential (primary) hypertension: Secondary | ICD-10-CM | POA: Diagnosis not present

## 2017-09-09 DIAGNOSIS — E1169 Type 2 diabetes mellitus with other specified complication: Secondary | ICD-10-CM | POA: Diagnosis not present

## 2017-09-09 DIAGNOSIS — G4730 Sleep apnea, unspecified: Secondary | ICD-10-CM | POA: Diagnosis not present

## 2017-09-09 DIAGNOSIS — D696 Thrombocytopenia, unspecified: Secondary | ICD-10-CM | POA: Diagnosis not present

## 2017-09-09 DIAGNOSIS — N401 Enlarged prostate with lower urinary tract symptoms: Secondary | ICD-10-CM | POA: Diagnosis not present

## 2017-09-09 DIAGNOSIS — J309 Allergic rhinitis, unspecified: Secondary | ICD-10-CM | POA: Diagnosis not present

## 2017-09-09 DIAGNOSIS — I700 Atherosclerosis of aorta: Secondary | ICD-10-CM | POA: Diagnosis not present

## 2017-09-09 DIAGNOSIS — E7800 Pure hypercholesterolemia, unspecified: Secondary | ICD-10-CM | POA: Diagnosis not present

## 2017-09-09 DIAGNOSIS — K760 Fatty (change of) liver, not elsewhere classified: Secondary | ICD-10-CM | POA: Diagnosis not present

## 2017-09-09 DIAGNOSIS — E538 Deficiency of other specified B group vitamins: Secondary | ICD-10-CM | POA: Diagnosis not present

## 2017-09-09 DIAGNOSIS — Z0000 Encounter for general adult medical examination without abnormal findings: Secondary | ICD-10-CM | POA: Diagnosis not present

## 2017-11-08 DIAGNOSIS — N390 Urinary tract infection, site not specified: Secondary | ICD-10-CM | POA: Diagnosis not present

## 2017-11-08 DIAGNOSIS — R350 Frequency of micturition: Secondary | ICD-10-CM | POA: Diagnosis not present

## 2017-12-29 ENCOUNTER — Ambulatory Visit
Admission: RE | Admit: 2017-12-29 | Discharge: 2017-12-29 | Disposition: A | Discharge status: Home / Self Care | Payer: Medicare Other | Source: Ambulatory Visit | Attending: Internal Medicine | Admitting: Internal Medicine

## 2017-12-29 ENCOUNTER — Other Ambulatory Visit: Payer: Self-pay | Admitting: Internal Medicine

## 2017-12-29 DIAGNOSIS — M79643 Pain in unspecified hand: Secondary | ICD-10-CM | POA: Diagnosis not present

## 2017-12-29 DIAGNOSIS — M79641 Pain in right hand: Secondary | ICD-10-CM

## 2017-12-29 DIAGNOSIS — M79642 Pain in left hand: Principal | ICD-10-CM

## 2017-12-29 DIAGNOSIS — M1811 Unilateral primary osteoarthritis of first carpometacarpal joint, right hand: Secondary | ICD-10-CM | POA: Diagnosis not present

## 2017-12-29 DIAGNOSIS — M1812 Unilateral primary osteoarthritis of first carpometacarpal joint, left hand: Secondary | ICD-10-CM | POA: Diagnosis not present

## 2018-01-31 DIAGNOSIS — H04123 Dry eye syndrome of bilateral lacrimal glands: Secondary | ICD-10-CM | POA: Diagnosis not present

## 2018-02-02 DIAGNOSIS — G4733 Obstructive sleep apnea (adult) (pediatric): Secondary | ICD-10-CM | POA: Diagnosis not present

## 2018-03-22 DIAGNOSIS — Z23 Encounter for immunization: Secondary | ICD-10-CM | POA: Diagnosis not present

## 2018-03-22 DIAGNOSIS — E1169 Type 2 diabetes mellitus with other specified complication: Secondary | ICD-10-CM | POA: Diagnosis not present

## 2018-03-22 DIAGNOSIS — Z7984 Long term (current) use of oral hypoglycemic drugs: Secondary | ICD-10-CM | POA: Diagnosis not present

## 2018-03-22 DIAGNOSIS — I10 Essential (primary) hypertension: Secondary | ICD-10-CM | POA: Diagnosis not present

## 2018-03-22 DIAGNOSIS — Z6841 Body Mass Index (BMI) 40.0 and over, adult: Secondary | ICD-10-CM | POA: Diagnosis not present

## 2018-03-22 DIAGNOSIS — G4733 Obstructive sleep apnea (adult) (pediatric): Secondary | ICD-10-CM | POA: Diagnosis not present

## 2018-03-22 DIAGNOSIS — D696 Thrombocytopenia, unspecified: Secondary | ICD-10-CM | POA: Diagnosis not present

## 2018-03-22 DIAGNOSIS — E7800 Pure hypercholesterolemia, unspecified: Secondary | ICD-10-CM | POA: Diagnosis not present

## 2018-03-22 DIAGNOSIS — N400 Enlarged prostate without lower urinary tract symptoms: Secondary | ICD-10-CM | POA: Diagnosis not present

## 2018-03-29 DIAGNOSIS — M47816 Spondylosis without myelopathy or radiculopathy, lumbar region: Secondary | ICD-10-CM | POA: Diagnosis not present

## 2018-04-05 DIAGNOSIS — M47816 Spondylosis without myelopathy or radiculopathy, lumbar region: Secondary | ICD-10-CM | POA: Diagnosis not present

## 2018-04-26 DIAGNOSIS — H25013 Cortical age-related cataract, bilateral: Secondary | ICD-10-CM | POA: Diagnosis not present

## 2018-04-26 DIAGNOSIS — H04123 Dry eye syndrome of bilateral lacrimal glands: Secondary | ICD-10-CM | POA: Diagnosis not present

## 2018-04-26 DIAGNOSIS — Z7984 Long term (current) use of oral hypoglycemic drugs: Secondary | ICD-10-CM | POA: Diagnosis not present

## 2018-04-26 DIAGNOSIS — H2513 Age-related nuclear cataract, bilateral: Secondary | ICD-10-CM | POA: Diagnosis not present

## 2018-04-26 DIAGNOSIS — E119 Type 2 diabetes mellitus without complications: Secondary | ICD-10-CM | POA: Diagnosis not present

## 2018-04-26 DIAGNOSIS — H524 Presbyopia: Secondary | ICD-10-CM | POA: Diagnosis not present

## 2018-05-11 DIAGNOSIS — H2511 Age-related nuclear cataract, right eye: Secondary | ICD-10-CM | POA: Diagnosis not present

## 2018-05-11 DIAGNOSIS — H25012 Cortical age-related cataract, left eye: Secondary | ICD-10-CM | POA: Diagnosis not present

## 2018-05-11 DIAGNOSIS — H25011 Cortical age-related cataract, right eye: Secondary | ICD-10-CM | POA: Diagnosis not present

## 2018-05-11 DIAGNOSIS — H2512 Age-related nuclear cataract, left eye: Secondary | ICD-10-CM | POA: Diagnosis not present

## 2018-05-18 DIAGNOSIS — H5703 Miosis: Secondary | ICD-10-CM | POA: Diagnosis not present

## 2018-05-18 DIAGNOSIS — H25011 Cortical age-related cataract, right eye: Secondary | ICD-10-CM | POA: Diagnosis not present

## 2018-05-18 DIAGNOSIS — H2511 Age-related nuclear cataract, right eye: Secondary | ICD-10-CM | POA: Diagnosis not present

## 2018-05-31 ENCOUNTER — Encounter: Payer: Self-pay | Admitting: Hematology

## 2018-05-31 ENCOUNTER — Telehealth: Payer: Self-pay | Admitting: Hematology

## 2018-05-31 NOTE — Telephone Encounter (Signed)
A hematology appt has been scheduled for the pt to see Dr. Irene Limbo on 3/9 at 10am. Pt aware to arrive 30 minutes early. Letter mailed.

## 2018-06-24 NOTE — Progress Notes (Signed)
HEMATOLOGY/ONCOLOGY CONSULTATION NOTE  Date of Service: 06/27/2018  Patient Care Team: Wenda Low, MD as PCP - General (Internal Medicine)  CHIEF COMPLAINTS/PURPOSE OF CONSULTATION:  Thrombocytopenia and Anemia  HISTORY OF PRESENTING ILLNESS:   Gregory Estrada is a wonderful 73 y.o. male who has been referred to [place] by Dr. Wenda Low for evaluation and management of thrombocytopenia and anemia. The pt reports that he is doing well overall.   The pt reports that his "energy level is a little low, been sleeping more than normal." He notes that he has recently begun Ozempic in the last 2 months, and has "no idea," if his lower energy levels are related. He denies his fatigue being very bad, and partially attributes this to his age. He uses his CPAP daily. Besides feeling a little more tired, he denies feeling any differently recently as compared to the last 6-12 months. The pt takes an allergy medication over the counter.  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 He now works in the office of an Associate Professor.  The pt notes that he has not been compliant with his B12 replacement for the last year, and has a history of B12 deficiency.  The pt denies any recent concerns for infections, noting his last infection was a year ago.   Most recent lab results (03/23/18) of CBC is as follows: all values are WNL except for RBC at 4.14, HGB at 11.2, HCT at 34.1%, PLT at 125k.  On review of systems, pt reports feeling more tired, staying active, moving his bowels well, and denies recent infections, excessive bleeding, abnormal bruising, new bone pains, abdominal pains, leg swelling, problems passing urine, and any other symptoms.   On PMHx the pt reports Vitamin B12 deficiency, NASH, aortic atherosclerosis, GERD, sleep apnea, morbid obesity, benign non-nodular prostatic hyperplasia with lower urinary tract symptoms.  Denies ever receiving a blood transfusion. On Social Hx the pt reports formerly working as an Clinical biochemist. He denies chemical exposure, denies consuming alcohol, and denies smoking cigarettes.  MEDICAL HISTORY:  Past Medical History:  Diagnosis Date  . Anemia    B 12 DEFICIENCY  . Arthritis   . Back pain   . Diabetes mellitus without complication ([place])   . Hypertension   . Prostate enlargement   . Sleep apnea    USES C-PAP    SURGICAL HISTORY: Past Surgical History:  Procedure Laterality Date  . APPENDECTOMY    . BACK SURGERY    . REFRACTIVE SURGERY  2008   bilateral eyes  . ROTATOR CUFF REPAIR     RT  . TOTAL KNEE ARTHROPLASTY  02/29/2012   Procedure: TOTAL KNEE ARTHROPLASTY;  Surgeon: Gearlean Alf, MD;  Location: [facility];  Service: Orthopedics;  Laterality: Left;  . TOTAL KNEE ARTHROPLASTY Right 09/26/2012   Procedure: RIGHT TOTAL KNEE ARTHROPLASTY;  Surgeon: Gearlean Alf, MD;  Location: [facility];  Service: Orthopedics;  Laterality: Right;    SOCIAL HISTORY: Social History   Socioeconomic History  . Marital status: Divorced    Spouse name: Not on file  . Number of children: Not on file  . Years of education: Not on file  . Highest education level: Not on file  Occupational History  . Not on file  Social Needs  . Financial resource strain: Not on file  . Food insecurity:    Worry: Not on file    Inability: Not on  file  . Transportation needs:    Medical: Not on file    Non-medical: Not on file  Tobacco Use  . Smoking status: Former Smoker    Last attempt to quit: 02/22/1981    Years since quitting: 37.3  . Smokeless tobacco: Never Used  Substance and Sexual Activity  . Alcohol use: No  . Drug use: No  . Sexual activity: Not on file  Lifestyle  . Physical activity:    Days per week: Not on file    Minutes per session: Not on file  . Stress: Not on file  Relationships  . Social connections:    Talks on phone: Not on file    Gets together: Not on file     Attends religious service: Not on file    Active member of club or organization: Not on file    Attends meetings of clubs or organizations: Not on file    Relationship status: Not on file  . Intimate partner violence:    Fear of current or ex partner: Not on file    Emotionally abused: Not on file    Physically abused: Not on file    Forced sexual activity: Not on file  Other Topics Concern  . Not on file  Social History Narrative  . Not on file    FAMILY HISTORY: No family history on file.  ALLERGIES:  is allergic to flomax [tamsulosin].  MEDICATIONS:  Current Outpatient Medications  Medication Sig Dispense Refill  . acetaminophen (TYLENOL) 325 MG tablet Take 2 tablets (650 mg total) by mouth every 6 (six) hours as needed. 60 tablet 0  . alum & mag hydroxide-simeth (MAALOX/MYLANTA) 200-200-20 MG/5ML suspension Take 30 mLs by mouth every 6 (six) hours as needed. (Patient not taking: Reported on 06/11/2014) 355 mL 0  . bisacodyl (DULCOLAX) 10 MG suppository Place 1 suppository (10 mg total) rectally daily as needed. 12 suppository 0  . diphenhydrAMINE (BENADRYL) 12.5 MG/5ML elixir Take 5-10 mLs (12.5-25 mg total) by mouth every 4 (four) hours as needed for itching. (Patient not taking: Reported on 06/11/2014) 120 mL 0  . docusate sodium 100 MG CAPS Take 100 mg by mouth 2 (two) times daily. (Patient not taking: Reported on 06/11/2014) 60 capsule 0  . doxazosin (CARDURA) 4 MG tablet Take 4 mg by mouth every evening.    Marland Kitchen glipiZIDE (GLUCOTROL) 5 MG tablet Take by mouth daily before breakfast.    . lisinopril (PRINIVIL,ZESTRIL) 10 MG tablet Take 10 mg by mouth every morning.     . methocarbamol (ROBAXIN) 500 MG tablet Take 1 tablet (500 mg total) by mouth every 6 (six) hours as needed. (Patient not taking: Reported on 06/11/2014) 80 tablet 0  . methylcellulose (ARTIFICIAL TEARS) 1 % ophthalmic solution Place 1 drop into both eyes daily as needed (for dry eyes).    . metoCLOPramide  (REGLAN) 5 MG tablet Take 1-2 tablets (5-10 mg total) by mouth every 6 (six) hours as needed (if ondansetron (ZOFRAN) ineffective.). (Patient not taking: Reported on 06/11/2014) 40 tablet 0  . ondansetron (ZOFRAN) 4 MG tablet Take 1 tablet (4 mg total) by mouth every 6 (six) hours as needed for nausea. (Patient not taking: Reported on 06/11/2014) 40 tablet 0  . oxyCODONE (OXY IR/ROXICODONE) 5 MG immediate release tablet Take 1 tablet every 8 hours . Take 1 to 2 tablets every 3 hours as needed (Patient not taking: Reported on 06/11/2014) 180 tablet 0  . polyethylene glycol (MIRALAX / GLYCOLAX) packet Take 17  g by mouth daily as needed. 14 each 0  . rivaroxaban (XARELTO) 10 MG TABS tablet Take 1 tablet (10 mg total) by mouth daily with breakfast. Take Xarelto for two and a half more weeks, then discontinue Xarelto. (Patient not taking: Reported on 06/11/2014) 18 tablet 0  . Saxagliptin-Metformin (KOMBIGLYZE XR) 2.08-998 MG TB24 Take 2 tablets by mouth at bedtime.     . tamsulosin (FLOMAX) 0.4 MG CAPS capsule Take 0.4 mg by mouth.    . traMADol (ULTRAM) 50 MG tablet Take 1-2 tablets (50-100 mg total) by mouth every 6 (six) hours as needed (mild pain). (Patient not taking: Reported on 06/11/2014) 60 tablet 0  . vitamin B-12 (CYANOCOBALAMIN) 1000 MCG tablet Take 1,000 mcg by mouth daily.     No current facility-administered medications for this visit.     REVIEW OF SYSTEMS:    10 Point review of Systems was done is negative except as noted above.  PHYSICAL EXAMINATION:  . Vitals:   06/27/18 1034  BP: (!) 151/56  Pulse: 92  Resp: 18  Temp: 98.3 F (36.8 C)  SpO2: 100%   Filed Weights   06/27/18 1034  Weight: (!) 322 lb 1.6 oz (146.1 kg)   .Body mass index is 43.68 kg/m.  GENERAL:alert, in no acute distress and comfortable SKIN: no acute rashes, no significant lesions EYES: conjunctiva are pink and non-injected, sclera anicteric OROPHARYNX: MMM, no exudates, no oropharyngeal erythema or  ulceration NECK: supple, no JVD LYMPH:  no palpable lymphadenopathy in the cervical, axillary or inguinal regions LUNGS: clear to auscultation b/l with normal respiratory effort HEART: regular rate & rhythm ABDOMEN:  normoactive bowel sounds , non tender, not distended. Extremity: no pedal edema PSYCH: alert & oriented x 3 with fluent speech NEURO: no focal motor/sensory deficits  LABORATORY DATA:  I have reviewed the data as listed  . CBC Latest Ref Rng & Units 09/29/2012 09/28/2012 09/27/2012  WBC 4.0 - 10.5 K/uL 7.0 10.5 8.4  Hemoglobin 13.0 - 17.0 g/dL 9.2(L) 10.4(L) 9.5(L)  Hematocrit 39.0 - 52.0 % 28.3(L) 31.8(L) 30.0(L)  Platelets 150 - 400 K/uL 131(L) 145(L) 128(L)    . CMP Latest Ref Rng & Units 09/28/2012 09/27/2012 09/20/2012  Glucose 70 - 99 mg/dL 178(H) 133(H) 141(H)  BUN 6 - 23 mg/dL 13 20 19   Creatinine 0.50 - 1.35 mg/dL 0.78 0.94 0.97  Sodium 135 - 145 mEq/L 134(L) 130(L) 134(L)  Potassium 3.5 - 5.1 mEq/L 4.1 4.5 4.7  Chloride 96 - 112 mEq/L 101 98 101  CO2 19 - 32 mEq/L 25 25 24   Calcium 8.4 - 10.5 mg/dL 8.9 8.4 9.8  Total Protein 6.0 - 8.3 g/dL - - 7.1  Total Bilirubin 0.3 - 1.2 mg/dL - - 0.3  Alkaline Phos 39 - 117 U/L - - 104  AST 0 - 37 U/L - - 19  ALT 0 - 53 U/L - - 29   03/23/18 CBC:    RADIOGRAPHIC STUDIES: I have personally reviewed the radiological images as listed and agreed with the findings in the report. No results found.  ASSESSMENT & PLAN:  73 y.o. male with  1. Thrombocytopenia 2. Anemia  PLAN: -Discussed patient's most recent labs from 03/23/18, HGB at 11.2, PLT at 125k -Reviewed previous labs from 09/10/17 which revealed HGB at 11.3, PLT at 123k -Reviewed 09/01/16 HGB at 11.4, PLT at 122k -12/31/15 HGB at 11.4, PLT at 148k -Discussed that the patient's stability of mild anemia and mild thrombocytopenia are not overtly concerning for  a primary bone marrow disorder, and do not place him at an increased risk of bleeding -Discussed that the  patient's Vitamin B12 deficiency could be related to his slightly low PLT and HGB, and Metformin is a risk factor for this deficiency -Discussed that the patient's NASH can be a source of mild cytopenias as well. -Suspect some medication effect as well -Recommend SL 1084mcg Vitamin B12 preparation -Recommend a general multivitamin once a day as well -Offered blood tests today, the pt prefers to follow up with PCP -Will see the pt back as needed if significantly worsening cytopenias if hgb<10 or platelets<100k   RTC with Dr Irene Limbo as needed   All of the patients questions were answered with apparent satisfaction. The patient knows to call the clinic with any problems, questions or concerns.  The total time spent in the appt was 35 minutes and more than 50% was on counseling and direct patient cares.    Sullivan Lone MD MS AAHIVMS [facility] Hematology/Oncology Physician [facility]  (Office):       [phone] (Work cell):  [phone] (Fax):           [phone]  06/27/2018 11:11 AM  I, Baldwin Jamaica, am acting as a scribe for Dr. Sullivan Lone.   .I have reviewed the above documentation for accuracy and completeness, and I agree with the above. Brunetta Genera MD

## 2018-06-27 ENCOUNTER — Inpatient Hospital Stay: Payer: Medicare Other | Attending: Hematology | Admitting: Hematology

## 2018-06-27 ENCOUNTER — Telehealth: Payer: Self-pay | Admitting: Hematology

## 2018-06-27 VITALS — BP 151/56 | HR 92 | Temp 98.3°F | Resp 18 | Ht 72.0 in | Wt 322.1 lb

## 2018-06-27 DIAGNOSIS — E119 Type 2 diabetes mellitus without complications: Secondary | ICD-10-CM | POA: Diagnosis not present

## 2018-06-27 DIAGNOSIS — Z79899 Other long term (current) drug therapy: Secondary | ICD-10-CM | POA: Diagnosis not present

## 2018-06-27 DIAGNOSIS — Z87891 Personal history of nicotine dependence: Secondary | ICD-10-CM | POA: Insufficient documentation

## 2018-06-27 DIAGNOSIS — G4730 Sleep apnea, unspecified: Secondary | ICD-10-CM | POA: Insufficient documentation

## 2018-06-27 DIAGNOSIS — D696 Thrombocytopenia, unspecified: Secondary | ICD-10-CM | POA: Diagnosis not present

## 2018-06-27 DIAGNOSIS — Z7984 Long term (current) use of oral hypoglycemic drugs: Secondary | ICD-10-CM | POA: Diagnosis not present

## 2018-06-27 DIAGNOSIS — M1990 Unspecified osteoarthritis, unspecified site: Secondary | ICD-10-CM | POA: Insufficient documentation

## 2018-06-27 DIAGNOSIS — I10 Essential (primary) hypertension: Secondary | ICD-10-CM | POA: Diagnosis not present

## 2018-06-27 DIAGNOSIS — N401 Enlarged prostate with lower urinary tract symptoms: Secondary | ICD-10-CM | POA: Insufficient documentation

## 2018-06-27 DIAGNOSIS — E538 Deficiency of other specified B group vitamins: Secondary | ICD-10-CM | POA: Diagnosis not present

## 2018-06-27 DIAGNOSIS — D649 Anemia, unspecified: Secondary | ICD-10-CM | POA: Insufficient documentation

## 2018-06-27 DIAGNOSIS — K219 Gastro-esophageal reflux disease without esophagitis: Secondary | ICD-10-CM | POA: Insufficient documentation

## 2018-06-27 NOTE — Telephone Encounter (Signed)
Per 3/9 los RTC with Dr Irene Limbo as needed

## 2018-07-18 DIAGNOSIS — M549 Dorsalgia, unspecified: Secondary | ICD-10-CM | POA: Diagnosis not present

## 2018-07-18 DIAGNOSIS — Z7984 Long term (current) use of oral hypoglycemic drugs: Secondary | ICD-10-CM | POA: Diagnosis not present

## 2018-07-18 DIAGNOSIS — E1169 Type 2 diabetes mellitus with other specified complication: Secondary | ICD-10-CM | POA: Diagnosis not present

## 2018-07-18 DIAGNOSIS — M79673 Pain in unspecified foot: Secondary | ICD-10-CM | POA: Diagnosis not present

## 2018-08-03 DIAGNOSIS — E1169 Type 2 diabetes mellitus with other specified complication: Secondary | ICD-10-CM | POA: Diagnosis not present

## 2018-08-03 DIAGNOSIS — J309 Allergic rhinitis, unspecified: Secondary | ICD-10-CM | POA: Diagnosis not present

## 2018-08-03 DIAGNOSIS — Z7984 Long term (current) use of oral hypoglycemic drugs: Secondary | ICD-10-CM | POA: Diagnosis not present

## 2018-08-03 DIAGNOSIS — K7581 Nonalcoholic steatohepatitis (NASH): Secondary | ICD-10-CM | POA: Diagnosis not present

## 2018-08-03 DIAGNOSIS — R5383 Other fatigue: Secondary | ICD-10-CM | POA: Diagnosis not present

## 2018-08-03 DIAGNOSIS — D696 Thrombocytopenia, unspecified: Secondary | ICD-10-CM | POA: Diagnosis not present

## 2018-08-03 DIAGNOSIS — E1165 Type 2 diabetes mellitus with hyperglycemia: Secondary | ICD-10-CM | POA: Diagnosis not present

## 2018-08-03 DIAGNOSIS — E538 Deficiency of other specified B group vitamins: Secondary | ICD-10-CM | POA: Diagnosis not present

## 2018-08-10 DIAGNOSIS — J309 Allergic rhinitis, unspecified: Secondary | ICD-10-CM | POA: Diagnosis not present

## 2018-08-10 DIAGNOSIS — G8929 Other chronic pain: Secondary | ICD-10-CM | POA: Diagnosis not present

## 2018-08-10 DIAGNOSIS — Z7984 Long term (current) use of oral hypoglycemic drugs: Secondary | ICD-10-CM | POA: Diagnosis not present

## 2018-08-10 DIAGNOSIS — N400 Enlarged prostate without lower urinary tract symptoms: Secondary | ICD-10-CM | POA: Diagnosis not present

## 2018-08-10 DIAGNOSIS — E119 Type 2 diabetes mellitus without complications: Secondary | ICD-10-CM | POA: Diagnosis not present

## 2018-08-10 DIAGNOSIS — I10 Essential (primary) hypertension: Secondary | ICD-10-CM | POA: Diagnosis not present

## 2018-08-10 DIAGNOSIS — E7800 Pure hypercholesterolemia, unspecified: Secondary | ICD-10-CM | POA: Diagnosis not present

## 2018-08-10 DIAGNOSIS — K219 Gastro-esophageal reflux disease without esophagitis: Secondary | ICD-10-CM | POA: Diagnosis not present

## 2018-08-10 DIAGNOSIS — E538 Deficiency of other specified B group vitamins: Secondary | ICD-10-CM | POA: Diagnosis not present

## 2018-08-17 DIAGNOSIS — E1169 Type 2 diabetes mellitus with other specified complication: Secondary | ICD-10-CM | POA: Diagnosis not present

## 2018-08-17 DIAGNOSIS — M1990 Unspecified osteoarthritis, unspecified site: Secondary | ICD-10-CM | POA: Diagnosis not present

## 2018-08-17 DIAGNOSIS — E7800 Pure hypercholesterolemia, unspecified: Secondary | ICD-10-CM | POA: Diagnosis not present

## 2018-08-17 DIAGNOSIS — Z7984 Long term (current) use of oral hypoglycemic drugs: Secondary | ICD-10-CM | POA: Diagnosis not present

## 2018-08-17 DIAGNOSIS — I10 Essential (primary) hypertension: Secondary | ICD-10-CM | POA: Diagnosis not present

## 2018-08-17 DIAGNOSIS — N401 Enlarged prostate with lower urinary tract symptoms: Secondary | ICD-10-CM | POA: Diagnosis not present

## 2018-08-17 DIAGNOSIS — E119 Type 2 diabetes mellitus without complications: Secondary | ICD-10-CM | POA: Diagnosis not present

## 2018-08-25 DIAGNOSIS — Z794 Long term (current) use of insulin: Secondary | ICD-10-CM | POA: Diagnosis not present

## 2018-08-25 DIAGNOSIS — E1169 Type 2 diabetes mellitus with other specified complication: Secondary | ICD-10-CM | POA: Diagnosis not present

## 2018-08-25 DIAGNOSIS — E119 Type 2 diabetes mellitus without complications: Secondary | ICD-10-CM | POA: Diagnosis not present

## 2018-09-07 DIAGNOSIS — N401 Enlarged prostate with lower urinary tract symptoms: Secondary | ICD-10-CM | POA: Diagnosis not present

## 2018-09-07 DIAGNOSIS — E538 Deficiency of other specified B group vitamins: Secondary | ICD-10-CM | POA: Diagnosis not present

## 2018-09-08 DIAGNOSIS — E1169 Type 2 diabetes mellitus with other specified complication: Secondary | ICD-10-CM | POA: Diagnosis not present

## 2018-09-08 DIAGNOSIS — N401 Enlarged prostate with lower urinary tract symptoms: Secondary | ICD-10-CM | POA: Diagnosis not present

## 2018-09-08 DIAGNOSIS — I10 Essential (primary) hypertension: Secondary | ICD-10-CM | POA: Diagnosis not present

## 2018-09-08 DIAGNOSIS — Z7984 Long term (current) use of oral hypoglycemic drugs: Secondary | ICD-10-CM | POA: Diagnosis not present

## 2018-09-08 DIAGNOSIS — M1990 Unspecified osteoarthritis, unspecified site: Secondary | ICD-10-CM | POA: Diagnosis not present

## 2018-09-08 DIAGNOSIS — E7800 Pure hypercholesterolemia, unspecified: Secondary | ICD-10-CM | POA: Diagnosis not present

## 2018-09-08 DIAGNOSIS — E119 Type 2 diabetes mellitus without complications: Secondary | ICD-10-CM | POA: Diagnosis not present

## 2018-09-14 DIAGNOSIS — N390 Urinary tract infection, site not specified: Secondary | ICD-10-CM | POA: Diagnosis not present

## 2018-09-20 DIAGNOSIS — G4733 Obstructive sleep apnea (adult) (pediatric): Secondary | ICD-10-CM | POA: Diagnosis not present

## 2018-09-20 DIAGNOSIS — E538 Deficiency of other specified B group vitamins: Secondary | ICD-10-CM | POA: Diagnosis not present

## 2018-09-20 DIAGNOSIS — I10 Essential (primary) hypertension: Secondary | ICD-10-CM | POA: Diagnosis not present

## 2018-09-20 DIAGNOSIS — Z7984 Long term (current) use of oral hypoglycemic drugs: Secondary | ICD-10-CM | POA: Diagnosis not present

## 2018-09-20 DIAGNOSIS — M109 Gout, unspecified: Secondary | ICD-10-CM | POA: Diagnosis not present

## 2018-09-20 DIAGNOSIS — Z1389 Encounter for screening for other disorder: Secondary | ICD-10-CM | POA: Diagnosis not present

## 2018-09-20 DIAGNOSIS — D696 Thrombocytopenia, unspecified: Secondary | ICD-10-CM | POA: Diagnosis not present

## 2018-09-20 DIAGNOSIS — N401 Enlarged prostate with lower urinary tract symptoms: Secondary | ICD-10-CM | POA: Diagnosis not present

## 2018-09-20 DIAGNOSIS — R8290 Unspecified abnormal findings in urine: Secondary | ICD-10-CM | POA: Diagnosis not present

## 2018-09-20 DIAGNOSIS — E7800 Pure hypercholesterolemia, unspecified: Secondary | ICD-10-CM | POA: Diagnosis not present

## 2018-09-20 DIAGNOSIS — E1169 Type 2 diabetes mellitus with other specified complication: Secondary | ICD-10-CM | POA: Diagnosis not present

## 2018-09-20 DIAGNOSIS — K7581 Nonalcoholic steatohepatitis (NASH): Secondary | ICD-10-CM | POA: Diagnosis not present

## 2018-09-20 DIAGNOSIS — I700 Atherosclerosis of aorta: Secondary | ICD-10-CM | POA: Diagnosis not present

## 2018-09-20 DIAGNOSIS — Z0000 Encounter for general adult medical examination without abnormal findings: Secondary | ICD-10-CM | POA: Diagnosis not present

## 2018-09-26 DIAGNOSIS — E538 Deficiency of other specified B group vitamins: Secondary | ICD-10-CM | POA: Diagnosis not present

## 2018-10-03 DIAGNOSIS — E538 Deficiency of other specified B group vitamins: Secondary | ICD-10-CM | POA: Diagnosis not present

## 2018-10-11 DIAGNOSIS — Z0100 Encounter for examination of eyes and vision without abnormal findings: Secondary | ICD-10-CM | POA: Diagnosis not present

## 2018-10-11 DIAGNOSIS — E538 Deficiency of other specified B group vitamins: Secondary | ICD-10-CM | POA: Diagnosis not present

## 2018-10-11 DIAGNOSIS — E291 Testicular hypofunction: Secondary | ICD-10-CM | POA: Diagnosis not present

## 2018-10-11 DIAGNOSIS — Z961 Presence of intraocular lens: Secondary | ICD-10-CM | POA: Diagnosis not present

## 2018-10-18 DIAGNOSIS — E7800 Pure hypercholesterolemia, unspecified: Secondary | ICD-10-CM | POA: Diagnosis not present

## 2018-10-18 DIAGNOSIS — N401 Enlarged prostate with lower urinary tract symptoms: Secondary | ICD-10-CM | POA: Diagnosis not present

## 2018-10-18 DIAGNOSIS — M1990 Unspecified osteoarthritis, unspecified site: Secondary | ICD-10-CM | POA: Diagnosis not present

## 2018-10-18 DIAGNOSIS — E119 Type 2 diabetes mellitus without complications: Secondary | ICD-10-CM | POA: Diagnosis not present

## 2018-10-18 DIAGNOSIS — E1169 Type 2 diabetes mellitus with other specified complication: Secondary | ICD-10-CM | POA: Diagnosis not present

## 2018-10-18 DIAGNOSIS — I10 Essential (primary) hypertension: Secondary | ICD-10-CM | POA: Diagnosis not present

## 2018-10-18 DIAGNOSIS — Z794 Long term (current) use of insulin: Secondary | ICD-10-CM | POA: Diagnosis not present

## 2018-10-18 DIAGNOSIS — E538 Deficiency of other specified B group vitamins: Secondary | ICD-10-CM | POA: Diagnosis not present

## 2018-10-25 ENCOUNTER — Other Ambulatory Visit: Payer: Self-pay

## 2018-10-25 ENCOUNTER — Other Ambulatory Visit: Payer: Self-pay | Admitting: Internal Medicine

## 2018-10-25 ENCOUNTER — Ambulatory Visit
Admission: RE | Admit: 2018-10-25 | Discharge: 2018-10-25 | Disposition: A | Discharge status: Home / Self Care | Payer: Medicare Other | Source: Ambulatory Visit | Attending: Internal Medicine | Admitting: Internal Medicine

## 2018-10-25 DIAGNOSIS — Z794 Long term (current) use of insulin: Secondary | ICD-10-CM | POA: Diagnosis not present

## 2018-10-25 DIAGNOSIS — E119 Type 2 diabetes mellitus without complications: Secondary | ICD-10-CM | POA: Diagnosis not present

## 2018-10-25 DIAGNOSIS — M79606 Pain in leg, unspecified: Secondary | ICD-10-CM

## 2018-10-25 DIAGNOSIS — M79604 Pain in right leg: Secondary | ICD-10-CM | POA: Diagnosis not present

## 2018-10-25 DIAGNOSIS — M4317 Spondylolisthesis, lumbosacral region: Secondary | ICD-10-CM | POA: Diagnosis not present

## 2018-10-25 DIAGNOSIS — M4726 Other spondylosis with radiculopathy, lumbar region: Secondary | ICD-10-CM | POA: Diagnosis not present

## 2018-11-04 DIAGNOSIS — M1990 Unspecified osteoarthritis, unspecified site: Secondary | ICD-10-CM | POA: Diagnosis not present

## 2018-11-04 DIAGNOSIS — E1169 Type 2 diabetes mellitus with other specified complication: Secondary | ICD-10-CM | POA: Diagnosis not present

## 2018-11-04 DIAGNOSIS — E119 Type 2 diabetes mellitus without complications: Secondary | ICD-10-CM | POA: Diagnosis not present

## 2018-11-04 DIAGNOSIS — E7800 Pure hypercholesterolemia, unspecified: Secondary | ICD-10-CM | POA: Diagnosis not present

## 2018-11-04 DIAGNOSIS — N400 Enlarged prostate without lower urinary tract symptoms: Secondary | ICD-10-CM | POA: Diagnosis not present

## 2018-11-04 DIAGNOSIS — I10 Essential (primary) hypertension: Secondary | ICD-10-CM | POA: Diagnosis not present

## 2018-11-04 DIAGNOSIS — N401 Enlarged prostate with lower urinary tract symptoms: Secondary | ICD-10-CM | POA: Diagnosis not present

## 2018-11-10 DIAGNOSIS — R350 Frequency of micturition: Secondary | ICD-10-CM | POA: Diagnosis not present

## 2018-11-11 DIAGNOSIS — R350 Frequency of micturition: Secondary | ICD-10-CM | POA: Diagnosis not present

## 2018-11-21 DIAGNOSIS — E538 Deficiency of other specified B group vitamins: Secondary | ICD-10-CM | POA: Diagnosis not present

## 2018-11-21 DIAGNOSIS — N400 Enlarged prostate without lower urinary tract symptoms: Secondary | ICD-10-CM | POA: Diagnosis not present

## 2018-11-21 DIAGNOSIS — R7989 Other specified abnormal findings of blood chemistry: Secondary | ICD-10-CM | POA: Diagnosis not present

## 2018-12-13 DIAGNOSIS — L600 Ingrowing nail: Secondary | ICD-10-CM | POA: Diagnosis not present

## 2018-12-27 DIAGNOSIS — E291 Testicular hypofunction: Secondary | ICD-10-CM | POA: Diagnosis not present

## 2018-12-27 DIAGNOSIS — E538 Deficiency of other specified B group vitamins: Secondary | ICD-10-CM | POA: Diagnosis not present

## 2018-12-28 ENCOUNTER — Ambulatory Visit (INDEPENDENT_AMBULATORY_CARE_PROVIDER_SITE_OTHER): Payer: Medicare Other | Admitting: Podiatry

## 2018-12-28 ENCOUNTER — Other Ambulatory Visit: Payer: Self-pay | Admitting: Podiatry

## 2018-12-28 ENCOUNTER — Encounter: Payer: Self-pay | Admitting: Podiatry

## 2018-12-28 ENCOUNTER — Other Ambulatory Visit: Payer: Self-pay

## 2018-12-28 ENCOUNTER — Ambulatory Visit (INDEPENDENT_AMBULATORY_CARE_PROVIDER_SITE_OTHER): Payer: Medicare Other

## 2018-12-28 VITALS — BP 133/58 | HR 70

## 2018-12-28 DIAGNOSIS — M65971 Unspecified synovitis and tenosynovitis, right ankle and foot: Secondary | ICD-10-CM

## 2018-12-28 DIAGNOSIS — M659 Synovitis and tenosynovitis, unspecified: Secondary | ICD-10-CM

## 2018-12-28 DIAGNOSIS — M65972 Unspecified synovitis and tenosynovitis, left ankle and foot: Secondary | ICD-10-CM

## 2018-12-28 DIAGNOSIS — L600 Ingrowing nail: Secondary | ICD-10-CM

## 2018-12-28 MED ORDER — GENTAMICIN SULFATE 0.1 % EX CREA: 1.0000 "application " | TOPICAL_CREAM | Freq: Two times a day (BID) | CUTANEOUS | 1 refills | Status: DC

## 2018-12-28 MED ORDER — MELOXICAM 15 MG PO TABS: 15.0000 mg | ORAL_TABLET | Freq: Every day | ORAL | 1 refills | Status: DC

## 2018-12-28 MED ORDER — METHYLPREDNISOLONE 4 MG PO TBPK: ORAL_TABLET | ORAL | 0 refills | Status: DC

## 2018-12-28 NOTE — Patient Instructions (Signed)

## 2018-12-31 NOTE — Progress Notes (Signed)
   Subjective: Patient presents today for evaluation of pain to the medial and lateral borders of the left hallux that began 1-2 weeks ago. He reports associated swelling and redness that has since resolved because he was prescribed antibiotics. Patient is concerned for possible ingrown nail. Touching the toe increases the pain.  He also reports constant aching pain to the bilateral ankle joints that began several months ago. He denies any worsening factors and has tried icing and soaking them for treatment. Patient presents today for further treatment and evaluation.  Past Medical History:  Diagnosis Date  . Anemia    B 12 DEFICIENCY  . Arthritis   . Back pain   . Diabetes mellitus without complication ([place])   . Hypertension   . Prostate enlargement   . Sleep apnea    USES C-PAP    Objective:  General: Well developed, nourished, in no acute distress, alert and oriented x3   Dermatology: Skin is warm, dry and supple bilateral. Medial and lateral borders of the left hallux appears to be erythematous with evidence of an ingrowing nail. Pain on palpation noted to the border of the nail fold. The remaining nails appear unremarkable at this time. There are no open sores, lesions.  Vascular: Dorsalis Pedis artery and Posterior Tibial artery pedal pulses palpable. No lower extremity edema noted.   Neruologic: Grossly intact via light touch bilateral.  Musculoskeletal: Pain with palpation noted to the anterior, lateral and medial borders of the bilateral ankle joints. Muscular strength within normal limits in all groups bilateral. Normal range of motion noted to all pedal and ankle joints.   Radiographic Exam:  Normal osseous mineralization. Joint spaces preserved. No fracture/dislocation/boney destruction.    Assesement: #1 Paronychia with ingrowing nail medial and lateral borders of the left hallux #2 Ankle synovitis / DJD bilateral   Plan of Care:  1. Patient evaluated. X-Rays  reviewed.  2. Discussed treatment alternatives and plan of care. Explained nail avulsion procedure and post procedure course to patient. 3. Patient opted for permanent partial nail avulsion of the medial and lateral borders of the left hallux.  4. Prior to procedure, local anesthesia infiltration utilized using 3 ml of a 50:50 mixture of 2% plain lidocaine and 0.5% plain marcaine in a normal hallux block fashion and a betadine prep performed.  5. Partial permanent nail avulsion with chemical matrixectomy performed using [id number] applications of phenol followed by alcohol flush.  6. Light dressing applied. 7. Injection of 0.5 mLs Celestone Soluspan injected into the bilateral ankle joints.  8. Prescription for Medrol Dose Pak provided to patient. 9. Prescription for Meloxicam provided to patient. 10. Appointment with Liliane Channel, Pedorthist, for custom molded orthotics.  11. Return to clinic in 3 weeks.  Clinical biochemist by trade. Superior hunter.   Edrick Kins, DPM [facility]  Dr. Edrick Kins, [address]                                        [place], [place] [address]                Office [phone]  Fax [phone]

## 2019-01-03 ENCOUNTER — Other Ambulatory Visit: Payer: Medicare Other | Admitting: Orthotics

## 2019-01-04 ENCOUNTER — Telehealth: Payer: Self-pay | Admitting: *Deleted

## 2019-01-04 NOTE — Telephone Encounter (Signed)
I called pt and he states he did right much walking yesterday, today the area is red and swollen. I told pt that the toe may be red, swollen with drainage to varying degrees for the next 3 weeks, to continue the soaks and gentamicin cream and call with status tomorrow. Pt states understanding.

## 2019-01-04 NOTE — Telephone Encounter (Signed)
Pt states he had an ingrown toenail procedure about 1 week ago and today the toe is red at one spot.

## 2019-01-05 ENCOUNTER — Telehealth: Payer: Self-pay | Admitting: *Deleted

## 2019-01-05 ENCOUNTER — Ambulatory Visit (INDEPENDENT_AMBULATORY_CARE_PROVIDER_SITE_OTHER): Payer: Medicare Other | Admitting: Podiatry

## 2019-01-05 ENCOUNTER — Ambulatory Visit: Payer: Medicare Other | Admitting: Orthotics

## 2019-01-05 ENCOUNTER — Other Ambulatory Visit: Payer: Self-pay

## 2019-01-05 VITALS — Temp 98.2°F

## 2019-01-05 DIAGNOSIS — L03032 Cellulitis of left toe: Secondary | ICD-10-CM | POA: Diagnosis not present

## 2019-01-05 DIAGNOSIS — L02612 Cutaneous abscess of left foot: Secondary | ICD-10-CM | POA: Diagnosis not present

## 2019-01-05 DIAGNOSIS — E119 Type 2 diabetes mellitus without complications: Secondary | ICD-10-CM

## 2019-01-05 MED ORDER — GENTAMICIN SULFATE 0.1 % EX CREA: 1.0000 "application " | TOPICAL_CREAM | Freq: Two times a day (BID) | CUTANEOUS | 1 refills | Status: DC

## 2019-01-05 MED ORDER — CEPHALEXIN 500 MG PO CAPS: 500.0000 mg | ORAL_CAPSULE | Freq: Three times a day (TID) | ORAL | 0 refills | Status: DC

## 2019-01-05 NOTE — Telephone Encounter (Signed)
I called pt and he states the toe is swollen and red and painful more so today. I offered pt 1:00pm appt today with clinical nurse. Pt accepted.

## 2019-01-05 NOTE — Progress Notes (Signed)
Subjective: 73 year old male presents the office today for follow-up evaluation after undergoing partial nail avulsion with chemical matricectomy performed on January 07, 2019 with Dr. Amalia Hailey.  He has been soaking Epson salts and applying the gentamicin cream is asking for a refill.  However he has noticed the toe is become sore and very red.  Denies any red streaks.  Ice to the redness is somewhat improved.  He has not seen any purulence.Denies any systemic complaints such as fevers, chills, nausea, vomiting. No acute changes since last appointment, and no other complaints at this time.   Objective: AAO x3, NAD DP/PT pulses palpable bilaterally, CRT less than 3 seconds Status post partial nail avulsion of the left hallux toenail.  There is erythema extending just proximal to the IPJ to the distal aspect of the toe.  Smaller clear drainage expressed from toenail border however no significant purulence.  No ascending cellulitis.  Small blister on the medial aspect of the hallux.  No open lesions or pre-ulcerative lesions.  No pain with calf compression, swelling, warmth, erythema  Assessment: Cellulitis left hallux status post partial nail  Plan: -All treatment options discussed with the patient including all alternatives, risks, complications.  -Prescribed Keflex and refilled with gentamicin cream.  Surgical shoe dispensed.  Continue soaking Epson salts, antibiotic ointment and dressing daily. -Monitoring signs or symptoms of worsening infection go to the ER should any occur or to let us know. -Patient encouraged to call the office with any questions, concerns, change in symptoms.   Trula Slade DPM

## 2019-01-05 NOTE — Telephone Encounter (Signed)
Pt called states he wants to be seen today.

## 2019-01-05 NOTE — Progress Notes (Signed)
Patient wasn't seen

## 2019-01-09 DIAGNOSIS — E119 Type 2 diabetes mellitus without complications: Secondary | ICD-10-CM | POA: Diagnosis not present

## 2019-01-09 DIAGNOSIS — N400 Enlarged prostate without lower urinary tract symptoms: Secondary | ICD-10-CM | POA: Diagnosis not present

## 2019-01-09 DIAGNOSIS — E1169 Type 2 diabetes mellitus with other specified complication: Secondary | ICD-10-CM | POA: Diagnosis not present

## 2019-01-09 DIAGNOSIS — M1990 Unspecified osteoarthritis, unspecified site: Secondary | ICD-10-CM | POA: Diagnosis not present

## 2019-01-09 DIAGNOSIS — N401 Enlarged prostate with lower urinary tract symptoms: Secondary | ICD-10-CM | POA: Diagnosis not present

## 2019-01-09 DIAGNOSIS — E7800 Pure hypercholesterolemia, unspecified: Secondary | ICD-10-CM | POA: Diagnosis not present

## 2019-01-09 DIAGNOSIS — I10 Essential (primary) hypertension: Secondary | ICD-10-CM | POA: Diagnosis not present

## 2019-01-10 ENCOUNTER — Other Ambulatory Visit: Payer: Medicare Other | Admitting: Orthotics

## 2019-01-11 ENCOUNTER — Ambulatory Visit (INDEPENDENT_AMBULATORY_CARE_PROVIDER_SITE_OTHER): Payer: Self-pay | Admitting: Orthotics

## 2019-01-11 ENCOUNTER — Other Ambulatory Visit: Payer: Self-pay

## 2019-01-11 DIAGNOSIS — M659 Synovitis and tenosynovitis, unspecified: Secondary | ICD-10-CM

## 2019-01-11 NOTE — Progress Notes (Signed)
Scanned for f/o due to ankle synov per dr. Jacqualyn Posey.  Plan on deep heel cup for RF stability, arch control and forefoot cushioning.    Patient made aware that not part of Medicare plan b coverage.  Sign financial resposibility form.

## 2019-01-18 ENCOUNTER — Other Ambulatory Visit: Payer: Self-pay

## 2019-01-18 ENCOUNTER — Ambulatory Visit (INDEPENDENT_AMBULATORY_CARE_PROVIDER_SITE_OTHER): Payer: Medicare Other | Admitting: Podiatry

## 2019-01-18 VITALS — Temp 97.5°F

## 2019-01-18 DIAGNOSIS — M659 Synovitis and tenosynovitis, unspecified: Secondary | ICD-10-CM | POA: Diagnosis not present

## 2019-01-18 DIAGNOSIS — L600 Ingrowing nail: Secondary | ICD-10-CM

## 2019-01-18 MED ORDER — DOXYCYCLINE HYCLATE 100 MG PO TABS: 100.0000 mg | ORAL_TABLET | Freq: Two times a day (BID) | ORAL | 0 refills | Status: DC

## 2019-01-18 MED ORDER — DICLOFENAC SODIUM 75 MG PO TBEC: 75.0000 mg | DELAYED_RELEASE_TABLET | Freq: Two times a day (BID) | ORAL | 1 refills | Status: DC

## 2019-01-19 NOTE — Progress Notes (Signed)
   Subjective: 73 y.o. male presents today status post permanent nail avulsion procedure of the lateral border of the left hallux that was performed on 01/05/2019. He reports some continued soreness and erythema. He reports some minor associated drainage. He states he has taken the Keflex as directed which has helped improve the infection. Patient is here for further evaluation and treatment.   Past Medical History:  Diagnosis Date  . Anemia    B 12 DEFICIENCY  . Arthritis   . Back pain   . Diabetes mellitus without complication ([place])   . Hypertension   . Prostate enlargement   . Sleep apnea    USES C-PAP    Objective: Skin is warm, dry and supple. Nail and respective nail fold appears to be healing appropriately. Open wound to the associated nail fold with a granular wound base and moderate amount of fibrotic tissue. Minimal drainage noted. Mild erythema around the periungual region likely due to phenol chemical matricectomy.  Lateral border of the right hallux appears to be erythematous with evidence of an ingrowing nail. Pain on palpation noted to the border of the nail fold. No signs of infection.  Pain with palpation noted to the medial, anterior and lateral aspects of the bilateral ankles.   Assessment: #1 postop permanent partial nail avulsion lateral border left hallux #2 open wound periungual nail fold of respective digit.  #3 ankle synovitis bilateral  #4 Paronychia with ingrowing nail lateral border right hallux #5 Pain in toe #6 Incurvated nail  Plan of care: #1 patient was evaluated  #2 debridement of open wound was performed to the periungual border of the respective toe using a currette. Antibiotic ointment and Band-Aid was applied. #3 Continue using Gentamicin cream to the lateral border left hallux.  #4 Prescription for Doxycycline 100 mg #20 provided to patient.  #5 Compression anklets dispensed.  #6 Discontinue using Meloxicam. Prescription for Diclofenac  provided to patient.  #7 patient is to return to clinic in 2 weeks for ingrown nail of the right toe.  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    Edrick Kins, DPM [facility]  Dr. Edrick Kins, [address]                                        [place], [place] [address]                Office [phone]  Fax [phone]

## 2019-01-30 ENCOUNTER — Ambulatory Visit (INDEPENDENT_AMBULATORY_CARE_PROVIDER_SITE_OTHER): Payer: Medicare Other | Admitting: Podiatry

## 2019-01-30 ENCOUNTER — Other Ambulatory Visit: Payer: Self-pay

## 2019-01-30 DIAGNOSIS — M659 Synovitis and tenosynovitis, unspecified: Secondary | ICD-10-CM

## 2019-01-30 DIAGNOSIS — L600 Ingrowing nail: Secondary | ICD-10-CM

## 2019-01-30 DIAGNOSIS — E119 Type 2 diabetes mellitus without complications: Secondary | ICD-10-CM | POA: Diagnosis not present

## 2019-01-30 DIAGNOSIS — R7989 Other specified abnormal findings of blood chemistry: Secondary | ICD-10-CM | POA: Diagnosis not present

## 2019-01-30 DIAGNOSIS — E538 Deficiency of other specified B group vitamins: Secondary | ICD-10-CM | POA: Diagnosis not present

## 2019-01-30 NOTE — Patient Instructions (Signed)

## 2019-02-01 ENCOUNTER — Ambulatory Visit: Payer: Medicare Other | Admitting: Podiatry

## 2019-02-01 DIAGNOSIS — R350 Frequency of micturition: Secondary | ICD-10-CM | POA: Diagnosis not present

## 2019-02-02 NOTE — Progress Notes (Signed)
   Subjective: Patient presents today for evaluation of pain to the medial and lateral borders of the right great toe that began several weeks ago. Patient is concerned for possible ingrown nail. Touching the area and applying pressure increases the pain. He is currently taking a course of Doxycycline which temporarily helps resolve the symptoms. Patient presents today for further treatment and evaluation.  Past Medical History:  Diagnosis Date  . Anemia    B 12 DEFICIENCY  . Arthritis   . Back pain   . Diabetes mellitus without complication ([place])   . Hypertension   . Prostate enlargement   . Sleep apnea    USES C-PAP    Objective:  General: Well developed, nourished, in no acute distress, alert and oriented x3   Dermatology: Skin is warm, dry and supple bilateral. Medial and lateral borders right great toe appears to be erythematous with evidence of an ingrowing nail. Pain on palpation noted to the border of the nail fold. The remaining nails appear unremarkable at this time. There are no open sores, lesions.  Vascular: Dorsalis Pedis artery and Posterior Tibial artery pedal pulses palpable. No lower extremity edema noted.   Neruologic: Grossly intact via light touch bilateral.  Musculoskeletal: Pain with palpation noted to the medial, anterior and lateral aspects of the bilateral ankle joints. Muscular strength within normal limits in all groups bilateral. Normal range of motion noted to all pedal and ankle joints.   Assesement: #1 Paronychia with ingrowing nail medial and lateral border right great toe #2 Pain in toe #3 Incurvated nail #4 Ingrown follow up medial and lateral borders left hallux - resolved  #5 Ankle synovitis bilateral   Plan of Care:  1. Patient evaluated.  2. Discussed treatment alternatives and plan of care. Explained nail avulsion procedure and post procedure course to patient. 3. Patient opted for permanent partial nail avulsion of the medial and lateral  borders of the right great toe.  4. Prior to procedure, local anesthesia infiltration utilized using 3 ml of a 50:50 mixture of 2% plain lidocaine and 0.5% plain marcaine in a normal hallux block fashion and a betadine prep performed.  5. Partial permanent nail avulsion with chemical matrixectomy performed using [id number] applications of phenol followed by alcohol flush.  6. Light dressing applied. 7. Continue using Gentamicin cream as directed.  8. Continue taking oral Doxycycline as directed.  9. Continue taking Diclofenac as needed.  10. Custom orthotics dispensed today with instructions provided.  11. Return to clinic in 2 weeks.   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    Edrick Kins, DPM [facility]  Dr. Edrick Kins, [address]                                        [place], [place] [address]                Office [phone]  Fax [phone]

## 2019-02-07 ENCOUNTER — Other Ambulatory Visit: Payer: Medicare Other | Admitting: Orthotics

## 2019-02-07 DIAGNOSIS — G4733 Obstructive sleep apnea (adult) (pediatric): Secondary | ICD-10-CM | POA: Diagnosis not present

## 2019-02-07 DIAGNOSIS — N401 Enlarged prostate with lower urinary tract symptoms: Secondary | ICD-10-CM | POA: Diagnosis not present

## 2019-02-07 DIAGNOSIS — Z23 Encounter for immunization: Secondary | ICD-10-CM | POA: Diagnosis not present

## 2019-02-07 DIAGNOSIS — N182 Chronic kidney disease, stage 2 (mild): Secondary | ICD-10-CM | POA: Diagnosis not present

## 2019-02-07 DIAGNOSIS — M1990 Unspecified osteoarthritis, unspecified site: Secondary | ICD-10-CM | POA: Diagnosis not present

## 2019-02-07 DIAGNOSIS — D696 Thrombocytopenia, unspecified: Secondary | ICD-10-CM | POA: Diagnosis not present

## 2019-02-07 DIAGNOSIS — N400 Enlarged prostate without lower urinary tract symptoms: Secondary | ICD-10-CM | POA: Diagnosis not present

## 2019-02-07 DIAGNOSIS — I10 Essential (primary) hypertension: Secondary | ICD-10-CM | POA: Diagnosis not present

## 2019-02-07 DIAGNOSIS — E7800 Pure hypercholesterolemia, unspecified: Secondary | ICD-10-CM | POA: Diagnosis not present

## 2019-02-07 DIAGNOSIS — E1169 Type 2 diabetes mellitus with other specified complication: Secondary | ICD-10-CM | POA: Diagnosis not present

## 2019-02-07 DIAGNOSIS — K7581 Nonalcoholic steatohepatitis (NASH): Secondary | ICD-10-CM | POA: Diagnosis not present

## 2019-02-07 DIAGNOSIS — E119 Type 2 diabetes mellitus without complications: Secondary | ICD-10-CM | POA: Diagnosis not present

## 2019-02-07 DIAGNOSIS — I700 Atherosclerosis of aorta: Secondary | ICD-10-CM | POA: Diagnosis not present

## 2019-02-13 ENCOUNTER — Ambulatory Visit: Payer: Medicare Other | Admitting: Podiatry

## 2019-02-14 DIAGNOSIS — G4733 Obstructive sleep apnea (adult) (pediatric): Secondary | ICD-10-CM | POA: Diagnosis not present

## 2019-02-15 ENCOUNTER — Other Ambulatory Visit: Payer: Self-pay

## 2019-02-15 ENCOUNTER — Ambulatory Visit (INDEPENDENT_AMBULATORY_CARE_PROVIDER_SITE_OTHER): Payer: Medicare Other | Admitting: Podiatry

## 2019-02-15 DIAGNOSIS — L600 Ingrowing nail: Secondary | ICD-10-CM

## 2019-02-19 NOTE — Progress Notes (Signed)
   Subjective: 73 y.o. male presents today status post permanent nail avulsion procedure of the medial and lateral borders of the right hallux that was performed on 01/30/2019. He states he is doing well. He reports some mild erythema of the toe and expresses concern for infection. He denies any modifying factors. He has finished his course of Doxycycline and has been applying Gentamicin cream as directed. Patient is here for further evaluation and treatment.   Past Medical History:  Diagnosis Date  . Anemia    B 12 DEFICIENCY  . Arthritis   . Back pain   . Diabetes mellitus without complication ([place])   . Hypertension   . Prostate enlargement   . Sleep apnea    USES C-PAP    Objective: Skin is warm, dry and supple. Nail and respective nail fold appears to be healing appropriately. Open wound to the associated nail fold with a granular wound base and moderate amount of fibrotic tissue. Minimal drainage noted. Mild erythema around the periungual region likely due to phenol chemical matricectomy.  Assessment: #1 postop permanent partial nail avulsion medial and lateral borders right hallux  #2 open wound periungual nail fold of respective digit.   Plan of care: #1 patient was evaluated  #2 debridement of open wound was performed to the periungual border of the respective toe using a currette. Antibiotic ointment and Band-Aid was applied. #3 patient is to return to clinic on a PRN basis.   Edrick Kins, DPM [facility]  Dr. Edrick Kins, [address]                                        [place], [place] [address]                Office [phone]  Fax [phone]

## 2019-04-26 ENCOUNTER — Other Ambulatory Visit: Payer: Self-pay

## 2019-04-26 ENCOUNTER — Ambulatory Visit (INDEPENDENT_AMBULATORY_CARE_PROVIDER_SITE_OTHER): Payer: Medicare Other | Admitting: Podiatry

## 2019-04-26 DIAGNOSIS — M659 Synovitis and tenosynovitis, unspecified: Secondary | ICD-10-CM | POA: Diagnosis not present

## 2019-04-26 DIAGNOSIS — L600 Ingrowing nail: Secondary | ICD-10-CM

## 2019-04-26 MED ORDER — GENTAMICIN SULFATE 0.1 % EX CREA: 1.0000 "application " | TOPICAL_CREAM | Freq: Two times a day (BID) | CUTANEOUS | 1 refills | Status: DC

## 2019-04-26 MED ORDER — DOXYCYCLINE HYCLATE 100 MG PO TABS: 100.0000 mg | ORAL_TABLET | Freq: Two times a day (BID) | ORAL | 0 refills | Status: DC

## 2019-04-26 NOTE — Patient Instructions (Signed)

## 2019-05-01 NOTE — Progress Notes (Signed)
   Subjective: Patient presents today for evaluation of pain to the lateral border of the right hallux that began last week. He reports associated redness and swelling of the area. Patient is concerned for possible ingrown nail. Wearing shoes and applying pressure to the toe increases the pain. He had a permanent partial nail avulsion procedure of the lateral border of the right great toe performed on 01/30/2019. Patient presents today for further treatment and evaluation.  Past Medical History:  Diagnosis Date  . Anemia    B 12 DEFICIENCY  . Arthritis   . Back pain   . Diabetes mellitus without complication ([place])   . Hypertension   . Prostate enlargement   . Sleep apnea    USES C-PAP    Objective:  General: Well developed, nourished, in no acute distress, alert and oriented x3   Dermatology: Skin is warm, dry and supple bilateral. Lateral border of the right great toe appears to be erythematous with evidence of an ingrowing nail. Pain on palpation noted to the border of the nail fold. The remaining nails appear unremarkable at this time. There are no open sores, lesions.  Vascular: Dorsalis Pedis artery and Posterior Tibial artery pedal pulses palpable. No lower extremity edema noted.   Neruologic: Grossly intact via light touch bilateral.  Musculoskeletal: Pain with palpation noted to the anterior, medial and lateral aspects of the bilateral ankle joints. Muscular strength within normal limits in all groups bilateral. Normal range of motion noted to all pedal and ankle joints.   Assesement: #1 Paronychia with ingrowing nail lateral border right great toe #2 Pain in toe #3 Incurvated nail #4 Ankle synovitis bilateral   Plan of Care:  1. Patient evaluated.  2. Discussed treatment alternatives and plan of care. Explained nail avulsion procedure and post procedure course to patient. 3. Patient opted for permanent partial nail avulsion of the lateral border right great toe.  4.  Prior to procedure, local anesthesia infiltration utilized using 3 ml of a 50:50 mixture of 2% plain lidocaine and 0.5% plain marcaine in a normal hallux block fashion and a betadine prep performed.  5. Partial permanent nail avulsion with chemical matrixectomy performed using [id number] applications of phenol followed by alcohol flush.  6. Light dressing applied. 7. Injection of 0.5 mLs Celestone Soluspan injected into the bilateral ankle joints.  8. Prescription for Doxycycline 100 mg provided to patient.  9. Prescription for Gentamicin cream provided to patient to use daily with a bandage.  10. Compression anklets dispensed.  11. Return to clinic in 2 weeks.  Edrick Kins, DPM [facility]  Dr. Edrick Kins, [address]                                        [place], [place] [address]                Office [phone]  Fax [phone]

## 2019-05-02 ENCOUNTER — Ambulatory Visit: Payer: Medicare Other | Admitting: Orthotics

## 2019-05-02 ENCOUNTER — Other Ambulatory Visit: Payer: Self-pay

## 2019-05-02 DIAGNOSIS — M659 Synovitis and tenosynovitis, unspecified: Secondary | ICD-10-CM

## 2019-05-02 NOTE — Progress Notes (Signed)
Made adjustment by adding valgus RF/FF wedge and lateral cushioning.

## 2019-05-10 ENCOUNTER — Ambulatory Visit (INDEPENDENT_AMBULATORY_CARE_PROVIDER_SITE_OTHER): Payer: Medicare Other | Admitting: Podiatry

## 2019-05-10 ENCOUNTER — Other Ambulatory Visit: Payer: Self-pay

## 2019-05-10 DIAGNOSIS — E119 Type 2 diabetes mellitus without complications: Secondary | ICD-10-CM

## 2019-05-10 DIAGNOSIS — Z96651 Presence of right artificial knee joint: Secondary | ICD-10-CM | POA: Diagnosis not present

## 2019-05-10 DIAGNOSIS — M25562 Pain in left knee: Secondary | ICD-10-CM | POA: Diagnosis not present

## 2019-05-10 DIAGNOSIS — L600 Ingrowing nail: Secondary | ICD-10-CM

## 2019-05-10 DIAGNOSIS — M659 Synovitis and tenosynovitis, unspecified: Secondary | ICD-10-CM

## 2019-05-10 DIAGNOSIS — Z96659 Presence of unspecified artificial knee joint: Secondary | ICD-10-CM | POA: Insufficient documentation

## 2019-05-10 DIAGNOSIS — Z96652 Presence of left artificial knee joint: Secondary | ICD-10-CM | POA: Insufficient documentation

## 2019-05-10 DIAGNOSIS — M25561 Pain in right knee: Secondary | ICD-10-CM | POA: Diagnosis not present

## 2019-05-10 DIAGNOSIS — M65072 Abscess of tendon sheath, left ankle and foot: Secondary | ICD-10-CM

## 2019-05-10 MED ORDER — MELOXICAM 15 MG PO TABS: 15.0000 mg | ORAL_TABLET | Freq: Every day | ORAL | 1 refills | Status: DC

## 2019-05-11 ENCOUNTER — Other Ambulatory Visit: Payer: Self-pay

## 2019-05-11 ENCOUNTER — Ambulatory Visit: Payer: Medicare Other | Admitting: Orthotics

## 2019-05-11 DIAGNOSIS — E119 Type 2 diabetes mellitus without complications: Secondary | ICD-10-CM

## 2019-05-11 DIAGNOSIS — M659 Synovitis and tenosynovitis, unspecified: Secondary | ICD-10-CM

## 2019-05-11 NOTE — Progress Notes (Signed)
Added scaphoid pad and took added valgus RF wedge

## 2019-05-14 NOTE — Progress Notes (Signed)
   Subjective: 74 y.o. male presents today status post permanent nail avulsion procedure of the lateral border of the right great toe that was performed on 04/26/2019. She is also here for follow up evaluation of left ankle pain. She reports finishing the Doxycycline and Gentamicin as directed for the ingrown nail. She denies any significant pain or modifying factors. She reports using the compression anklets for the ankle pain which help relieve the symptoms. There are no worsening factors noted for this complaint. Patient is here for further evaluation and treatment.    Past Medical History:  Diagnosis Date  . Anemia    B 12 DEFICIENCY  . Arthritis   . Back pain   . Diabetes mellitus without complication ([place])   . Hypertension   . Prostate enlargement   . Sleep apnea    USES C-PAP    Objective: Skin is warm, dry and supple. Nail and respective nail fold appears to be healing appropriately. Open wound to the associated nail fold with a granular wound base and moderate amount of fibrotic tissue. Minimal drainage noted. Mild erythema around the periungual region likely due to phenol chemical matricectomy. Pain with palpation noted to the anterior, medial and lateral aspects of the left ankle joint.   Assessment: #1 postop permanent partial nail avulsion lateral border right great toe #2 open wound periungual nail fold of respective digit.  #3 ankle synovitis left   Plan of care: #1 patient was evaluated  #2 debridement of open wound was performed to the periungual border of the respective toe using a currette. Antibiotic ointment and Band-Aid was applied. #3 Injection of 0.5 mLs Celestone Soluspan injected into the left ankle joint.  #4 Refill prescription for Meloxicam provided to patient.  #5 patient is to return to clinic on a PRN basis.   Edrick Kins, DPM [facility]  Dr. Edrick Kins, [address]                                          [place], [place] [address]                Office [phone]  Fax [phone]

## 2019-05-22 DIAGNOSIS — N390 Urinary tract infection, site not specified: Secondary | ICD-10-CM | POA: Diagnosis not present

## 2019-05-30 ENCOUNTER — Other Ambulatory Visit: Payer: Medicare Other | Admitting: Orthotics

## 2019-05-30 DIAGNOSIS — E538 Deficiency of other specified B group vitamins: Secondary | ICD-10-CM | POA: Diagnosis not present

## 2019-05-30 DIAGNOSIS — R7989 Other specified abnormal findings of blood chemistry: Secondary | ICD-10-CM | POA: Diagnosis not present

## 2019-06-05 DIAGNOSIS — H04123 Dry eye syndrome of bilateral lacrimal glands: Secondary | ICD-10-CM | POA: Diagnosis not present

## 2019-06-06 DIAGNOSIS — J019 Acute sinusitis, unspecified: Secondary | ICD-10-CM | POA: Diagnosis not present

## 2019-06-06 DIAGNOSIS — J309 Allergic rhinitis, unspecified: Secondary | ICD-10-CM | POA: Diagnosis not present

## 2019-06-13 DIAGNOSIS — N400 Enlarged prostate without lower urinary tract symptoms: Secondary | ICD-10-CM | POA: Diagnosis not present

## 2019-06-13 DIAGNOSIS — M1990 Unspecified osteoarthritis, unspecified site: Secondary | ICD-10-CM | POA: Diagnosis not present

## 2019-06-13 DIAGNOSIS — I10 Essential (primary) hypertension: Secondary | ICD-10-CM | POA: Diagnosis not present

## 2019-06-13 DIAGNOSIS — N401 Enlarged prostate with lower urinary tract symptoms: Secondary | ICD-10-CM | POA: Diagnosis not present

## 2019-06-13 DIAGNOSIS — N182 Chronic kidney disease, stage 2 (mild): Secondary | ICD-10-CM | POA: Diagnosis not present

## 2019-06-13 DIAGNOSIS — E7800 Pure hypercholesterolemia, unspecified: Secondary | ICD-10-CM | POA: Diagnosis not present

## 2019-06-13 DIAGNOSIS — E1169 Type 2 diabetes mellitus with other specified complication: Secondary | ICD-10-CM | POA: Diagnosis not present

## 2019-06-13 DIAGNOSIS — E119 Type 2 diabetes mellitus without complications: Secondary | ICD-10-CM | POA: Diagnosis not present

## 2019-06-14 DIAGNOSIS — M47816 Spondylosis without myelopathy or radiculopathy, lumbar region: Secondary | ICD-10-CM | POA: Diagnosis not present

## 2019-06-15 DIAGNOSIS — N182 Chronic kidney disease, stage 2 (mild): Secondary | ICD-10-CM | POA: Diagnosis not present

## 2019-06-15 DIAGNOSIS — E1169 Type 2 diabetes mellitus with other specified complication: Secondary | ICD-10-CM | POA: Diagnosis not present

## 2019-06-15 DIAGNOSIS — I10 Essential (primary) hypertension: Secondary | ICD-10-CM | POA: Diagnosis not present

## 2019-06-15 DIAGNOSIS — I700 Atherosclerosis of aorta: Secondary | ICD-10-CM | POA: Diagnosis not present

## 2019-06-15 DIAGNOSIS — M109 Gout, unspecified: Secondary | ICD-10-CM | POA: Diagnosis not present

## 2019-06-15 DIAGNOSIS — E291 Testicular hypofunction: Secondary | ICD-10-CM | POA: Diagnosis not present

## 2019-06-15 DIAGNOSIS — K7581 Nonalcoholic steatohepatitis (NASH): Secondary | ICD-10-CM | POA: Diagnosis not present

## 2019-06-15 DIAGNOSIS — J309 Allergic rhinitis, unspecified: Secondary | ICD-10-CM | POA: Diagnosis not present

## 2019-06-15 DIAGNOSIS — G4733 Obstructive sleep apnea (adult) (pediatric): Secondary | ICD-10-CM | POA: Diagnosis not present

## 2019-06-15 DIAGNOSIS — E7800 Pure hypercholesterolemia, unspecified: Secondary | ICD-10-CM | POA: Diagnosis not present

## 2019-06-15 DIAGNOSIS — D696 Thrombocytopenia, unspecified: Secondary | ICD-10-CM | POA: Diagnosis not present

## 2019-06-19 ENCOUNTER — Other Ambulatory Visit: Payer: Medicare Other | Admitting: Orthotics

## 2019-06-26 ENCOUNTER — Other Ambulatory Visit: Payer: Self-pay

## 2019-06-26 ENCOUNTER — Ambulatory Visit (INDEPENDENT_AMBULATORY_CARE_PROVIDER_SITE_OTHER): Payer: Medicare Other | Admitting: Podiatry

## 2019-06-26 DIAGNOSIS — R7989 Other specified abnormal findings of blood chemistry: Secondary | ICD-10-CM | POA: Diagnosis not present

## 2019-06-26 DIAGNOSIS — M659 Synovitis and tenosynovitis, unspecified: Secondary | ICD-10-CM | POA: Diagnosis not present

## 2019-06-26 DIAGNOSIS — E538 Deficiency of other specified B group vitamins: Secondary | ICD-10-CM | POA: Diagnosis not present

## 2019-06-29 NOTE — Progress Notes (Signed)
   Subjective:  74 y.o. male presenting today for follow up evaluation of bilateral ankle pain. He reports a continued constant aching pain of the ankles. Walking and standing increases the pain. He has taken Meloxicam for treatment in the past. Patient is here for further evaluation and treatment.   Past Medical History:  Diagnosis Date  . Anemia    B 12 DEFICIENCY  . Arthritis   . Back pain   . Diabetes mellitus without complication ([place])   . Hypertension   . Prostate enlargement   . Sleep apnea    USES C-PAP     Objective / Physical Exam:  General:  The patient is alert and oriented x3 in no acute distress. Dermatology:  Skin is warm, dry and supple bilateral lower extremities. Negative for open lesions or macerations. Vascular:  Palpable pedal pulses bilaterally. No edema or erythema noted. Capillary refill within normal limits. Neurological:  Epicritic and protective threshold grossly intact bilaterally.  Musculoskeletal Exam:  Pain on palpation to the anterior lateral medial aspects of the patient's bilateral ankles. Mild edema noted. Range of motion within normal limits to all pedal and ankle joints bilateral. Muscle strength 5/5 in all groups bilateral.   Assessment: 1. Ankle synovitis bilateral   Plan of Care:  1. Patient was evaluated.  2. Injection of 0.5 mL Celestone Soluspan injected in the patient's bilateral ankle joints.  3. Recommended good shoe gear.  4. Compression anklets dispensed.  5. Return to clinic as needed.    Edrick Kins, DPM [facility]  Dr. Edrick Kins, [address]                                        [place], [place] [address]                Office [phone]  Fax [phone]

## 2019-07-10 ENCOUNTER — Ambulatory Visit: Payer: Medicare Other | Admitting: Podiatry

## 2019-07-18 DIAGNOSIS — N401 Enlarged prostate with lower urinary tract symptoms: Secondary | ICD-10-CM | POA: Diagnosis not present

## 2019-07-18 DIAGNOSIS — E1169 Type 2 diabetes mellitus with other specified complication: Secondary | ICD-10-CM | POA: Diagnosis not present

## 2019-07-18 DIAGNOSIS — E7800 Pure hypercholesterolemia, unspecified: Secondary | ICD-10-CM | POA: Diagnosis not present

## 2019-07-18 DIAGNOSIS — M1990 Unspecified osteoarthritis, unspecified site: Secondary | ICD-10-CM | POA: Diagnosis not present

## 2019-07-18 DIAGNOSIS — I10 Essential (primary) hypertension: Secondary | ICD-10-CM | POA: Diagnosis not present

## 2019-07-18 DIAGNOSIS — E119 Type 2 diabetes mellitus without complications: Secondary | ICD-10-CM | POA: Diagnosis not present

## 2019-07-18 DIAGNOSIS — N182 Chronic kidney disease, stage 2 (mild): Secondary | ICD-10-CM | POA: Diagnosis not present

## 2019-07-18 DIAGNOSIS — N400 Enlarged prostate without lower urinary tract symptoms: Secondary | ICD-10-CM | POA: Diagnosis not present

## 2019-07-31 DIAGNOSIS — E538 Deficiency of other specified B group vitamins: Secondary | ICD-10-CM | POA: Diagnosis not present

## 2019-07-31 DIAGNOSIS — E291 Testicular hypofunction: Secondary | ICD-10-CM | POA: Diagnosis not present

## 2019-08-14 DIAGNOSIS — J309 Allergic rhinitis, unspecified: Secondary | ICD-10-CM | POA: Diagnosis not present

## 2019-08-18 DIAGNOSIS — N401 Enlarged prostate with lower urinary tract symptoms: Secondary | ICD-10-CM | POA: Diagnosis not present

## 2019-08-18 DIAGNOSIS — M1990 Unspecified osteoarthritis, unspecified site: Secondary | ICD-10-CM | POA: Diagnosis not present

## 2019-08-18 DIAGNOSIS — E1169 Type 2 diabetes mellitus with other specified complication: Secondary | ICD-10-CM | POA: Diagnosis not present

## 2019-08-18 DIAGNOSIS — N182 Chronic kidney disease, stage 2 (mild): Secondary | ICD-10-CM | POA: Diagnosis not present

## 2019-08-18 DIAGNOSIS — N400 Enlarged prostate without lower urinary tract symptoms: Secondary | ICD-10-CM | POA: Diagnosis not present

## 2019-08-18 DIAGNOSIS — E119 Type 2 diabetes mellitus without complications: Secondary | ICD-10-CM | POA: Diagnosis not present

## 2019-08-18 DIAGNOSIS — I10 Essential (primary) hypertension: Secondary | ICD-10-CM | POA: Diagnosis not present

## 2019-08-18 DIAGNOSIS — E7800 Pure hypercholesterolemia, unspecified: Secondary | ICD-10-CM | POA: Diagnosis not present

## 2019-08-30 DIAGNOSIS — E291 Testicular hypofunction: Secondary | ICD-10-CM | POA: Diagnosis not present

## 2019-08-30 DIAGNOSIS — E538 Deficiency of other specified B group vitamins: Secondary | ICD-10-CM | POA: Diagnosis not present

## 2019-09-05 DIAGNOSIS — M461 Sacroiliitis, not elsewhere classified: Secondary | ICD-10-CM | POA: Diagnosis not present

## 2019-09-12 DIAGNOSIS — R350 Frequency of micturition: Secondary | ICD-10-CM | POA: Diagnosis not present

## 2019-09-12 DIAGNOSIS — R8290 Unspecified abnormal findings in urine: Secondary | ICD-10-CM | POA: Diagnosis not present

## 2019-10-18 DIAGNOSIS — Z1389 Encounter for screening for other disorder: Secondary | ICD-10-CM | POA: Diagnosis not present

## 2019-10-18 DIAGNOSIS — Z0000 Encounter for general adult medical examination without abnormal findings: Secondary | ICD-10-CM | POA: Diagnosis not present

## 2019-10-31 DIAGNOSIS — I10 Essential (primary) hypertension: Secondary | ICD-10-CM | POA: Diagnosis not present

## 2019-10-31 DIAGNOSIS — N401 Enlarged prostate with lower urinary tract symptoms: Secondary | ICD-10-CM | POA: Diagnosis not present

## 2019-10-31 DIAGNOSIS — N400 Enlarged prostate without lower urinary tract symptoms: Secondary | ICD-10-CM | POA: Diagnosis not present

## 2019-10-31 DIAGNOSIS — N182 Chronic kidney disease, stage 2 (mild): Secondary | ICD-10-CM | POA: Diagnosis not present

## 2019-10-31 DIAGNOSIS — M1990 Unspecified osteoarthritis, unspecified site: Secondary | ICD-10-CM | POA: Diagnosis not present

## 2019-10-31 DIAGNOSIS — E7800 Pure hypercholesterolemia, unspecified: Secondary | ICD-10-CM | POA: Diagnosis not present

## 2019-10-31 DIAGNOSIS — E119 Type 2 diabetes mellitus without complications: Secondary | ICD-10-CM | POA: Diagnosis not present

## 2019-10-31 DIAGNOSIS — E1169 Type 2 diabetes mellitus with other specified complication: Secondary | ICD-10-CM | POA: Diagnosis not present

## 2019-11-07 DIAGNOSIS — E538 Deficiency of other specified B group vitamins: Secondary | ICD-10-CM | POA: Diagnosis not present

## 2019-11-07 DIAGNOSIS — R7989 Other specified abnormal findings of blood chemistry: Secondary | ICD-10-CM | POA: Diagnosis not present

## 2019-11-14 DIAGNOSIS — Z23 Encounter for immunization: Secondary | ICD-10-CM | POA: Diagnosis not present

## 2019-11-20 ENCOUNTER — Encounter: Payer: Self-pay | Admitting: Podiatry

## 2019-11-20 ENCOUNTER — Ambulatory Visit (INDEPENDENT_AMBULATORY_CARE_PROVIDER_SITE_OTHER): Payer: Medicare Other | Admitting: Podiatry

## 2019-11-20 ENCOUNTER — Other Ambulatory Visit: Payer: Self-pay

## 2019-11-20 DIAGNOSIS — M79675 Pain in left toe(s): Secondary | ICD-10-CM | POA: Diagnosis not present

## 2019-11-20 DIAGNOSIS — M19072 Primary osteoarthritis, left ankle and foot: Secondary | ICD-10-CM

## 2019-11-20 DIAGNOSIS — B351 Tinea unguium: Secondary | ICD-10-CM

## 2019-11-20 DIAGNOSIS — M65971 Unspecified synovitis and tenosynovitis, right ankle and foot: Secondary | ICD-10-CM

## 2019-11-20 DIAGNOSIS — M65972 Unspecified synovitis and tenosynovitis, left ankle and foot: Secondary | ICD-10-CM

## 2019-11-20 DIAGNOSIS — M79674 Pain in right toe(s): Secondary | ICD-10-CM

## 2019-11-20 DIAGNOSIS — M659 Synovitis and tenosynovitis, unspecified: Secondary | ICD-10-CM | POA: Diagnosis not present

## 2019-11-20 DIAGNOSIS — M19071 Primary osteoarthritis, right ankle and foot: Secondary | ICD-10-CM

## 2019-11-20 MED ORDER — CELECOXIB 100 MG PO CAPS: 100.0000 mg | ORAL_CAPSULE | Freq: Two times a day (BID) | ORAL | 1 refills | Status: DC

## 2019-11-20 NOTE — Progress Notes (Signed)
° °  Subjective:  74 y.o. male presenting today for follow up evaluation of bilateral ankle pain.  He states that he gets some temporary relief with the ankle joint injections.  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 Patient also has pain associated to the bilateral toes.  He states they are occasionally painful.  He complains of elongated nails that are symptomatic.  He is unable to trim his own nails.  He presents for further treatment evaluation   Past Medical History:  Diagnosis Date   Anemia    B 12 DEFICIENCY   Arthritis    Back pain    Diabetes mellitus without complication ([facility])    Hypertension    Prostate enlargement    Sleep apnea    USES C-PAP     Objective / Physical Exam:  General:  The patient is alert and oriented x3 in no acute distress. Dermatology:  Skin is warm, dry and supple bilateral lower extremities. Negative for open lesions or macerations.  Elongated hyperkeratotic nails noted 1-5 bilateral.  He does have history of partial permanent nail avulsions to the medial lateral borders of the bilateral great toes. Vascular:  Palpable pedal pulses bilaterally. No edema or erythema noted. Capillary refill within normal limits. Neurological:  Epicritic and protective threshold grossly intact bilaterally.  Musculoskeletal Exam:  Pain on palpation to the anterior lateral medial aspects of the patient's bilateral ankles. Mild edema noted. Range of motion within normal limits to all pedal and ankle joints bilateral. Muscle strength 5/5 in all groups bilateral.   Assessment: 1. Ankle synovitis bilateral  2.  Pain due to onychomycosis/elongated nails 1-5 bilateral  Plan of Care:  1. Patient was evaluated.  2. Injection of 0.5 mL Celestone Soluspan injected in the patient's bilateral ankle joints.  3. Recommended good shoe gear.  4.  Continue compression anklet's 5.  Mechanical debridement of nails 1-5 bilateral was performed using a  nail nipper without incident or bleeding  6.  Return to clinic in 3 months   Edrick Kins, DPM [facility]  Dr. Edrick Kins, [address]                                        [place], [place] [address]                Office [phone]  Fax [phone]

## 2019-11-23 DIAGNOSIS — N401 Enlarged prostate with lower urinary tract symptoms: Secondary | ICD-10-CM | POA: Diagnosis not present

## 2019-11-23 DIAGNOSIS — M1990 Unspecified osteoarthritis, unspecified site: Secondary | ICD-10-CM | POA: Diagnosis not present

## 2019-11-23 DIAGNOSIS — E1169 Type 2 diabetes mellitus with other specified complication: Secondary | ICD-10-CM | POA: Diagnosis not present

## 2019-11-23 DIAGNOSIS — E7800 Pure hypercholesterolemia, unspecified: Secondary | ICD-10-CM | POA: Diagnosis not present

## 2019-11-23 DIAGNOSIS — N182 Chronic kidney disease, stage 2 (mild): Secondary | ICD-10-CM | POA: Diagnosis not present

## 2019-11-23 DIAGNOSIS — E119 Type 2 diabetes mellitus without complications: Secondary | ICD-10-CM | POA: Diagnosis not present

## 2019-11-23 DIAGNOSIS — N400 Enlarged prostate without lower urinary tract symptoms: Secondary | ICD-10-CM | POA: Diagnosis not present

## 2019-11-23 DIAGNOSIS — I10 Essential (primary) hypertension: Secondary | ICD-10-CM | POA: Diagnosis not present

## 2019-11-27 DIAGNOSIS — Z6841 Body Mass Index (BMI) 40.0 and over, adult: Secondary | ICD-10-CM | POA: Insufficient documentation

## 2019-11-27 DIAGNOSIS — M461 Sacroiliitis, not elsewhere classified: Secondary | ICD-10-CM | POA: Insufficient documentation

## 2019-11-27 DIAGNOSIS — M48061 Spinal stenosis, lumbar region without neurogenic claudication: Secondary | ICD-10-CM | POA: Diagnosis not present

## 2019-11-27 DIAGNOSIS — M47816 Spondylosis without myelopathy or radiculopathy, lumbar region: Secondary | ICD-10-CM | POA: Diagnosis not present

## 2019-12-05 DIAGNOSIS — Z23 Encounter for immunization: Secondary | ICD-10-CM | POA: Diagnosis not present

## 2019-12-12 DIAGNOSIS — E291 Testicular hypofunction: Secondary | ICD-10-CM | POA: Diagnosis not present

## 2019-12-12 DIAGNOSIS — E538 Deficiency of other specified B group vitamins: Secondary | ICD-10-CM | POA: Diagnosis not present

## 2020-01-02 DIAGNOSIS — E7800 Pure hypercholesterolemia, unspecified: Secondary | ICD-10-CM | POA: Diagnosis not present

## 2020-01-02 DIAGNOSIS — M1990 Unspecified osteoarthritis, unspecified site: Secondary | ICD-10-CM | POA: Diagnosis not present

## 2020-01-02 DIAGNOSIS — I10 Essential (primary) hypertension: Secondary | ICD-10-CM | POA: Diagnosis not present

## 2020-01-02 DIAGNOSIS — N400 Enlarged prostate without lower urinary tract symptoms: Secondary | ICD-10-CM | POA: Diagnosis not present

## 2020-01-02 DIAGNOSIS — N401 Enlarged prostate with lower urinary tract symptoms: Secondary | ICD-10-CM | POA: Diagnosis not present

## 2020-01-02 DIAGNOSIS — E1169 Type 2 diabetes mellitus with other specified complication: Secondary | ICD-10-CM | POA: Diagnosis not present

## 2020-01-02 DIAGNOSIS — N182 Chronic kidney disease, stage 2 (mild): Secondary | ICD-10-CM | POA: Diagnosis not present

## 2020-01-02 DIAGNOSIS — E119 Type 2 diabetes mellitus without complications: Secondary | ICD-10-CM | POA: Diagnosis not present

## 2020-01-03 DIAGNOSIS — I10 Essential (primary) hypertension: Secondary | ICD-10-CM | POA: Diagnosis not present

## 2020-01-03 DIAGNOSIS — M25551 Pain in right hip: Secondary | ICD-10-CM | POA: Diagnosis not present

## 2020-01-03 DIAGNOSIS — Z6841 Body Mass Index (BMI) 40.0 and over, adult: Secondary | ICD-10-CM | POA: Diagnosis not present

## 2020-01-03 DIAGNOSIS — M461 Sacroiliitis, not elsewhere classified: Secondary | ICD-10-CM | POA: Diagnosis not present

## 2020-01-03 DIAGNOSIS — M25559 Pain in unspecified hip: Secondary | ICD-10-CM | POA: Insufficient documentation

## 2020-01-15 DIAGNOSIS — N390 Urinary tract infection, site not specified: Secondary | ICD-10-CM | POA: Diagnosis not present

## 2020-01-16 DIAGNOSIS — N390 Urinary tract infection, site not specified: Secondary | ICD-10-CM | POA: Diagnosis not present

## 2020-01-16 DIAGNOSIS — R7989 Other specified abnormal findings of blood chemistry: Secondary | ICD-10-CM | POA: Diagnosis not present

## 2020-01-16 DIAGNOSIS — E538 Deficiency of other specified B group vitamins: Secondary | ICD-10-CM | POA: Diagnosis not present

## 2020-02-13 DIAGNOSIS — G4733 Obstructive sleep apnea (adult) (pediatric): Secondary | ICD-10-CM | POA: Diagnosis not present

## 2020-02-20 DIAGNOSIS — E538 Deficiency of other specified B group vitamins: Secondary | ICD-10-CM | POA: Diagnosis not present

## 2020-02-20 DIAGNOSIS — E291 Testicular hypofunction: Secondary | ICD-10-CM | POA: Diagnosis not present

## 2020-02-27 DIAGNOSIS — Z23 Encounter for immunization: Secondary | ICD-10-CM | POA: Diagnosis not present

## 2020-02-27 DIAGNOSIS — K760 Fatty (change of) liver, not elsewhere classified: Secondary | ICD-10-CM | POA: Diagnosis not present

## 2020-02-27 DIAGNOSIS — G4733 Obstructive sleep apnea (adult) (pediatric): Secondary | ICD-10-CM | POA: Diagnosis not present

## 2020-02-27 DIAGNOSIS — E1140 Type 2 diabetes mellitus with diabetic neuropathy, unspecified: Secondary | ICD-10-CM | POA: Diagnosis not present

## 2020-02-27 DIAGNOSIS — N400 Enlarged prostate without lower urinary tract symptoms: Secondary | ICD-10-CM | POA: Diagnosis not present

## 2020-02-27 DIAGNOSIS — Z6841 Body Mass Index (BMI) 40.0 and over, adult: Secondary | ICD-10-CM | POA: Diagnosis not present

## 2020-02-27 DIAGNOSIS — Z7984 Long term (current) use of oral hypoglycemic drugs: Secondary | ICD-10-CM | POA: Diagnosis not present

## 2020-02-27 DIAGNOSIS — M109 Gout, unspecified: Secondary | ICD-10-CM | POA: Diagnosis not present

## 2020-02-27 DIAGNOSIS — I10 Essential (primary) hypertension: Secondary | ICD-10-CM | POA: Diagnosis not present

## 2020-02-27 DIAGNOSIS — E7800 Pure hypercholesterolemia, unspecified: Secondary | ICD-10-CM | POA: Diagnosis not present

## 2020-02-27 DIAGNOSIS — I700 Atherosclerosis of aorta: Secondary | ICD-10-CM | POA: Diagnosis not present

## 2020-03-11 DIAGNOSIS — E1140 Type 2 diabetes mellitus with diabetic neuropathy, unspecified: Secondary | ICD-10-CM | POA: Diagnosis not present

## 2020-03-11 DIAGNOSIS — N401 Enlarged prostate with lower urinary tract symptoms: Secondary | ICD-10-CM | POA: Diagnosis not present

## 2020-03-11 DIAGNOSIS — G8929 Other chronic pain: Secondary | ICD-10-CM | POA: Diagnosis not present

## 2020-03-11 DIAGNOSIS — E119 Type 2 diabetes mellitus without complications: Secondary | ICD-10-CM | POA: Diagnosis not present

## 2020-03-11 DIAGNOSIS — I10 Essential (primary) hypertension: Secondary | ICD-10-CM | POA: Diagnosis not present

## 2020-03-11 DIAGNOSIS — N182 Chronic kidney disease, stage 2 (mild): Secondary | ICD-10-CM | POA: Diagnosis not present

## 2020-03-11 DIAGNOSIS — E7800 Pure hypercholesterolemia, unspecified: Secondary | ICD-10-CM | POA: Diagnosis not present

## 2020-03-11 DIAGNOSIS — K219 Gastro-esophageal reflux disease without esophagitis: Secondary | ICD-10-CM | POA: Diagnosis not present

## 2020-03-11 DIAGNOSIS — N400 Enlarged prostate without lower urinary tract symptoms: Secondary | ICD-10-CM | POA: Diagnosis not present

## 2020-03-11 DIAGNOSIS — E1169 Type 2 diabetes mellitus with other specified complication: Secondary | ICD-10-CM | POA: Diagnosis not present

## 2020-03-11 DIAGNOSIS — M1990 Unspecified osteoarthritis, unspecified site: Secondary | ICD-10-CM | POA: Diagnosis not present

## 2020-03-11 DIAGNOSIS — N1831 Chronic kidney disease, stage 3a: Secondary | ICD-10-CM | POA: Diagnosis not present

## 2020-03-19 ENCOUNTER — Other Ambulatory Visit: Payer: Self-pay | Admitting: Physical Medicine and Rehabilitation

## 2020-03-19 DIAGNOSIS — M48061 Spinal stenosis, lumbar region without neurogenic claudication: Secondary | ICD-10-CM

## 2020-03-26 DIAGNOSIS — E538 Deficiency of other specified B group vitamins: Secondary | ICD-10-CM | POA: Diagnosis not present

## 2020-03-26 DIAGNOSIS — E291 Testicular hypofunction: Secondary | ICD-10-CM | POA: Diagnosis not present

## 2020-04-08 ENCOUNTER — Ambulatory Visit
Admission: RE | Admit: 2020-04-08 | Discharge: 2020-04-08 | Disposition: A | Discharge status: Home / Self Care | Payer: Medicare Other | Source: Ambulatory Visit | Attending: Physical Medicine and Rehabilitation | Admitting: Physical Medicine and Rehabilitation

## 2020-04-08 ENCOUNTER — Other Ambulatory Visit: Payer: Self-pay

## 2020-04-08 DIAGNOSIS — M545 Low back pain, unspecified: Secondary | ICD-10-CM | POA: Diagnosis not present

## 2020-04-08 DIAGNOSIS — M48061 Spinal stenosis, lumbar region without neurogenic claudication: Secondary | ICD-10-CM

## 2020-04-09 ENCOUNTER — Other Ambulatory Visit: Payer: Medicare Other

## 2020-04-25 DIAGNOSIS — N401 Enlarged prostate with lower urinary tract symptoms: Secondary | ICD-10-CM | POA: Diagnosis not present

## 2020-04-25 DIAGNOSIS — M1990 Unspecified osteoarthritis, unspecified site: Secondary | ICD-10-CM | POA: Diagnosis not present

## 2020-04-25 DIAGNOSIS — E7800 Pure hypercholesterolemia, unspecified: Secondary | ICD-10-CM | POA: Diagnosis not present

## 2020-04-25 DIAGNOSIS — I10 Essential (primary) hypertension: Secondary | ICD-10-CM | POA: Diagnosis not present

## 2020-04-25 DIAGNOSIS — G8929 Other chronic pain: Secondary | ICD-10-CM | POA: Diagnosis not present

## 2020-04-25 DIAGNOSIS — E1140 Type 2 diabetes mellitus with diabetic neuropathy, unspecified: Secondary | ICD-10-CM | POA: Diagnosis not present

## 2020-04-25 DIAGNOSIS — E119 Type 2 diabetes mellitus without complications: Secondary | ICD-10-CM | POA: Diagnosis not present

## 2020-04-25 DIAGNOSIS — K219 Gastro-esophageal reflux disease without esophagitis: Secondary | ICD-10-CM | POA: Diagnosis not present

## 2020-04-25 DIAGNOSIS — N1831 Chronic kidney disease, stage 3a: Secondary | ICD-10-CM | POA: Diagnosis not present

## 2020-04-25 DIAGNOSIS — N400 Enlarged prostate without lower urinary tract symptoms: Secondary | ICD-10-CM | POA: Diagnosis not present

## 2020-04-25 DIAGNOSIS — N182 Chronic kidney disease, stage 2 (mild): Secondary | ICD-10-CM | POA: Diagnosis not present

## 2020-04-25 DIAGNOSIS — E1169 Type 2 diabetes mellitus with other specified complication: Secondary | ICD-10-CM | POA: Diagnosis not present

## 2020-05-08 DIAGNOSIS — M48061 Spinal stenosis, lumbar region without neurogenic claudication: Secondary | ICD-10-CM | POA: Diagnosis not present

## 2020-05-27 ENCOUNTER — Ambulatory Visit (INDEPENDENT_AMBULATORY_CARE_PROVIDER_SITE_OTHER): Payer: Medicare Other | Admitting: Podiatry

## 2020-05-27 ENCOUNTER — Other Ambulatory Visit: Payer: Self-pay

## 2020-05-27 DIAGNOSIS — B351 Tinea unguium: Secondary | ICD-10-CM

## 2020-05-27 DIAGNOSIS — E119 Type 2 diabetes mellitus without complications: Secondary | ICD-10-CM | POA: Diagnosis not present

## 2020-05-27 DIAGNOSIS — M79675 Pain in left toe(s): Secondary | ICD-10-CM | POA: Diagnosis not present

## 2020-05-27 DIAGNOSIS — M19072 Primary osteoarthritis, left ankle and foot: Secondary | ICD-10-CM | POA: Diagnosis not present

## 2020-05-27 DIAGNOSIS — M79674 Pain in right toe(s): Secondary | ICD-10-CM

## 2020-05-27 DIAGNOSIS — M659 Synovitis and tenosynovitis, unspecified: Secondary | ICD-10-CM | POA: Diagnosis not present

## 2020-05-27 DIAGNOSIS — M19071 Primary osteoarthritis, right ankle and foot: Secondary | ICD-10-CM

## 2020-05-27 MED ORDER — CELECOXIB 100 MG PO CAPS: 100.0000 mg | ORAL_CAPSULE | Freq: Two times a day (BID) | ORAL | 1 refills | Status: DC

## 2020-05-27 MED ORDER — BETAMETHASONE SOD PHOS & ACET 6 (3-3) MG/ML IJ SUSP: 3.0000 mg | Freq: Once | INTRAMUSCULAR | Status: DC

## 2020-05-27 NOTE — Progress Notes (Signed)
   Subjective:  75 y.o. male presenting today for follow up evaluation of bilateral ankle pain.  He states that he gets some temporary relief with the ankle joint injections.  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 Patient also has pain associated to the bilateral toes.  He states they are occasionally painful.  He complains of elongated nails that are symptomatic.  He is unable to trim his own nails.  He presents for further treatment evaluation   Past Medical History:  Diagnosis Date  . Anemia    B 12 DEFICIENCY  . Arthritis   . Back pain   . Diabetes mellitus without complication ([place])   . Hypertension   . Prostate enlargement   . Sleep apnea    USES C-PAP     Objective / Physical Exam:  General:  The patient is alert and oriented x3 in no acute distress. Dermatology:  Skin is warm, dry and supple bilateral lower extremities. Negative for open lesions or macerations.  Elongated hyperkeratotic nails noted 1-5 bilateral.  He does have history of partial permanent nail avulsions to the medial lateral borders of the bilateral great toes. Vascular:  Palpable pedal pulses bilaterally. No edema or erythema noted. Capillary refill within normal limits. Neurological:  Epicritic and protective threshold grossly intact bilaterally.  Musculoskeletal Exam:  Pain on palpation to the anterior lateral medial aspects of the patient's bilateral ankles. Mild edema noted. Range of motion within normal limits to all pedal and ankle joints bilateral. Muscle strength 5/5 in all groups bilateral.   Assessment: 1. Ankle synovitis bilateral  2.  Pain due to onychomycosis/elongated nails 1-5 bilateral  Plan of Care:  1. Patient was evaluated.  2. Injection of 0.5 mL Celestone Soluspan injected in the patient's bilateral ankle joints.  3. Recommended good shoe gear.  4.  Continue compression anklet's 5.  Mechanical debridement of nails 1-5 bilateral was performed using a  nail nipper without incident or bleeding  6.  Refill prescription for Celebrex 100 mg 2 times daily  7.  Return to clinic in 3 months   Edrick Kins, DPM [facility]  Dr. Edrick Kins, [address]                                        [place], [place] [address]                Office [phone]  Fax [phone]

## 2020-06-03 DIAGNOSIS — E538 Deficiency of other specified B group vitamins: Secondary | ICD-10-CM | POA: Diagnosis not present

## 2020-06-03 DIAGNOSIS — E291 Testicular hypofunction: Secondary | ICD-10-CM | POA: Diagnosis not present

## 2020-06-14 DIAGNOSIS — N1831 Chronic kidney disease, stage 3a: Secondary | ICD-10-CM | POA: Diagnosis not present

## 2020-06-14 DIAGNOSIS — E1169 Type 2 diabetes mellitus with other specified complication: Secondary | ICD-10-CM | POA: Diagnosis not present

## 2020-06-14 DIAGNOSIS — N182 Chronic kidney disease, stage 2 (mild): Secondary | ICD-10-CM | POA: Diagnosis not present

## 2020-06-14 DIAGNOSIS — G8929 Other chronic pain: Secondary | ICD-10-CM | POA: Diagnosis not present

## 2020-06-14 DIAGNOSIS — E7800 Pure hypercholesterolemia, unspecified: Secondary | ICD-10-CM | POA: Diagnosis not present

## 2020-06-14 DIAGNOSIS — K219 Gastro-esophageal reflux disease without esophagitis: Secondary | ICD-10-CM | POA: Diagnosis not present

## 2020-06-14 DIAGNOSIS — E1140 Type 2 diabetes mellitus with diabetic neuropathy, unspecified: Secondary | ICD-10-CM | POA: Diagnosis not present

## 2020-06-14 DIAGNOSIS — I10 Essential (primary) hypertension: Secondary | ICD-10-CM | POA: Diagnosis not present

## 2020-06-14 DIAGNOSIS — N400 Enlarged prostate without lower urinary tract symptoms: Secondary | ICD-10-CM | POA: Diagnosis not present

## 2020-06-14 DIAGNOSIS — M1990 Unspecified osteoarthritis, unspecified site: Secondary | ICD-10-CM | POA: Diagnosis not present

## 2020-06-14 DIAGNOSIS — N401 Enlarged prostate with lower urinary tract symptoms: Secondary | ICD-10-CM | POA: Diagnosis not present

## 2020-06-14 DIAGNOSIS — E119 Type 2 diabetes mellitus without complications: Secondary | ICD-10-CM | POA: Diagnosis not present

## 2020-07-02 DIAGNOSIS — E538 Deficiency of other specified B group vitamins: Secondary | ICD-10-CM | POA: Diagnosis not present

## 2020-07-02 DIAGNOSIS — E291 Testicular hypofunction: Secondary | ICD-10-CM | POA: Diagnosis not present

## 2020-07-03 DIAGNOSIS — M48061 Spinal stenosis, lumbar region without neurogenic claudication: Secondary | ICD-10-CM | POA: Diagnosis not present

## 2020-07-08 DIAGNOSIS — Z23 Encounter for immunization: Secondary | ICD-10-CM | POA: Diagnosis not present

## 2020-08-05 DIAGNOSIS — N1831 Chronic kidney disease, stage 3a: Secondary | ICD-10-CM | POA: Diagnosis not present

## 2020-08-05 DIAGNOSIS — E119 Type 2 diabetes mellitus without complications: Secondary | ICD-10-CM | POA: Diagnosis not present

## 2020-08-05 DIAGNOSIS — K219 Gastro-esophageal reflux disease without esophagitis: Secondary | ICD-10-CM | POA: Diagnosis not present

## 2020-08-05 DIAGNOSIS — I10 Essential (primary) hypertension: Secondary | ICD-10-CM | POA: Diagnosis not present

## 2020-08-05 DIAGNOSIS — N400 Enlarged prostate without lower urinary tract symptoms: Secondary | ICD-10-CM | POA: Diagnosis not present

## 2020-08-05 DIAGNOSIS — E7800 Pure hypercholesterolemia, unspecified: Secondary | ICD-10-CM | POA: Diagnosis not present

## 2020-08-05 DIAGNOSIS — N401 Enlarged prostate with lower urinary tract symptoms: Secondary | ICD-10-CM | POA: Diagnosis not present

## 2020-08-05 DIAGNOSIS — M1990 Unspecified osteoarthritis, unspecified site: Secondary | ICD-10-CM | POA: Diagnosis not present

## 2020-08-05 DIAGNOSIS — E1140 Type 2 diabetes mellitus with diabetic neuropathy, unspecified: Secondary | ICD-10-CM | POA: Diagnosis not present

## 2020-08-05 DIAGNOSIS — G8929 Other chronic pain: Secondary | ICD-10-CM | POA: Diagnosis not present

## 2020-08-05 DIAGNOSIS — N182 Chronic kidney disease, stage 2 (mild): Secondary | ICD-10-CM | POA: Diagnosis not present

## 2020-08-05 DIAGNOSIS — E1169 Type 2 diabetes mellitus with other specified complication: Secondary | ICD-10-CM | POA: Diagnosis not present

## 2020-08-06 DIAGNOSIS — E538 Deficiency of other specified B group vitamins: Secondary | ICD-10-CM | POA: Diagnosis not present

## 2020-08-06 DIAGNOSIS — E291 Testicular hypofunction: Secondary | ICD-10-CM | POA: Diagnosis not present

## 2020-09-10 DIAGNOSIS — E538 Deficiency of other specified B group vitamins: Secondary | ICD-10-CM | POA: Diagnosis not present

## 2020-09-10 DIAGNOSIS — E291 Testicular hypofunction: Secondary | ICD-10-CM | POA: Diagnosis not present

## 2020-09-17 DIAGNOSIS — N390 Urinary tract infection, site not specified: Secondary | ICD-10-CM | POA: Diagnosis not present

## 2020-09-19 DIAGNOSIS — R300 Dysuria: Secondary | ICD-10-CM | POA: Diagnosis not present

## 2020-09-28 DIAGNOSIS — U071 COVID-19: Secondary | ICD-10-CM | POA: Diagnosis not present

## 2020-10-01 DIAGNOSIS — U071 COVID-19: Secondary | ICD-10-CM | POA: Diagnosis not present

## 2020-10-15 DIAGNOSIS — E291 Testicular hypofunction: Secondary | ICD-10-CM | POA: Diagnosis not present

## 2020-10-15 DIAGNOSIS — E119 Type 2 diabetes mellitus without complications: Secondary | ICD-10-CM | POA: Diagnosis not present

## 2020-10-15 DIAGNOSIS — Z961 Presence of intraocular lens: Secondary | ICD-10-CM | POA: Diagnosis not present

## 2020-10-15 DIAGNOSIS — R7989 Other specified abnormal findings of blood chemistry: Secondary | ICD-10-CM | POA: Diagnosis not present

## 2020-10-15 DIAGNOSIS — Z7984 Long term (current) use of oral hypoglycemic drugs: Secondary | ICD-10-CM | POA: Diagnosis not present

## 2020-10-15 DIAGNOSIS — E538 Deficiency of other specified B group vitamins: Secondary | ICD-10-CM | POA: Diagnosis not present

## 2020-10-18 DIAGNOSIS — E7800 Pure hypercholesterolemia, unspecified: Secondary | ICD-10-CM | POA: Diagnosis not present

## 2020-10-18 DIAGNOSIS — E119 Type 2 diabetes mellitus without complications: Secondary | ICD-10-CM | POA: Diagnosis not present

## 2020-10-18 DIAGNOSIS — E1140 Type 2 diabetes mellitus with diabetic neuropathy, unspecified: Secondary | ICD-10-CM | POA: Diagnosis not present

## 2020-10-18 DIAGNOSIS — N1831 Chronic kidney disease, stage 3a: Secondary | ICD-10-CM | POA: Diagnosis not present

## 2020-10-18 DIAGNOSIS — G8929 Other chronic pain: Secondary | ICD-10-CM | POA: Diagnosis not present

## 2020-10-18 DIAGNOSIS — N401 Enlarged prostate with lower urinary tract symptoms: Secondary | ICD-10-CM | POA: Diagnosis not present

## 2020-10-18 DIAGNOSIS — M1990 Unspecified osteoarthritis, unspecified site: Secondary | ICD-10-CM | POA: Diagnosis not present

## 2020-10-18 DIAGNOSIS — N182 Chronic kidney disease, stage 2 (mild): Secondary | ICD-10-CM | POA: Diagnosis not present

## 2020-10-18 DIAGNOSIS — K219 Gastro-esophageal reflux disease without esophagitis: Secondary | ICD-10-CM | POA: Diagnosis not present

## 2020-10-18 DIAGNOSIS — N400 Enlarged prostate without lower urinary tract symptoms: Secondary | ICD-10-CM | POA: Diagnosis not present

## 2020-10-18 DIAGNOSIS — I10 Essential (primary) hypertension: Secondary | ICD-10-CM | POA: Diagnosis not present

## 2020-10-18 DIAGNOSIS — E1169 Type 2 diabetes mellitus with other specified complication: Secondary | ICD-10-CM | POA: Diagnosis not present

## 2020-10-22 DIAGNOSIS — Z20822 Contact with and (suspected) exposure to covid-19: Secondary | ICD-10-CM | POA: Diagnosis not present

## 2020-10-25 DIAGNOSIS — J0100 Acute maxillary sinusitis, unspecified: Secondary | ICD-10-CM | POA: Diagnosis not present

## 2020-10-25 DIAGNOSIS — R519 Headache, unspecified: Secondary | ICD-10-CM | POA: Diagnosis not present

## 2020-10-29 DIAGNOSIS — I10 Essential (primary) hypertension: Secondary | ICD-10-CM | POA: Diagnosis not present

## 2020-10-29 DIAGNOSIS — Z7984 Long term (current) use of oral hypoglycemic drugs: Secondary | ICD-10-CM | POA: Diagnosis not present

## 2020-10-29 DIAGNOSIS — N1831 Chronic kidney disease, stage 3a: Secondary | ICD-10-CM | POA: Diagnosis not present

## 2020-10-29 DIAGNOSIS — I700 Atherosclerosis of aorta: Secondary | ICD-10-CM | POA: Diagnosis not present

## 2020-10-29 DIAGNOSIS — K7581 Nonalcoholic steatohepatitis (NASH): Secondary | ICD-10-CM | POA: Diagnosis not present

## 2020-10-29 DIAGNOSIS — Z1389 Encounter for screening for other disorder: Secondary | ICD-10-CM | POA: Diagnosis not present

## 2020-10-29 DIAGNOSIS — E538 Deficiency of other specified B group vitamins: Secondary | ICD-10-CM | POA: Diagnosis not present

## 2020-10-29 DIAGNOSIS — E291 Testicular hypofunction: Secondary | ICD-10-CM | POA: Diagnosis not present

## 2020-10-29 DIAGNOSIS — E1140 Type 2 diabetes mellitus with diabetic neuropathy, unspecified: Secondary | ICD-10-CM | POA: Diagnosis not present

## 2020-10-29 DIAGNOSIS — G4733 Obstructive sleep apnea (adult) (pediatric): Secondary | ICD-10-CM | POA: Diagnosis not present

## 2020-10-29 DIAGNOSIS — N400 Enlarged prostate without lower urinary tract symptoms: Secondary | ICD-10-CM | POA: Diagnosis not present

## 2020-10-29 DIAGNOSIS — M109 Gout, unspecified: Secondary | ICD-10-CM | POA: Diagnosis not present

## 2020-10-29 DIAGNOSIS — Z0000 Encounter for general adult medical examination without abnormal findings: Secondary | ICD-10-CM | POA: Diagnosis not present

## 2020-10-29 DIAGNOSIS — D696 Thrombocytopenia, unspecified: Secondary | ICD-10-CM | POA: Diagnosis not present

## 2020-10-29 DIAGNOSIS — E7800 Pure hypercholesterolemia, unspecified: Secondary | ICD-10-CM | POA: Diagnosis not present

## 2020-11-19 DIAGNOSIS — E538 Deficiency of other specified B group vitamins: Secondary | ICD-10-CM | POA: Diagnosis not present

## 2020-12-04 DIAGNOSIS — E538 Deficiency of other specified B group vitamins: Secondary | ICD-10-CM | POA: Diagnosis not present

## 2020-12-17 DIAGNOSIS — E538 Deficiency of other specified B group vitamins: Secondary | ICD-10-CM | POA: Diagnosis not present

## 2020-12-19 DIAGNOSIS — R3915 Urgency of urination: Secondary | ICD-10-CM | POA: Diagnosis not present

## 2020-12-19 DIAGNOSIS — N3020 Other chronic cystitis without hematuria: Secondary | ICD-10-CM | POA: Diagnosis not present

## 2020-12-19 DIAGNOSIS — R972 Elevated prostate specific antigen [PSA]: Secondary | ICD-10-CM | POA: Diagnosis not present

## 2020-12-20 DIAGNOSIS — M48061 Spinal stenosis, lumbar region without neurogenic claudication: Secondary | ICD-10-CM | POA: Diagnosis not present

## 2020-12-24 DIAGNOSIS — L309 Dermatitis, unspecified: Secondary | ICD-10-CM | POA: Diagnosis not present

## 2020-12-24 DIAGNOSIS — L700 Acne vulgaris: Secondary | ICD-10-CM | POA: Diagnosis not present

## 2020-12-31 DIAGNOSIS — E538 Deficiency of other specified B group vitamins: Secondary | ICD-10-CM | POA: Diagnosis not present

## 2021-01-14 DIAGNOSIS — E538 Deficiency of other specified B group vitamins: Secondary | ICD-10-CM | POA: Diagnosis not present

## 2021-01-20 DIAGNOSIS — H43391 Other vitreous opacities, right eye: Secondary | ICD-10-CM | POA: Diagnosis not present

## 2021-01-28 DIAGNOSIS — M25511 Pain in right shoulder: Secondary | ICD-10-CM | POA: Diagnosis not present

## 2021-01-28 DIAGNOSIS — H9209 Otalgia, unspecified ear: Secondary | ICD-10-CM | POA: Diagnosis not present

## 2021-01-28 DIAGNOSIS — E538 Deficiency of other specified B group vitamins: Secondary | ICD-10-CM | POA: Diagnosis not present

## 2021-02-11 DIAGNOSIS — E538 Deficiency of other specified B group vitamins: Secondary | ICD-10-CM | POA: Diagnosis not present

## 2021-02-18 DIAGNOSIS — G4733 Obstructive sleep apnea (adult) (pediatric): Secondary | ICD-10-CM | POA: Diagnosis not present

## 2021-02-25 DIAGNOSIS — Z23 Encounter for immunization: Secondary | ICD-10-CM | POA: Diagnosis not present

## 2021-02-25 DIAGNOSIS — E538 Deficiency of other specified B group vitamins: Secondary | ICD-10-CM | POA: Diagnosis not present

## 2021-03-10 DIAGNOSIS — R972 Elevated prostate specific antigen [PSA]: Secondary | ICD-10-CM | POA: Diagnosis not present

## 2021-03-11 ENCOUNTER — Ambulatory Visit
Admission: RE | Admit: 2021-03-11 | Discharge: 2021-03-11 | Disposition: A | Discharge status: Home / Self Care | Payer: Medicare Other | Source: Ambulatory Visit | Attending: Internal Medicine | Admitting: Internal Medicine

## 2021-03-11 ENCOUNTER — Other Ambulatory Visit: Payer: Self-pay | Admitting: Internal Medicine

## 2021-03-11 DIAGNOSIS — R059 Cough, unspecified: Secondary | ICD-10-CM | POA: Diagnosis not present

## 2021-03-11 DIAGNOSIS — R052 Subacute cough: Secondary | ICD-10-CM

## 2021-03-11 DIAGNOSIS — E538 Deficiency of other specified B group vitamins: Secondary | ICD-10-CM | POA: Diagnosis not present

## 2021-03-11 DIAGNOSIS — J0100 Acute maxillary sinusitis, unspecified: Secondary | ICD-10-CM | POA: Diagnosis not present

## 2021-03-11 DIAGNOSIS — J309 Allergic rhinitis, unspecified: Secondary | ICD-10-CM | POA: Diagnosis not present

## 2021-03-11 DIAGNOSIS — M25511 Pain in right shoulder: Secondary | ICD-10-CM | POA: Diagnosis not present

## 2021-03-11 DIAGNOSIS — U071 COVID-19: Secondary | ICD-10-CM | POA: Diagnosis not present

## 2021-03-17 DIAGNOSIS — R972 Elevated prostate specific antigen [PSA]: Secondary | ICD-10-CM | POA: Diagnosis not present

## 2021-03-17 DIAGNOSIS — R3915 Urgency of urination: Secondary | ICD-10-CM | POA: Diagnosis not present

## 2021-03-24 DIAGNOSIS — Z23 Encounter for immunization: Secondary | ICD-10-CM | POA: Diagnosis not present

## 2021-03-24 DIAGNOSIS — L570 Actinic keratosis: Secondary | ICD-10-CM | POA: Diagnosis not present

## 2021-03-24 DIAGNOSIS — R202 Paresthesia of skin: Secondary | ICD-10-CM | POA: Diagnosis not present

## 2021-03-24 DIAGNOSIS — L219 Seborrheic dermatitis, unspecified: Secondary | ICD-10-CM | POA: Diagnosis not present

## 2021-03-25 DIAGNOSIS — C61 Malignant neoplasm of prostate: Secondary | ICD-10-CM | POA: Diagnosis not present

## 2021-03-25 DIAGNOSIS — R972 Elevated prostate specific antigen [PSA]: Secondary | ICD-10-CM | POA: Diagnosis not present

## 2021-04-01 DIAGNOSIS — C61 Malignant neoplasm of prostate: Secondary | ICD-10-CM | POA: Diagnosis not present

## 2021-04-01 DIAGNOSIS — E538 Deficiency of other specified B group vitamins: Secondary | ICD-10-CM | POA: Diagnosis not present

## 2021-04-01 DIAGNOSIS — R3915 Urgency of urination: Secondary | ICD-10-CM | POA: Diagnosis not present

## 2021-04-01 DIAGNOSIS — N3020 Other chronic cystitis without hematuria: Secondary | ICD-10-CM | POA: Diagnosis not present

## 2021-04-04 ENCOUNTER — Other Ambulatory Visit: Payer: Self-pay | Admitting: Urology

## 2021-04-04 DIAGNOSIS — C61 Malignant neoplasm of prostate: Secondary | ICD-10-CM

## 2021-04-07 DIAGNOSIS — C61 Malignant neoplasm of prostate: Secondary | ICD-10-CM | POA: Diagnosis not present

## 2021-04-07 DIAGNOSIS — I700 Atherosclerosis of aorta: Secondary | ICD-10-CM | POA: Diagnosis not present

## 2021-04-07 DIAGNOSIS — K760 Fatty (change of) liver, not elsewhere classified: Secondary | ICD-10-CM | POA: Diagnosis not present

## 2021-04-16 NOTE — Progress Notes (Signed)
GU Location of Tumor / Histology: Prostate Ca  If Prostate Cancer, Gleason Score is (4 + 4) and PSA is (7.13 as of 11/22)  Biopsies  Dr. Tresa Moore        Past/Anticipated interventions by urology, if any:   Past/Anticipated interventions by medical oncology, if any:   Weight changes, if any: No  IPSS:   9 SHIM:  19  Bowel/Bladder complaints, if any:  No bladder or bowel issues.  Nausea/Vomiting, if any: No  Pain issues, if any:  0/10  SAFETY ISSUES: Prior radiation?   No Pacemaker/ICD?  No Possible current pregnancy?  Male Is the patient on methotrexate?  No  Current Complaints / other details:  Need more information on treatment.

## 2021-04-18 ENCOUNTER — Other Ambulatory Visit: Payer: Self-pay

## 2021-04-18 ENCOUNTER — Encounter (HOSPITAL_COMMUNITY)
Admission: RE | Admit: 2021-04-18 | Discharge: 2021-04-18 | Disposition: A | Discharge status: Home / Self Care | Payer: Medicare Other | Source: Ambulatory Visit | Attending: Urology | Admitting: Urology

## 2021-04-18 DIAGNOSIS — C61 Malignant neoplasm of prostate: Secondary | ICD-10-CM | POA: Insufficient documentation

## 2021-04-18 MED ORDER — TECHNETIUM TC 99M MEDRONATE IV KIT
21.4000 | PACK | Freq: Once | INTRAVENOUS | Status: AC
  Administered 2021-04-18: 12:00:00 21.4 via INTRAVENOUS

## 2021-04-22 ENCOUNTER — Ambulatory Visit
Admission: RE | Admit: 2021-04-22 | Discharge: 2021-04-22 | Disposition: A | Discharge status: Home / Self Care | Payer: Medicare Other | Source: Ambulatory Visit | Attending: Radiation Oncology | Admitting: Radiation Oncology

## 2021-04-22 ENCOUNTER — Other Ambulatory Visit: Payer: Self-pay

## 2021-04-22 VITALS — BP 146/57 | HR 73 | Temp 98.3°F | Resp 20 | Ht 72.0 in | Wt 320.2 lb

## 2021-04-22 DIAGNOSIS — Z87891 Personal history of nicotine dependence: Secondary | ICD-10-CM | POA: Diagnosis not present

## 2021-04-22 DIAGNOSIS — G4730 Sleep apnea, unspecified: Secondary | ICD-10-CM | POA: Diagnosis not present

## 2021-04-22 DIAGNOSIS — E119 Type 2 diabetes mellitus without complications: Secondary | ICD-10-CM | POA: Insufficient documentation

## 2021-04-22 DIAGNOSIS — C61 Malignant neoplasm of prostate: Secondary | ICD-10-CM

## 2021-04-22 DIAGNOSIS — Z7984 Long term (current) use of oral hypoglycemic drugs: Secondary | ICD-10-CM | POA: Insufficient documentation

## 2021-04-22 DIAGNOSIS — M129 Arthropathy, unspecified: Secondary | ICD-10-CM | POA: Diagnosis not present

## 2021-04-22 DIAGNOSIS — I10 Essential (primary) hypertension: Secondary | ICD-10-CM | POA: Diagnosis not present

## 2021-04-22 DIAGNOSIS — Z7901 Long term (current) use of anticoagulants: Secondary | ICD-10-CM | POA: Diagnosis not present

## 2021-04-22 DIAGNOSIS — N400 Enlarged prostate without lower urinary tract symptoms: Secondary | ICD-10-CM | POA: Diagnosis not present

## 2021-04-22 DIAGNOSIS — E538 Deficiency of other specified B group vitamins: Secondary | ICD-10-CM | POA: Diagnosis not present

## 2021-04-22 NOTE — Progress Notes (Signed)
Radiation Oncology         [phone] ________________________________  Initial Outpatient Consultation  Name: Gregory Estrada MRN: [id number]  Date: 04/22/2021  DOB: May 29, 1945  PJ:ASNKNL, D[place] Ar, MD  Alexis Frock, MD   REFERRING PHYSICIAN: Alexis Frock, MD  DIAGNOSIS: 76 y.o. gentleman with Stage T1c adenocarcinoma of the prostate with Gleason score of 4+4, and PSA of 7.13.    ICD-10-CM   1. Malignant neoplasm of prostate ([place])  C61       HISTORY OF PRESENT ILLNESS: Gregory Estrada is a 76 y.o. male with a diagnosis of prostate cancer. He is an established urology patient and has a longstanding history of LUTS, managed with Cardura and Flomax daily since at least 2015.  His PSA was previously in the normal range, at 3.2 in 2021 but was noted to have become elevated at 6.08 and 11/06/2020, on routine labs with his primary care physician, Dr. Lysle Rubens.  Accordingly, he was referred back to Dr. Tresa Moore, in urology on 12/19/20,  digital rectal examination was performed at that time revealing no nodules.  A repeat PSA in 02/2021 showed further elevation to 7.13 so the patient proceeded to transrectal ultrasound with 12 biopsies of the prostate on 03/25/21.  The prostate volume measured 75 cc.  Out of 12 core biopsies, 6 were positive.  The maximum Gleason score was 4+4, and this was seen in the left base lateral. Additionally, Gleason 4+3 was seen in the left mid lateral (with perineural invasion), left apex lateral, left mid, and left apex. Also, a small focus of Gleason 3+3 was seen in the right apex.  He underwent staging CT A/P on 04/07/21 showing no evidence of metastatic prostate cancer. Bone scan performed on 04/18/21 was also negative for osseous metastatic disease.  The patient reviewed the biopsy results with his urologist and he has kindly been referred today for discussion of potential radiation treatment options.   PREVIOUS RADIATION THERAPY: No  PAST MEDICAL HISTORY:  Past  Medical History:  Diagnosis Date   Anemia    B 12 DEFICIENCY   Arthritis    Back pain    Diabetes mellitus without complication ([place])    Hypertension    Prostate enlargement    Sleep apnea    USES C-PAP      PAST SURGICAL HISTORY: Past Surgical History:  Procedure Laterality Date   APPENDECTOMY     BACK SURGERY     REFRACTIVE SURGERY  2008   bilateral eyes   ROTATOR CUFF REPAIR     RT   TOTAL KNEE ARTHROPLASTY  02/29/2012   Procedure: TOTAL KNEE ARTHROPLASTY;  Surgeon: Gearlean Alf, MD;  Location: [facility];  Service: Orthopedics;  Laterality: Left;   TOTAL KNEE ARTHROPLASTY Right 09/26/2012   Procedure: RIGHT TOTAL KNEE ARTHROPLASTY;  Surgeon: Gearlean Alf, MD;  Location: [facility];  Service: Orthopedics;  Laterality: Right;    FAMILY HISTORY: No family history on file.  SOCIAL HISTORY:  Social History   Socioeconomic History   Marital status: Divorced    Spouse name: Not on file   Number of children: Not on file   Years of education: Not on file   Highest education level: Not on file  Occupational History   Not on file  Tobacco Use   Smoking status: Former    Types: Cigarettes    Quit date: 02/22/1981    Years since quitting: 40.1   Smokeless tobacco: Never  Substance and Sexual Activity   Alcohol  use: No   Drug use: No   Sexual activity: Not on file  Other Topics Concern   Not on file  Social History Narrative   Not on file   Social Determinants of Health   Financial Resource Strain: Not on file  Food Insecurity: Not on file  Transportation Needs: Not on file  Physical Activity: Not on file  Stress: Not on file  Social Connections: Not on file  Intimate Partner Violence: Not on file    ALLERGIES: Patient has no known allergies.  MEDICATIONS:  Current Outpatient Medications  Medication Sig Dispense Refill   cetirizine (ZYRTEC) 10 MG tablet 1 tablet     acetaminophen (TYLENOL) 325 MG tablet Take 2 tablets (650 mg total) by mouth every 6 (six)  hours as needed. 60 tablet 0   alum & mag hydroxide-simeth (MAALOX/MYLANTA) 200-200-20 MG/5ML suspension Take 30 mLs by mouth every 6 (six) hours as needed. 355 mL 0   B-D UF III MINI PEN NEEDLES 31G X 5 MM MISC USE 1 SUBCUTANEOUSLY FOR 30 DAYS     bisacodyl (DULCOLAX) 10 MG suppository Place 1 suppository (10 mg total) rectally daily as needed. 12 suppository 0   cefUROXime (CEFTIN) 500 MG tablet Take 500 mg by mouth 2 (two) times daily.     celecoxib (CELEBREX) 100 MG capsule Take 1 capsule (100 mg total) by mouth 2 (two) times daily. 60 capsule 1   cephALEXin (KEFLEX) 500 MG capsule Take 1 capsule (500 mg total) by mouth 3 (three) times daily. 30 capsule 0   diclofenac (VOLTAREN) 75 MG EC tablet Take 1 tablet (75 mg total) by mouth 2 (two) times daily. 60 tablet 1   diphenhydrAMINE (BENADRYL) 12.5 MG/5ML elixir Take 5-10 mLs (12.5-25 mg total) by mouth every 4 (four) hours as needed for itching. 120 mL 0   docusate sodium 100 MG CAPS Take 100 mg by mouth 2 (two) times daily. 60 capsule 0   doxazosin (CARDURA) 4 MG tablet Take 4 mg by mouth every evening.     doxazosin (CARDURA) 4 MG tablet 1 tablet     fluticasone (FLONASE) 50 MCG/ACT nasal spray 1 spray in each nostril     gentamicin cream (GARAMYCIN) 0.1 % Apply 1 application topically 2 (two) times daily. (Patient not taking: Reported on 05/10/2019) 15 g 1   glipiZIDE (GLUCOTROL) 5 MG tablet Take by mouth daily before breakfast.     hydrocortisone 2.5 % cream Apply topically 2 (two) times daily.     injection device for insulin (B-D PEN MINI) DEVI See admin instructions.     JARDIANCE 25 MG TABS tablet TAKE 1 TABLET BY MOUTH ONCE DAILY FOR 30 DAYS     ketoconazole (NIZORAL) 2 % cream Apply 1 application topically daily as needed.     Lancets (ONETOUCH DELICA PLUS [id number]) MISC 2 (two) times daily. as directed     levocetirizine (XYZAL) 5 MG tablet TAKE 1 TABLET BY MOUTH ONCE DAILY IN THE EVENING FOR 30 DAYS     lisinopril  (PRINIVIL,ZESTRIL) 10 MG tablet Take 10 mg by mouth every morning.      meloxicam (MOBIC) 15 MG tablet Take 1 tablet (15 mg total) by mouth daily. 30 tablet 1   metFORMIN (GLUCOPHAGE) 1000 MG tablet TAKE 1 TABLET BY MOUTH TWICE DAILY WITH A MEAL FOR 30 DAYS     methocarbamol (ROBAXIN) 500 MG tablet Take 1 tablet (500 mg total) by mouth every 6 (six) hours as needed. 80 tablet 0  methylcellulose (ARTIFICIAL TEARS) 1 % ophthalmic solution Place 1 drop into both eyes daily as needed (for dry eyes).     metoCLOPramide (REGLAN) 5 MG tablet Take 1-2 tablets (5-10 mg total) by mouth every 6 (six) hours as needed (if ondansetron (ZOFRAN) ineffective.). 40 tablet 0   ondansetron (ZOFRAN) 4 MG tablet Take 1 tablet (4 mg total) by mouth every 6 (six) hours as needed for nausea. 40 tablet 0   ONETOUCH VERIO test strip FOR USE WHEN CHECKING BLOOD GLUCOSE UP TO TWICE DAILY. FINGER STICK     oxyCODONE (OXY IR/ROXICODONE) 5 MG immediate release tablet Take 1 tablet every 8 hours . Take 1 to 2 tablets every 3 hours as needed 180 tablet 0   OZEMPIC, 0.25 OR 0.5 MG/DOSE, 2 MG/1.5ML SOPN INJECT 0.5MG  ONCE A WEEK FOR 30 DAYS     polyethylene glycol (MIRALAX / GLYCOLAX) packet Take 17 g by mouth daily as needed. 14 each 0   rivaroxaban (XARELTO) 10 MG TABS tablet Take 1 tablet (10 mg total) by mouth daily with breakfast. Take Xarelto for two and a half more weeks, then discontinue Xarelto. 18 tablet 0   Saxagliptin-Metformin (KOMBIGLYZE XR) 2.08-998 MG TB24 Take 2 tablets by mouth at bedtime.      tamsulosin (FLOMAX) 0.4 MG CAPS capsule Take 0.4 mg by mouth.     tamsulosin (FLOMAX) 0.4 MG CAPS capsule 1 capsule     TOUJEO MAX SOLOSTAR 300 UNIT/ML SOPN INJECT 15 UNITS SUBCUTANEOUSLY ONCE DAILY FOR 30 DAYS     traMADol (ULTRAM) 50 MG tablet Take 1-2 tablets (50-100 mg total) by mouth every 6 (six) hours as needed (mild pain). 60 tablet 0   vitamin B-12 (CYANOCOBALAMIN) 1000 MCG tablet Take 1,000 mcg by mouth daily.      Current Facility-Administered Medications  Medication Dose Route Frequency Provider Last Rate Last Admin   betamethasone acetate-betamethasone sodium phosphate (CELESTONE) injection 3 mg  3 mg Intra-articular Once Edrick Kins, DPM        REVIEW OF SYSTEMS:  On review of systems, the patient reports that he is doing well overall. He denies any chest pain, shortness of breath, cough, fevers, chills, night sweats, unintended weight changes. He denies any bowel disturbances, and denies abdominal pain, nausea or vomiting. He denies any new musculoskeletal or joint aches or pains. His IPSS was 9, indicating mild urinary symptoms with nocturia x3, weak flow of stream and intermittency despite taking Cardura and Flomax daily as prescribed. His SHIM was 19, indicating he has mild erectile dysfunction. A complete review of systems is obtained and is otherwise negative.    PHYSICAL EXAM:  Wt Readings from Last 3 Encounters:  04/22/21 (!) 320 lb 3.2 oz (145.2 kg)  06/27/18 (!) 322 lb 1.6 oz (146.1 kg)  10/06/16 (!) 319 lb (144.7 kg)   Temp Readings from Last 3 Encounters:  04/22/21 98.3 F (36.8 C)  01/18/19 (!) 97.5 F (36.4 C)  01/05/19 98.2 F (36.8 C)   BP Readings from Last 3 Encounters:  04/22/21 (!) 146/57  12/28/18 (!) 133/58  06/27/18 (!) 151/56   Pulse Readings from Last 3 Encounters:  04/22/21 73  12/28/18 70  06/27/18 92   Pain Assessment Pain Score: 6  Pain Loc: Ankle (Hands, ankles, and legs.)/10  In general this is a well appearing Caucasian male in no acute distress. He's alert and oriented x4 and appropriate throughout the examination. Cardiopulmonary assessment is negative for acute distress, and he exhibits normal effort.  KPS = 90  100 - Normal; no complaints; no evidence of disease. 90   - Able to carry on normal activity; minor signs or symptoms of disease. 80   - Normal activity with effort; some signs or symptoms of disease. 50   - Cares for self;  unable to carry on normal activity or to do active work. 60   - Requires occasional assistance, but is able to care for most of his personal needs. 50   - Requires considerable assistance and frequent medical care. 88   - Disabled; requires special care and assistance. 40   - Severely disabled; hospital admission is indicated although death not imminent. 71   - Very sick; hospital admission necessary; active supportive treatment necessary. 10   - Moribund; fatal processes progressing rapidly. 0     - Dead  Karnofsky DA, Abelmann [place], Craver LS and Burchenal [facility] [phone]) The use of the nitrogen mustards in the palliative treatment of carcinoma: with particular reference to bronchogenic carcinoma Cancer 1 634-56  LABORATORY DATA:  Lab Results  Component Value Date   WBC 7.0 09/29/2012   HGB 9.2 (L) 09/29/2012   HCT 28.3 (L) 09/29/2012   MCV 80.2 09/29/2012   PLT 131 (L) 09/29/2012   Lab Results  Component Value Date   NA 134 (L) 09/28/2012   K 4.1 09/28/2012   CL 101 09/28/2012   CO2 25 09/28/2012   Lab Results  Component Value Date   ALT 29 09/20/2012   AST 19 09/20/2012   ALKPHOS 104 09/20/2012   BILITOT 0.3 09/20/2012     RADIOGRAPHY: NM Bone Scan Whole Body  Result Date: 04/20/2021 CLINICAL DATA:  New diagnosis prostate cancer. EXAM: NUCLEAR MEDICINE WHOLE BODY BONE SCAN TECHNIQUE: Whole body anterior and posterior images were obtained approximately 3 hours after intravenous injection of radiopharmaceutical. RADIOPHARMACEUTICALS:  21.4 mCi Technetium-91m MDP IV COMPARISON:  No prior bone scintigraphy.CT is available without and with contrast of the abdomen and pelvis and reconstructions 04/07/2021. FINDINGS: No osteoblastic metastatic lesion is seen in the axial and visualized appendicular skeleton. Activity consistent with degenerative arthrosis is noted in the wrists and ankles with additional degenerative type activity in the spine. At the knees, there are photopenic defects  consistent with total joint arthroplasties. Soft tissue and renal uptake are unremarkable. IMPRESSION: No scintigraphic evidence of osteoblastic metastatic disease. Electronically Signed   By: Telford Nab M.D.   On: 04/20/2021 23:53      IMPRESSION/PLAN: 1. 76 y.o. gentleman with Stage T1c adenocarcinoma of the prostate with Gleason Score of 4+4, and PSA of 7.13. We discussed the patient's workup and outlined the nature of prostate cancer in this setting. The patient's T stage, Gleason's score, and PSA put him into the high risk group. Accordingly, he is eligible for a variety of potential treatment options including prostatectomy or LT-ADT in combination with either 8 weeks of external radiation or 5 weeks of external radiation with an upfront brachytherapy boost.  He is not felt to be a good surgical candidate given his advanced age and multiple medical comorbidities.  Therefore, we discussed the available radiation techniques, and focused on the details and logistics of delivery. The patient is not an ideal candidate for brachytherapy boost with a prostate volume of 75 cc and his current level of urinary symptoms despite medical management. We discussed and outlined the risks, benefits, short and long-term effects associated with daily external beam radiotherapy and compared and contrasted these with prostatectomy. We discussed the  role of SpaceOAR gel in reducing the rectal toxicity associated with radiotherapy. We also detailed the role of ADT in the treatment of high risk prostate cancer and outlined the associated side effects that could be expected with this therapy.  We explained the rationale behind the intentional delay in starting the radiation treatments for approximately 2 months after initiating ADT to allow for the radiosensitizing effects of the ADT.  He was encouraged to ask questions that were answered to his stated satisfaction.  At the conclusion of our conversation, the patient is  interested in moving forward with 8 weeks of external beam therapy concurrent with LT-ADT. We will share our discussion with Dr. Tresa Moore and make arrangements for a follow-up visit to start ADT, first available.  He has a scheduled follow-up visit with Dr. Tresa Moore on 04/24/2021 and is hopeful that he can get the ADT at that time.  We will also proceed with coordinating for fiducial markers and SpaceOAR gel placement, in late February or early March, prior to simulation, to reduce rectal toxicity from radiotherapy. The patient appears to have a good understanding of his disease and our treatment recommendations which are of curative intent and is in agreement with the stated plan.  Therefore, we will move forward with treatment planning accordingly, in anticipation of beginning IMRT approximately 2 months after starting ADT.  We personally spent 70 minutes in this encounter including chart review, reviewing radiological studies, meeting face-to-face with the patient, entering orders and completing documentation.    Nicholos Johns, PA-C    Tyler Pita, MD  [facility] Direct Dial: [phone]   Fax: [phone] [email or web address]   Skype   [email or web address]   This document serves as a record of services personally performed by Tyler Pita, MD and Freeman Caldron, PA-C. It was created on their behalf by Wilburn Mylar, a trained medical scribe. The creation of this record is based on the scribe's personal observations and the provider's statements to them. This document has been checked and approved by the attending provider.

## 2021-04-22 NOTE — Progress Notes (Signed)
Introduced myself to the patient as the prostate nurse navigator.  No barriers to care identified at this time.  He is here to discuss his radiation treatment options.  I gave him my business card and asked him to call me with questions or concerns.  Verbalized understanding.  ?

## 2021-04-23 ENCOUNTER — Telehealth: Payer: Self-pay | Admitting: *Deleted

## 2021-04-23 DIAGNOSIS — Z191 Hormone sensitive malignancy status: Secondary | ICD-10-CM | POA: Diagnosis not present

## 2021-04-23 DIAGNOSIS — C61 Malignant neoplasm of prostate: Secondary | ICD-10-CM | POA: Diagnosis not present

## 2021-04-23 NOTE — Telephone Encounter (Signed)
CALLED PATIENT AND INFORMED THAT I CALLED DR. MANNY'S OFFICE AND SPOKE WITH HIS SCHEDULER AND SHE PUT A REQUEST IN FOR ADT ON 04-24-21, PATIENT VERIFIED UNDERSTANDING THIS

## 2021-04-24 DIAGNOSIS — R3915 Urgency of urination: Secondary | ICD-10-CM | POA: Diagnosis not present

## 2021-04-24 DIAGNOSIS — C61 Malignant neoplasm of prostate: Secondary | ICD-10-CM | POA: Diagnosis not present

## 2021-04-30 ENCOUNTER — Ambulatory Visit (INDEPENDENT_AMBULATORY_CARE_PROVIDER_SITE_OTHER): Payer: Medicare Other | Admitting: Podiatry

## 2021-04-30 ENCOUNTER — Ambulatory Visit (INDEPENDENT_AMBULATORY_CARE_PROVIDER_SITE_OTHER): Payer: Medicare Other

## 2021-04-30 ENCOUNTER — Other Ambulatory Visit: Payer: Self-pay

## 2021-04-30 DIAGNOSIS — N200 Calculus of kidney: Secondary | ICD-10-CM | POA: Insufficient documentation

## 2021-04-30 DIAGNOSIS — M659 Synovitis and tenosynovitis, unspecified: Secondary | ICD-10-CM

## 2021-04-30 DIAGNOSIS — N1831 Chronic kidney disease, stage 3a: Secondary | ICD-10-CM | POA: Insufficient documentation

## 2021-04-30 DIAGNOSIS — E291 Testicular hypofunction: Secondary | ICD-10-CM | POA: Insufficient documentation

## 2021-04-30 DIAGNOSIS — E7800 Pure hypercholesterolemia, unspecified: Secondary | ICD-10-CM | POA: Insufficient documentation

## 2021-04-30 DIAGNOSIS — K760 Fatty (change of) liver, not elsewhere classified: Secondary | ICD-10-CM | POA: Insufficient documentation

## 2021-04-30 DIAGNOSIS — Z860100 Personal history of colon polyps, unspecified: Secondary | ICD-10-CM | POA: Insufficient documentation

## 2021-04-30 DIAGNOSIS — R5383 Other fatigue: Secondary | ICD-10-CM | POA: Insufficient documentation

## 2021-04-30 DIAGNOSIS — G4733 Obstructive sleep apnea (adult) (pediatric): Secondary | ICD-10-CM | POA: Insufficient documentation

## 2021-04-30 DIAGNOSIS — J309 Allergic rhinitis, unspecified: Secondary | ICD-10-CM | POA: Insufficient documentation

## 2021-04-30 DIAGNOSIS — K6389 Other specified diseases of intestine: Secondary | ICD-10-CM | POA: Insufficient documentation

## 2021-04-30 DIAGNOSIS — R350 Frequency of micturition: Secondary | ICD-10-CM | POA: Insufficient documentation

## 2021-04-30 DIAGNOSIS — I700 Atherosclerosis of aorta: Secondary | ICD-10-CM | POA: Insufficient documentation

## 2021-04-30 DIAGNOSIS — M5136 Other intervertebral disc degeneration, lumbar region: Secondary | ICD-10-CM | POA: Insufficient documentation

## 2021-04-30 DIAGNOSIS — K219 Gastro-esophageal reflux disease without esophagitis: Secondary | ICD-10-CM | POA: Insufficient documentation

## 2021-04-30 DIAGNOSIS — E1142 Type 2 diabetes mellitus with diabetic polyneuropathy: Secondary | ICD-10-CM | POA: Insufficient documentation

## 2021-04-30 DIAGNOSIS — G8929 Other chronic pain: Secondary | ICD-10-CM | POA: Insufficient documentation

## 2021-04-30 DIAGNOSIS — N182 Chronic kidney disease, stage 2 (mild): Secondary | ICD-10-CM | POA: Insufficient documentation

## 2021-04-30 DIAGNOSIS — M109 Gout, unspecified: Secondary | ICD-10-CM | POA: Insufficient documentation

## 2021-04-30 DIAGNOSIS — R7989 Other specified abnormal findings of blood chemistry: Secondary | ICD-10-CM | POA: Insufficient documentation

## 2021-04-30 MED ORDER — BETAMETHASONE SOD PHOS & ACET 6 (3-3) MG/ML IJ SUSP
3.0000 mg | Freq: Once | INTRAMUSCULAR | Status: AC
  Administered 2021-04-30: 3 mg via INTRA_ARTICULAR

## 2021-04-30 MED ORDER — CELECOXIB 100 MG PO CAPS: 100.0000 mg | ORAL_CAPSULE | Freq: Two times a day (BID) | ORAL | 1 refills | Status: AC

## 2021-04-30 NOTE — Progress Notes (Signed)
° °  Subjective:  76 y.o. male presenting today for follow-up evaluation of bilateral ankle pain.  Patient last seen in the office on 05/27/2020.  At that time injections were administered and he says that the injections helped significantly.  Slowly over the last month or 2 the pain has returned.  He presents for follow-up treatment and evaluation   Past Medical History:  Diagnosis Date   Anemia    B 12 DEFICIENCY   Arthritis    Back pain    Diabetes mellitus without complication ([place])    Hypertension    Prostate enlargement    Sleep apnea    USES C-PAP   Past Surgical History:  Procedure Laterality Date   APPENDECTOMY     BACK SURGERY     REFRACTIVE SURGERY  2008   bilateral eyes   ROTATOR CUFF REPAIR     RT   TOTAL KNEE ARTHROPLASTY  02/29/2012   Procedure: TOTAL KNEE ARTHROPLASTY;  Surgeon: Gearlean Alf, MD;  Location: [facility];  Service: Orthopedics;  Laterality: Left;   TOTAL KNEE ARTHROPLASTY Right 09/26/2012   Procedure: RIGHT TOTAL KNEE ARTHROPLASTY;  Surgeon: Gearlean Alf, MD;  Location: [facility];  Service: Orthopedics;  Laterality: Right;   No Known Allergies   Objective / Physical Exam:  General:  The patient is alert and oriented x3 in no acute distress. Dermatology:  Skin is warm, dry and supple bilateral lower extremities. Negative for open lesions or macerations. Vascular:  Palpable pedal pulses bilaterally. No edema or erythema noted. Capillary refill within normal limits. Neurological:  Epicritic and protective threshold grossly intact bilaterally.  Musculoskeletal Exam:  Pain on palpation to the anterior lateral medial aspects of the patient's bilateral ankles. Mild edema noted. Range of motion within normal limits to all pedal and ankle joints bilateral. Muscle strength 5/5 in all groups bilateral.   Radiographic Exam:  Normal osseous mineralization. Joint spaces preserved. No fracture/dislocation/boney destruction.    Assessment: 1.  Capsulitis  bilateral ankles  Plan of Care:  1. Patient was evaluated. X-Rays reviewed.  2. Injection of 0.5 mL Celestone Soluspan injected in the patient's bilateral ankle joints.  3.  Refill prescription for Celebrex 100 mg daily 4.  Compression ankle sleeves dispensed.  Wear daily.  Patient states that this has been very helpful in the past 5.  Return to clinic as needed    Edrick Kins, DPM [facility]  Dr. Edrick Kins, DPM    [address], [place] [address]                Office [phone]  Fax [phone]

## 2021-05-06 DIAGNOSIS — E538 Deficiency of other specified B group vitamins: Secondary | ICD-10-CM | POA: Diagnosis not present

## 2021-05-09 DIAGNOSIS — I700 Atherosclerosis of aorta: Secondary | ICD-10-CM | POA: Diagnosis not present

## 2021-05-09 DIAGNOSIS — E7800 Pure hypercholesterolemia, unspecified: Secondary | ICD-10-CM | POA: Diagnosis not present

## 2021-05-09 DIAGNOSIS — E538 Deficiency of other specified B group vitamins: Secondary | ICD-10-CM | POA: Diagnosis not present

## 2021-05-09 DIAGNOSIS — E1140 Type 2 diabetes mellitus with diabetic neuropathy, unspecified: Secondary | ICD-10-CM | POA: Diagnosis not present

## 2021-05-09 DIAGNOSIS — J309 Allergic rhinitis, unspecified: Secondary | ICD-10-CM | POA: Diagnosis not present

## 2021-05-09 DIAGNOSIS — I10 Essential (primary) hypertension: Secondary | ICD-10-CM | POA: Diagnosis not present

## 2021-05-09 DIAGNOSIS — Z5111 Encounter for antineoplastic chemotherapy: Secondary | ICD-10-CM | POA: Diagnosis not present

## 2021-05-09 DIAGNOSIS — M109 Gout, unspecified: Secondary | ICD-10-CM | POA: Diagnosis not present

## 2021-05-09 DIAGNOSIS — G4733 Obstructive sleep apnea (adult) (pediatric): Secondary | ICD-10-CM | POA: Diagnosis not present

## 2021-05-09 DIAGNOSIS — K7581 Nonalcoholic steatohepatitis (NASH): Secondary | ICD-10-CM | POA: Diagnosis not present

## 2021-05-09 DIAGNOSIS — N1831 Chronic kidney disease, stage 3a: Secondary | ICD-10-CM | POA: Diagnosis not present

## 2021-05-09 DIAGNOSIS — N400 Enlarged prostate without lower urinary tract symptoms: Secondary | ICD-10-CM | POA: Diagnosis not present

## 2021-05-09 DIAGNOSIS — C61 Malignant neoplasm of prostate: Secondary | ICD-10-CM | POA: Diagnosis not present

## 2021-05-13 ENCOUNTER — Other Ambulatory Visit: Payer: Self-pay | Admitting: Urology

## 2021-05-14 ENCOUNTER — Telehealth: Payer: Self-pay | Admitting: *Deleted

## 2021-05-14 NOTE — Telephone Encounter (Signed)
CALLED PATIENT TO INFORM OF SIM APPT. FOR 06-30-21- ARRIVAL TIME- 8:45 AM @ [facility], PATIENT TO ARRIVE WITH FULL BLADDER AND EMPTY BOWEL, SPOKE WITH PATIENT AND HE IS AWARE OF THIS APPT.

## 2021-05-20 DIAGNOSIS — E538 Deficiency of other specified B group vitamins: Secondary | ICD-10-CM | POA: Diagnosis not present

## 2021-06-10 DIAGNOSIS — C61 Malignant neoplasm of prostate: Secondary | ICD-10-CM | POA: Diagnosis not present

## 2021-06-10 DIAGNOSIS — Z01818 Encounter for other preprocedural examination: Secondary | ICD-10-CM | POA: Diagnosis not present

## 2021-06-17 ENCOUNTER — Other Ambulatory Visit: Payer: Self-pay

## 2021-06-17 ENCOUNTER — Encounter (HOSPITAL_BASED_OUTPATIENT_CLINIC_OR_DEPARTMENT_OTHER): Payer: Self-pay | Admitting: Urology

## 2021-06-17 DIAGNOSIS — E7800 Pure hypercholesterolemia, unspecified: Secondary | ICD-10-CM | POA: Diagnosis not present

## 2021-06-17 DIAGNOSIS — I10 Essential (primary) hypertension: Secondary | ICD-10-CM | POA: Diagnosis not present

## 2021-06-17 DIAGNOSIS — E1140 Type 2 diabetes mellitus with diabetic neuropathy, unspecified: Secondary | ICD-10-CM | POA: Diagnosis not present

## 2021-06-17 NOTE — Progress Notes (Signed)
Spoke w/ via phone for pre-op interview--- Gregory Estrada Lab needs dos----     EKG and ISTAT          Lab results------ COVID test -----patient states asymptomatic no test needed Arrive at -------0730 NPO after MN NO Solid Food.   Med rec completed Medications to take morning of surgery ----- Flonase PRN Diabetic medication ----- none DOS Patient instructed no nail polish to be worn day of surgery Patient instructed to bring photo id and insurance card day of surgery Patient aware to have Driver (ride ) / caregiver    for 24 hours after surgery. Unknown at this time, pt aware and verbalized understanding that he has to have driver and someone to stay with him 24 hrs after surgery. Patient Special Instructions ----- Bring CPAP day of surgery. Pre-Op special Istructions ----- Fleets enema per Surgeon instructions. Patient verbalized understanding of instructions that were given at this phone interview. Patient denies shortness of breath, chest pain, fever, cough at this phone interview.

## 2021-06-23 NOTE — H&P (Signed)
1 - Lower Urinary Tract Symptoms - Moderate baseline bother from slowly progressive obstructive symptoms, now on Doxazosin '4mg'$  + tamsulosin with good control. Prostate vol 71m by imaging 2022. PVR 2022 "08m. 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  ? ?2 - Recurrent Cystitis - Two episodes simple cystitis 2015. PVR 2015 "72m36m CT stone study 07/2013 w/o hydro or stones. He is diabetic.  ? ?3 - High Risk Prostate Cancer - 6/12 cores up to 70% grade 1-4 cancer by BX 02/2021 on eval rising PSA to 7.13. TRUS 75 mL, no median. CT, BS 03/2021 clinicall localized. [place] for 2 years androgen deprivation and 8 weeks external beam radiation.  ? ?4 - Estrada Testosterone - on exogenous androgens per PCP for indication of fatigue. He has noticed little change on therapy. He is morbidly obese with lower urinary tract symptoms. No baseline endocrine eval avail for review. This is being held in setting of prostate cancer.  ? 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 No CV disease No strong blood thinners. 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  ? ? ?04/2021: [name] seen in f/u above and discuss initiation of androgen deprivation. Had productive meeting with Gregory Estrada rad onc in interval and wants to proceed with primary radiaton / androgen deprivation.  ? ?06/10/2021: Started on bicalutamide for 30 days after last office visit. He received 6-m39-monthgard on January 20. Patient here today for preoperative appointment prior to undergoing fiducial marker and SpaceOAR placement on 3/7 in anticipation of beginning primary radiation treatment with XRT.  ? ?He denies any changes in past medical history, prescription medications taken on daily basis, no interval surgical or procedural intervention. He continues doxazosin as well as tamsulosin for management of underlying lower urinary tract symptoms which remain stable. He has had no interval dysuria or  gross hematuria. Denies any recent fevers or chills, nausea/vomiting. Denies any recent chest pain, shortness of breath, numbness or tingling in the extremities or lightheadedness/dizziness. He has tolerated initiation of ADT well. Denies any excessive fatigue or hot flashes from baseline. He continues to enjoy normal daily activities without any significant limitations.  ? ?  ?ALLERGIES: No Allergies ?  ? ?MEDICATIONS: Allopurinol 100 mg tablet  ?Tamsulosin Hcl 0.4 mg capsule  ?Doxazosin Mesylate 4 mg tablet Oral  ?Glipizide 5 mg tablet  ?Kombiglyze Xr 2.5 mg-1,000 mg tablet,extended release multiphase 24 hr Oral  ?Lisinopril 10 mg tablet Oral  ?Vitamin B-12 ER TBCR Oral  ?  ? ?GU PSH: Locm 300-'399Mg'$ /Ml Iodine,1Ml - 04/07/2021 ?Prostate Needle Biopsy - 03/25/2021 ? ?  ?   ?PSH Notes: Hand Incision Tendon Sheath Of A Finger, Appendectomy, Knee Replacement, Rotator Cuff Repair, Back Surgery  ? ?NON-GU PSH: Appendectomy - 2015 ?Incise Finger Tendon Sheath - 2015 ?Revise Knee Joint - 2015 ?Surgical Pathology, Gross And Microscopic Examination For Prostate Needle - 03/25/2021 ? ?  ? ?GU PMH: Prostate Cancer - 05/09/2021, - 04/24/2021, - 04/07/2021, - 04/01/2021 ?Urinary Urgency - 04/24/2021, - 04/01/2021, - 03/17/2021, - 12/19/2020 ?Chronic cystitis (w/o hematuria) - 04/01/2021, - 12/19/2020 ?  ? ?NON-GU PMH: No Non-GU PMH   ? ?FAMILY HISTORY: Death of family member - Runs In Family ?Diabetes - Runs In Family ?Kidney Stones - Runs In Family ?renal failure - Runs In Family  ? ?SOCIAL HISTORY: Marital Status: Divorced ?Ethnicity: Not Hispanic Or Latino; Race: White ?Current Smoking Status: Patient does not smoke anymore.  Has not smoked since 12/19/1980. Smoked for 15 years. Smoked 1 pack per day.  ? ?Tobacco Use Assessment Completed: Used Tobacco in last 30 days? ?Does drink.  ?Does not use drugs. ?Drinks 3 caffeinated drinks per day. ?  ?  Notes: Former smoker, Occupation, Caffeine use, Divorced, Number of children, Alcohol use   ? ?REVIEW OF SYSTEMS:    ?GU Review Male:   Patient reports frequent urination, hard to postpone urination, and get up at night to urinate. Patient denies burning/ pain with urination, leakage of urine, stream starts and stops, trouble starting your stream, have to strain to urinate , erection problems, and penile pain.  ?Gastrointestinal (Upper):   Patient denies nausea, vomiting, and indigestion/ heartburn.  ?Gastrointestinal (Lower):   Patient denies diarrhea and constipation.  ?Constitutional:   Patient reports fatigue. Patient denies fever, night sweats, and weight loss.  ?Skin:   Patient denies skin rash/ lesion and itching.  ?Eyes:   Patient denies blurred vision and double vision.  ?Ears/ Nose/ Throat:   Patient denies sore throat and sinus problems.  ?Hematologic/Lymphatic:   Patient denies swollen glands and easy bruising.  ?Cardiovascular:   Patient denies leg swelling and chest pains.  ?Respiratory:   Patient denies cough and shortness of breath.  ?Endocrine:   Patient denies excessive thirst.  ?Musculoskeletal:   Patient reports joint pain. Patient denies back pain.  ?Neurological:   Patient denies headaches and dizziness.  ?Psychologic:   Patient denies depression and anxiety.  ? ?Notes: Updated from previous visit 12/19/2020 with review from patient as noted above.  ? ?VITAL SIGNS:    ?  06/10/2021 11:10 AM  ?BP 133/73 mmHg  ?Pulse 94 /min  ?Temperature 97.4 F / 36.3 C  ? ?MULTI-SYSTEM PHYSICAL EXAMINATION:    ?Constitutional: Well-nourished. No physical deformities. Normally developed. Good grooming. Very pleasant and attentive.   ?Neck: Neck symmetrical, not swollen. Normal tracheal position.  ?Respiratory: No labored breathing, no use of accessory muscles.   ?Cardiovascular: Normal temperature, normal extremity pulses, no swelling, no varicosities.  ?Skin: No paleness, no jaundice, no cyanosis. No lesion, no ulcer, no rash.  ?Neurologic / Psychiatric: Oriented to time, oriented to place, oriented  to person. No depression, no anxiety, no agitation.  ?Gastrointestinal: Obese abdomen. No mass, no tenderness, no rigidity.   ?Musculoskeletal: Normal gait and station of head and neck.  ? ?  ?Complexity of Data:  ?Source Of History:  Patient, Medical Record Summary  ?Lab Test Review:   PSA  ?Records Review:   Pathology Reports, Previous Doctor Records, Previous Hospital Records, Previous Patient Records  ?Urine Test Review:   Urinalysis, Urine Culture  ? 03/10/21 07/25/13  ?PSA  ?Total PSA 7.13 ng/mL 2.20   ? ? ?PROCEDURES:    ? ?     Urinalysis w/Scope ?Dipstick Dipstick Cont'd Micro  ?Color: Yellow Bilirubin: Neg mg/dL WBC/hpf: 0 - 5/hpf  ?Appearance: Slightly Cloudy Ketones: Neg mg/dL RBC/hpf: NS (Not Seen)  ?Specific Gravity: 1.025 Blood: Neg ery/uL Bacteria: NS (Not Seen)  ?pH: <=5.0 Protein: Neg mg/dL Cystals: NS (Not Seen)  ?Glucose: Neg mg/dL Urobilinogen: 0.2 mg/dL Casts: NS (Not Seen)  ?  Nitrites: Neg Trichomonas: Not Present  ?  Leukocyte Esterase: Neg leu/uL Mucous: Present  ?    Epithelial Cells: 6 - 10/hpf  ?    Yeast: NS (Not Seen)  ?    Sperm: Not Present  ? ? ?ASSESSMENT:  ?    ICD-10 Details  ?1 GU:   Prostate Cancer -  C61 Chronic, Threat to Bodily Function  ?2 NON-GU:   Encounter for other preprocedural examination - Z01.818 Undiagnosed New Problem  ? ?PLAN:    ? ?      Orders ?Labs Urine Culture  ? ? ?      Schedule ?Return Visit/Planned Activity: Keep Scheduled Appointment - Follow up MD, Schedule Surgery  ? ? ?      Document ?Letter(s):  Created for Patient: Clinical Summary  ? ? ?     Notes:   All questions answered to the best of my ability regarding the upcoming procedure and expected postoperative course with understanding expressed by the patient. Precautionary urine culture sent today to serve as a baseline. He will proceed with previously scheduled fiducial marker placement and SpaceOAR on 3/7.  ? ? ?

## 2021-06-24 ENCOUNTER — Encounter (HOSPITAL_BASED_OUTPATIENT_CLINIC_OR_DEPARTMENT_OTHER)
Admission: RE | Disposition: A | Discharge status: Home / Self Care | Payer: Self-pay | Source: Home / Self Care | Attending: Urology

## 2021-06-24 ENCOUNTER — Encounter (HOSPITAL_BASED_OUTPATIENT_CLINIC_OR_DEPARTMENT_OTHER): Payer: Self-pay | Admitting: Urology

## 2021-06-24 ENCOUNTER — Ambulatory Visit (HOSPITAL_BASED_OUTPATIENT_CLINIC_OR_DEPARTMENT_OTHER): Payer: Medicare Other | Admitting: Anesthesiology

## 2021-06-24 ENCOUNTER — Ambulatory Visit (HOSPITAL_BASED_OUTPATIENT_CLINIC_OR_DEPARTMENT_OTHER)
Admission: RE | Admit: 2021-06-24 | Discharge: 2021-06-24 | Disposition: A | Discharge status: Home / Self Care | Payer: Medicare Other | Attending: Urology | Admitting: Urology

## 2021-06-24 DIAGNOSIS — C61 Malignant neoplasm of prostate: Secondary | ICD-10-CM | POA: Insufficient documentation

## 2021-06-24 DIAGNOSIS — Z79899 Other long term (current) drug therapy: Secondary | ICD-10-CM | POA: Insufficient documentation

## 2021-06-24 DIAGNOSIS — E119 Type 2 diabetes mellitus without complications: Secondary | ICD-10-CM | POA: Diagnosis not present

## 2021-06-24 DIAGNOSIS — I10 Essential (primary) hypertension: Secondary | ICD-10-CM | POA: Insufficient documentation

## 2021-06-24 DIAGNOSIS — Z6841 Body Mass Index (BMI) 40.0 and over, adult: Secondary | ICD-10-CM | POA: Diagnosis not present

## 2021-06-24 DIAGNOSIS — R7989 Other specified abnormal findings of blood chemistry: Secondary | ICD-10-CM | POA: Diagnosis not present

## 2021-06-24 DIAGNOSIS — D649 Anemia, unspecified: Secondary | ICD-10-CM | POA: Insufficient documentation

## 2021-06-24 DIAGNOSIS — G4733 Obstructive sleep apnea (adult) (pediatric): Secondary | ICD-10-CM | POA: Diagnosis not present

## 2021-06-24 DIAGNOSIS — Z87440 Personal history of urinary (tract) infections: Secondary | ICD-10-CM | POA: Diagnosis not present

## 2021-06-24 DIAGNOSIS — G4730 Sleep apnea, unspecified: Secondary | ICD-10-CM | POA: Diagnosis not present

## 2021-06-24 DIAGNOSIS — Z7984 Long term (current) use of oral hypoglycemic drugs: Secondary | ICD-10-CM | POA: Diagnosis not present

## 2021-06-24 DIAGNOSIS — Z87891 Personal history of nicotine dependence: Secondary | ICD-10-CM | POA: Insufficient documentation

## 2021-06-24 DIAGNOSIS — Z794 Long term (current) use of insulin: Secondary | ICD-10-CM | POA: Insufficient documentation

## 2021-06-24 HISTORY — PX: GOLD SEED IMPLANT: SHX6343

## 2021-06-24 HISTORY — PX: SPACE OAR INSTILLATION: SHX6769

## 2021-06-24 LAB — POCT I-STAT, CHEM 8
BUN: 33 mg/dL — ABNORMAL HIGH (ref 8–23)
Calcium, Ion: 1.44 mmol/L — ABNORMAL HIGH (ref 1.15–1.40)
Chloride: 107 mmol/L (ref 98–111)
Creatinine, Ser: 1.3 mg/dL — ABNORMAL HIGH (ref 0.61–1.24)
Glucose, Bld: 184 mg/dL — ABNORMAL HIGH (ref 70–99)
HCT: 36 % — ABNORMAL LOW (ref 39.0–52.0)
Hemoglobin: 12.2 g/dL — ABNORMAL LOW (ref 13.0–17.0)
Potassium: 4.8 mmol/L (ref 3.5–5.1)
Sodium: 138 mmol/L (ref 135–145)
TCO2: 19 mmol/L — ABNORMAL LOW (ref 22–32)

## 2021-06-24 LAB — GLUCOSE, CAPILLARY: Glucose-Capillary: 159 mg/dL — ABNORMAL HIGH (ref 70–99)

## 2021-06-24 SURGERY — INSERTION, GOLD SEEDS
Anesthesia: General

## 2021-06-24 MED ORDER — ONDANSETRON HCL 4 MG/2ML IJ SOLN
INTRAMUSCULAR | Status: DC | PRN
  Administered 2021-06-24: 4 mg via INTRAVENOUS

## 2021-06-24 MED ORDER — PROPOFOL 10 MG/ML IV BOLUS
INTRAVENOUS | Status: DC | PRN
  Administered 2021-06-24: 200 mg via INTRAVENOUS

## 2021-06-24 MED ORDER — CEFAZOLIN SODIUM-DEXTROSE 2-4 GM/100ML-% IV SOLN: 2.0000 g | INTRAVENOUS | Status: DC

## 2021-06-24 MED ORDER — LIDOCAINE HCL (CARDIAC) PF 100 MG/5ML IV SOSY
PREFILLED_SYRINGE | INTRAVENOUS | Status: DC | PRN
  Administered 2021-06-24: 60 mg via INTRAVENOUS

## 2021-06-24 MED ORDER — ACETAMINOPHEN 500 MG PO TABS
ORAL_TABLET | ORAL | Status: AC
  Filled 2021-06-24: qty 2

## 2021-06-24 MED ORDER — ACETAMINOPHEN 500 MG PO TABS
1000.0000 mg | ORAL_TABLET | Freq: Once | ORAL | Status: AC
  Administered 2021-06-24: 1000 mg via ORAL

## 2021-06-24 MED ORDER — FLEET ENEMA 7-19 GM/118ML RE ENEM: 1.0000 | ENEMA | Freq: Once | RECTAL | Status: DC

## 2021-06-24 MED ORDER — FENTANYL CITRATE (PF) 100 MCG/2ML IJ SOLN
INTRAMUSCULAR | Status: AC
  Filled 2021-06-24: qty 2

## 2021-06-24 MED ORDER — FENTANYL CITRATE (PF) 100 MCG/2ML IJ SOLN
INTRAMUSCULAR | Status: DC | PRN
Start: 2021-06-24 — End: 2021-06-24
  Administered 2021-06-24: 100 ug via INTRAVENOUS

## 2021-06-24 MED ORDER — CEFAZOLIN IN SODIUM CHLORIDE 3-0.9 GM/100ML-% IV SOLN
3.0000 g | INTRAVENOUS | Status: AC
  Administered 2021-06-24: 3 g via INTRAVENOUS

## 2021-06-24 MED ORDER — SODIUM CHLORIDE (PF) 0.9 % IJ SOLN
INTRAMUSCULAR | Status: DC | PRN
  Administered 2021-06-24: 10 mL

## 2021-06-24 MED ORDER — EPHEDRINE SULFATE-NACL 50-0.9 MG/10ML-% IV SOSY
PREFILLED_SYRINGE | INTRAVENOUS | Status: DC | PRN
  Administered 2021-06-24: 5 mg via INTRAVENOUS
  Administered 2021-06-24: 10 mg via INTRAVENOUS

## 2021-06-24 MED ORDER — LACTATED RINGERS IV SOLN: INTRAVENOUS | Status: DC

## 2021-06-24 MED ORDER — CEFAZOLIN SODIUM-DEXTROSE 2-4 GM/100ML-% IV SOLN
INTRAVENOUS | Status: AC
  Filled 2021-06-24: qty 100

## 2021-06-24 MED ORDER — CEFAZOLIN IN SODIUM CHLORIDE 3-0.9 GM/100ML-% IV SOLN
INTRAVENOUS | Status: AC
  Filled 2021-06-24: qty 100

## 2021-06-24 SURGICAL SUPPLY — 19 items
COVER BACK TABLE 60X90IN (DRAPES) ×2 IMPLANT
DRSG TEGADERM 4X4.75 (GAUZE/BANDAGES/DRESSINGS) ×3 IMPLANT
DRSG TEGADERM 8X12 (GAUZE/BANDAGES/DRESSINGS) ×4 IMPLANT
GAUZE SPONGE 4X4 12PLY STRL LF (GAUZE/BANDAGES/DRESSINGS) ×1 IMPLANT
GLOVE SURG ENC MOIS LTX SZ7.5 (GLOVE) ×2 IMPLANT
GLOVE SURG LTX SZ8 (GLOVE) ×2 IMPLANT
GLOVE SURG ORTHO LTX SZ8.5 (GLOVE) ×2 IMPLANT
GOWN STRL REUS W/TWL XL LVL3 (GOWN DISPOSABLE) ×2 IMPLANT
IMPL SPACEOAR VUE SYSTEM (Spacer) ×1 IMPLANT
IMPLANT SPACEOAR VUE SYSTEM (Spacer) ×2 IMPLANT
KIT TURNOVER CYSTO (KITS) ×2 IMPLANT
MARKER GOLD PRELOAD 1.2X3 (Urological Implant) ×1 IMPLANT
NDL SPNL 22GX7 QUINCKE BK (NEEDLE) IMPLANT
NEEDLE SPNL 22GX7 QUINCKE BK (NEEDLE) IMPLANT
PACK CYSTO (CUSTOM PROCEDURE TRAY) ×2 IMPLANT
SEED GOLD PRELOAD 1.2X3 (Urological Implant) ×2 IMPLANT
SURGILUBE 2OZ TUBE FLIPTOP (MISCELLANEOUS) ×2 IMPLANT
SYR 20ML LL LF (SYRINGE) IMPLANT
UNDERPAD 30X36 HEAVY ABSORB (UNDERPADS AND DIAPERS) ×4 IMPLANT

## 2021-06-24 NOTE — Anesthesia Procedure Notes (Signed)
Procedure Name: LMA Insertion ?Date/Time: 06/24/2021 9:10 AM ?Performed by: Gwyndolyn Saxon, CRNA ?Pre-anesthesia Checklist: Patient identified, Emergency Drugs available, Suction available and Patient being monitored ?Patient Re-evaluated:Patient Re-evaluated prior to induction ?Oxygen Delivery Method: Circle system utilized ?Preoxygenation: Pre-oxygenation with 100% oxygen ?Induction Type: IV induction ?Ventilation: Mask ventilation with difficulty ?LMA: LMA inserted ?LMA Size: 5.0 ?Number of attempts: 1 ?Airway Equipment and Method: Patient positioned with wedge pillow ?Placement Confirmation: positive ETCO2 and breath sounds checked- equal and bilateral ?Tube secured with: Tape ?Dental Injury: Teeth and Oropharynx as per pre-operative assessment  ? ? ? ? ?

## 2021-06-24 NOTE — Op Note (Addendum)
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 ?Postoperative diagnosis: Clinically localized adenocarcinoma of the prostate ? ?Procedure: 1) Placement of fiducial markers into prostate ?                   2) Insertion of SpaceOAR hydrogel  ? ?Surgeon: Milford Cage ? ?Radiation oncologist: Tammi Klippel ? ?Anesthesia: General ? ?EBL: Minimal ? ?Complications: None ? ?Indication: Gregory Estrada is a 76 y.o. gentleman with clinically localized prostate cancer. After discussing management options for treatment, he elected to proceed with radiotherapy. He presents today for the above procedures. The potential risks, complications, alternative options, and expected recovery course have been discussed in detail with the patient and he has provided informed consent to proceed. ? ?Description of procedure: The patient was administered preoperative antibiotics, placed in the dorsal lithotomy position, and prepped and draped in the usual sterile fashion. Next, transrectal ultrasonography was utilized to visualize the prostate. Three gold fiducial markers were then placed into the prostate via transperineal needles under ultrasound guidance at the rightapex, right base, and leftmid gland under direct ultrasound guidance. A site in the midline was then selected on the perineum for placement of an 18 g needle with saline. The needle was advanced above the rectum and below Denonvillier's fascia to the mid gland and confirmed to be in the midline on transverse imaging. One cc of saline was injected confirming appropriate expansion of this space. A total of 5 cc of saline was then injected to open the space further bilaterally. The saline syringe was then removed and the SpaceOAR hydrogel was injected with good distribution bilaterally. He tolerated the procedure well and without complications. He was given a voiding trial prior to discharge from the PACU.  ?

## 2021-06-24 NOTE — Anesthesia Preprocedure Evaluation (Addendum)
Anesthesia Evaluation  ?Patient identified by MRN, date of birth, ID band ?Patient awake ? ? ? ?Reviewed: ?Allergy & Precautions, H&P , NPO status , Patient's Chart, lab work & pertinent test results ? ?Airway ?Mallampati: III ? ?TM Distance: >3 FB ?Neck ROM: Full ? ? ? Dental ?no notable dental hx. ?(+) Teeth Intact, Dental Advisory Given ?  ?Pulmonary ?sleep apnea , former smoker,  ?  ?Pulmonary exam normal ?breath sounds clear to auscultation ? ? ? ? ? ? Cardiovascular ?hypertension, Pt. on medications ? ?Rhythm:Regular Rate:Normal ? ? ?  ?Neuro/Psych ?negative neurological ROS ? negative psych ROS  ? GI/Hepatic ?Neg liver ROS, GERD  ,  ?Endo/Other  ?diabetes, Insulin Dependent, Oral Hypoglycemic AgentsMorbid obesity ? Renal/GU ?Renal disease  ?negative genitourinary ?  ?Musculoskeletal ? ?(+) Arthritis , Osteoarthritis,   ? Abdominal ?  ?Peds ? Hematology ? ?(+) Blood dyscrasia, anemia ,   ?Anesthesia Other Findings ? ? Reproductive/Obstetrics ?negative OB ROS ? ?  ? ? ? ? ? ? ? ? ? ? ? ? ? ?  ?  ? ? ? ? ? ? ? ?Anesthesia Physical ?Anesthesia Plan ? ?ASA: 3 ? ?Anesthesia Plan: General  ? ?Post-op Pain Management: Tylenol PO (pre-op)* and Minimal or no pain anticipated  ? ?Induction: Intravenous ? ?PONV Risk Score and Plan: 2 and Propofol infusion and Ondansetron ? ?Airway Management Planned: LMA ? ?Additional Equipment:  ? ?Intra-op Plan:  ? ?Post-operative Plan: Extubation in OR ? ?Informed Consent: I have reviewed the patients History and Physical, chart, labs and discussed the procedure including the risks, benefits and alternatives for the proposed anesthesia with the patient or authorized representative who has indicated his/her understanding and acceptance.  ? ? ? ?Dental advisory given ? ?Plan Discussed with: CRNA ? ?Anesthesia Plan Comments:   ? ? ? ? ? ?Anesthesia Quick Evaluation ? ?

## 2021-06-24 NOTE — Discharge Instructions (Signed)
?  Post Anesthesia Home Care Instructions ? ?Activity: ?Get plenty of rest for the remainder of the day. A responsible individual must stay with you for 24 hours following the procedure.  ?For the next 24 hours, DO NOT: ?-Drive a car ?-Paediatric nurse ?-Drink alcoholic beverages ?-Take any medication unless instructed by your physician ?-Make any legal decisions or sign important papers. ? ?Meals: ?Start with liquid foods such as gelatin or soup. Progress to regular foods as tolerated. Avoid greasy, spicy, heavy foods. If nausea and/or vomiting occur, drink only clear liquids until the nausea and/or vomiting subsides. Call your physician if vomiting continues. ? ?Special Instructions/Symptoms: ?Your throat may feel dry or sore from the anesthesia or the breathing tube placed in your throat during surgery. If this causes discomfort, gargle with warm salt water. The discomfort should disappear within 24 hours. ? ?Call your surgeon if you experience:  ? ?1.  Fever over 101.0. ?2.  Inability to urinate. ?3.  Nausea and/or vomiting. ?4.  Extreme swelling or bruising at the surgical site. ?5.  Continued bleeding from the incision. ?6.  Increased pain, redness or drainage from the incision. ?7.  Problems related to your pain medication. ?8.  Any problems and/or concerns . ?9. May remove dressing tomorrow. ?    ?

## 2021-06-24 NOTE — Interval H&P Note (Signed)
History and Physical Interval Note: ? ?06/24/2021 ?8:05 AM ? ?Gregory Estrada  has presented today for surgery, with the diagnosis of PROSTATE CANCER.  The various methods of treatment have been discussed with the patient and family. After consideration of risks, benefits and other options for treatment, the patient has consented to  Procedure(s): ?GOLD SEED IMPLANT (N/A) ?SPACE OAR INSTILLATION (N/A) as a surgical intervention.  The patient's history has been reviewed, patient examined, no change in status, stable for surgery.  I have reviewed the patient's chart and labs.  Questions were answered to the patient's satisfaction.   ? ? ?Gregory Estrada ? ? ?

## 2021-06-24 NOTE — Anesthesia Postprocedure Evaluation (Signed)
Anesthesia Post Note ? ?Patient: Gregory Estrada ? ?Procedure(s) Performed: GOLD SEED IMPLANT ?SPACE OAR INSTILLATION ? ?  ? ?Patient location during evaluation: PACU ?Anesthesia Type: General ?Level of consciousness: awake and alert ?Pain management: pain level controlled ?Vital Signs Assessment: post-procedure vital signs reviewed and stable ?Respiratory status: spontaneous breathing, nonlabored ventilation and respiratory function stable ?Cardiovascular status: blood pressure returned to baseline and stable ?Postop Assessment: no apparent nausea or vomiting ?Anesthetic complications: no ? ? ?No notable events documented. ? ?Last Vitals:  ?Vitals:  ? 06/24/21 1015 06/24/21 1025  ?BP: 132/66 (!) 139/54  ?Pulse: 72 73  ?Resp: 19 14  ?Temp:  (!) 36.4 ?C  ?SpO2: 95% 99%  ?  ?Last Pain:  ?Vitals:  ? 06/24/21 1025  ?TempSrc:   ?PainSc: 0-No pain  ? ? ?  ?  ?  ?  ?  ?  ? ?[name],W. EDMOND ? ? ? ? ?

## 2021-06-24 NOTE — Transfer of Care (Signed)
Immediate Anesthesia Transfer of Care Note ? ?Patient: Gregory Estrada ? ?Procedure(s) Performed: GOLD SEED IMPLANT ?SPACE OAR INSTILLATION ? ?Patient Location: PACU ? ?Anesthesia Type:General ? ?Level of Consciousness: drowsy and patient cooperative ? ?Airway & Oxygen Therapy: Patient Spontanous Breathing and Patient connected to face mask oxygen ? ?Post-op Assessment: Report given to RN and Post -op Vital signs reviewed and stable ? ?Post vital signs: Reviewed and stable ? ?Last Vitals:  ?Vitals Value Taken Time  ?BP 120/58 06/24/21 0940  ?Temp    ?Pulse 79 06/24/21 0940  ?Resp 23 06/24/21 0940  ?SpO2 97 % 06/24/21 0940  ?Vitals shown include unvalidated device data. ? ?Last Pain:  ?Vitals:  ? 06/24/21 0811  ?TempSrc: Oral  ?PainSc: 0-No pain  ?   ? ?Patients Stated Pain Goal: 7 (06/24/21 9628) ? ?Complications: No notable events documented. ?

## 2021-06-25 ENCOUNTER — Encounter (HOSPITAL_BASED_OUTPATIENT_CLINIC_OR_DEPARTMENT_OTHER): Payer: Self-pay | Admitting: Urology

## 2021-06-25 DIAGNOSIS — E538 Deficiency of other specified B group vitamins: Secondary | ICD-10-CM | POA: Diagnosis not present

## 2021-06-30 ENCOUNTER — Ambulatory Visit: Payer: Medicare Other | Admitting: Radiation Oncology

## 2021-06-30 ENCOUNTER — Telehealth: Payer: Self-pay | Admitting: *Deleted

## 2021-06-30 NOTE — Telephone Encounter (Signed)
Called patient to remind of sim appt. for 07-01-21- arrival time- 12:45 pm @ [place], patient informed to arrive with a full bladder and an empty bowel, patient verified understanding this appt. and the instructions ?

## 2021-07-01 ENCOUNTER — Other Ambulatory Visit: Payer: Self-pay

## 2021-07-01 ENCOUNTER — Ambulatory Visit
Admission: RE | Admit: 2021-07-01 | Discharge: 2021-07-01 | Disposition: A | Discharge status: Home / Self Care | Payer: Medicare Other | Source: Ambulatory Visit | Attending: Radiation Oncology | Admitting: Radiation Oncology

## 2021-07-01 DIAGNOSIS — Z510 Encounter for antineoplastic radiation therapy: Secondary | ICD-10-CM | POA: Insufficient documentation

## 2021-07-01 DIAGNOSIS — C61 Malignant neoplasm of prostate: Secondary | ICD-10-CM | POA: Insufficient documentation

## 2021-07-01 DIAGNOSIS — Z191 Hormone sensitive malignancy status: Secondary | ICD-10-CM | POA: Diagnosis not present

## 2021-07-01 NOTE — Progress Notes (Signed)
?  Radiation Oncology         [phone] ?________________________________ ? ?Name: Gregory Estrada MRN: [id number]  ?Date: 07/01/2021  DOB: [birth year] ? ?SIMULATION AND TREATMENT PLANNING NOTE ? ?  ICD-10-CM   ?1. Malignant neoplasm of prostate ([place])  C61   ?  ? ? ?DIAGNOSIS:  76 y.o. gentleman with Stage T1c adenocarcinoma of the prostate with Gleason score of 4+4, and PSA of 7.13. ? ?NARRATIVE:  The patient was brought to the [facility].  Identity was confirmed.  All relevant records and images related to the planned course of therapy were reviewed.  The patient freely provided informed written consent to proceed with treatment after reviewing the details related to the planned course of therapy. The consent form was witnessed and verified by the simulation staff.  Then, the patient was set-up in a stable reproducible supine position for radiation therapy.  A vacuum lock pillow device was custom fabricated to position his legs in a reproducible immobilized position.  Then, I performed a urethrogram under sterile conditions to identify the prostatic bed.  CT images were obtained.  Surface markings were placed.  The CT images were loaded into the planning software.  Then the prostate bed target, pelvic lymph node target and avoidance structures including the rectum, bladder, bowel and hips were contoured.  Treatment planning then occurred.  The radiation prescription was entered and confirmed.  A total of one complex treatment devices were fabricated. I have requested : Intensity Modulated Radiotherapy (IMRT) is medically necessary for this case for the following reason:  Rectal sparing.. ? ?PLAN:  The patient will receive 45 Gy in 25 fractions of 1.8 Gy, followed by a boost to the prostate to a total dose of 75 Gy with 15 additional fractions of 2 Gy. ? ? ?________________________________ ? ?Sheral Apley Tammi Klippel, M.D. ? ?

## 2021-07-02 DIAGNOSIS — Z510 Encounter for antineoplastic radiation therapy: Secondary | ICD-10-CM | POA: Diagnosis not present

## 2021-07-02 DIAGNOSIS — Z191 Hormone sensitive malignancy status: Secondary | ICD-10-CM | POA: Diagnosis not present

## 2021-07-02 DIAGNOSIS — C61 Malignant neoplasm of prostate: Secondary | ICD-10-CM | POA: Diagnosis not present

## 2021-07-10 ENCOUNTER — Ambulatory Visit
Admission: RE | Admit: 2021-07-10 | Discharge: 2021-07-10 | Disposition: A | Discharge status: Home / Self Care | Payer: Medicare Other | Source: Ambulatory Visit | Attending: Radiation Oncology | Admitting: Radiation Oncology

## 2021-07-10 DIAGNOSIS — Z510 Encounter for antineoplastic radiation therapy: Secondary | ICD-10-CM | POA: Diagnosis not present

## 2021-07-10 DIAGNOSIS — Z191 Hormone sensitive malignancy status: Secondary | ICD-10-CM | POA: Diagnosis not present

## 2021-07-10 DIAGNOSIS — C61 Malignant neoplasm of prostate: Secondary | ICD-10-CM | POA: Diagnosis not present

## 2021-07-11 ENCOUNTER — Other Ambulatory Visit: Payer: Self-pay

## 2021-07-11 ENCOUNTER — Ambulatory Visit
Admission: RE | Admit: 2021-07-11 | Discharge: 2021-07-11 | Disposition: A | Discharge status: Home / Self Care | Payer: Medicare Other | Source: Ambulatory Visit | Attending: Radiation Oncology | Admitting: Radiation Oncology

## 2021-07-11 DIAGNOSIS — C61 Malignant neoplasm of prostate: Secondary | ICD-10-CM | POA: Diagnosis not present

## 2021-07-11 DIAGNOSIS — Z191 Hormone sensitive malignancy status: Secondary | ICD-10-CM | POA: Diagnosis not present

## 2021-07-11 DIAGNOSIS — Z510 Encounter for antineoplastic radiation therapy: Secondary | ICD-10-CM | POA: Diagnosis not present

## 2021-07-14 ENCOUNTER — Other Ambulatory Visit: Payer: Self-pay

## 2021-07-14 ENCOUNTER — Ambulatory Visit
Admission: RE | Admit: 2021-07-14 | Discharge: 2021-07-14 | Disposition: A | Discharge status: Home / Self Care | Payer: Medicare Other | Source: Ambulatory Visit | Attending: Radiation Oncology | Admitting: Radiation Oncology

## 2021-07-14 DIAGNOSIS — Z510 Encounter for antineoplastic radiation therapy: Secondary | ICD-10-CM | POA: Diagnosis not present

## 2021-07-14 DIAGNOSIS — Z191 Hormone sensitive malignancy status: Secondary | ICD-10-CM | POA: Diagnosis not present

## 2021-07-14 DIAGNOSIS — C61 Malignant neoplasm of prostate: Secondary | ICD-10-CM | POA: Diagnosis not present

## 2021-07-15 ENCOUNTER — Ambulatory Visit
Admission: RE | Admit: 2021-07-15 | Discharge: 2021-07-15 | Disposition: A | Discharge status: Home / Self Care | Payer: Medicare Other | Source: Ambulatory Visit | Attending: Radiation Oncology | Admitting: Radiation Oncology

## 2021-07-15 DIAGNOSIS — Z191 Hormone sensitive malignancy status: Secondary | ICD-10-CM | POA: Diagnosis not present

## 2021-07-15 DIAGNOSIS — Z510 Encounter for antineoplastic radiation therapy: Secondary | ICD-10-CM | POA: Diagnosis not present

## 2021-07-15 DIAGNOSIS — C61 Malignant neoplasm of prostate: Secondary | ICD-10-CM | POA: Diagnosis not present

## 2021-07-16 ENCOUNTER — Other Ambulatory Visit: Payer: Self-pay

## 2021-07-16 ENCOUNTER — Ambulatory Visit
Admission: RE | Admit: 2021-07-16 | Discharge: 2021-07-16 | Disposition: A | Discharge status: Home / Self Care | Payer: Medicare Other | Source: Ambulatory Visit | Attending: Radiation Oncology | Admitting: Radiation Oncology

## 2021-07-16 DIAGNOSIS — C61 Malignant neoplasm of prostate: Secondary | ICD-10-CM | POA: Diagnosis not present

## 2021-07-16 DIAGNOSIS — Z191 Hormone sensitive malignancy status: Secondary | ICD-10-CM | POA: Diagnosis not present

## 2021-07-16 DIAGNOSIS — Z510 Encounter for antineoplastic radiation therapy: Secondary | ICD-10-CM | POA: Diagnosis not present

## 2021-07-17 ENCOUNTER — Ambulatory Visit
Admission: RE | Admit: 2021-07-17 | Discharge: 2021-07-17 | Disposition: A | Discharge status: Home / Self Care | Payer: Medicare Other | Source: Ambulatory Visit | Attending: Radiation Oncology | Admitting: Radiation Oncology

## 2021-07-17 DIAGNOSIS — C61 Malignant neoplasm of prostate: Secondary | ICD-10-CM | POA: Diagnosis not present

## 2021-07-17 DIAGNOSIS — Z191 Hormone sensitive malignancy status: Secondary | ICD-10-CM | POA: Diagnosis not present

## 2021-07-17 DIAGNOSIS — Z510 Encounter for antineoplastic radiation therapy: Secondary | ICD-10-CM | POA: Diagnosis not present

## 2021-07-18 ENCOUNTER — Other Ambulatory Visit: Payer: Self-pay

## 2021-07-18 ENCOUNTER — Ambulatory Visit
Admission: RE | Admit: 2021-07-18 | Discharge: 2021-07-18 | Disposition: A | Discharge status: Home / Self Care | Payer: Medicare Other | Source: Ambulatory Visit | Attending: Radiation Oncology | Admitting: Radiation Oncology

## 2021-07-18 DIAGNOSIS — Z191 Hormone sensitive malignancy status: Secondary | ICD-10-CM | POA: Diagnosis not present

## 2021-07-18 DIAGNOSIS — C61 Malignant neoplasm of prostate: Secondary | ICD-10-CM | POA: Diagnosis not present

## 2021-07-18 DIAGNOSIS — Z510 Encounter for antineoplastic radiation therapy: Secondary | ICD-10-CM | POA: Diagnosis not present

## 2021-07-21 ENCOUNTER — Ambulatory Visit
Admission: RE | Admit: 2021-07-21 | Discharge: 2021-07-21 | Disposition: A | Discharge status: Home / Self Care | Payer: Medicare Other | Source: Ambulatory Visit | Attending: Radiation Oncology | Admitting: Radiation Oncology

## 2021-07-21 ENCOUNTER — Other Ambulatory Visit: Payer: Self-pay

## 2021-07-21 DIAGNOSIS — Z510 Encounter for antineoplastic radiation therapy: Secondary | ICD-10-CM | POA: Diagnosis not present

## 2021-07-21 DIAGNOSIS — C61 Malignant neoplasm of prostate: Secondary | ICD-10-CM | POA: Insufficient documentation

## 2021-07-21 DIAGNOSIS — K760 Fatty (change of) liver, not elsewhere classified: Secondary | ICD-10-CM | POA: Insufficient documentation

## 2021-07-21 DIAGNOSIS — Z191 Hormone sensitive malignancy status: Secondary | ICD-10-CM | POA: Diagnosis not present

## 2021-07-22 ENCOUNTER — Ambulatory Visit
Admission: RE | Admit: 2021-07-22 | Discharge: 2021-07-22 | Disposition: A | Discharge status: Home / Self Care | Payer: Medicare Other | Source: Ambulatory Visit | Attending: Radiation Oncology | Admitting: Radiation Oncology

## 2021-07-22 DIAGNOSIS — C61 Malignant neoplasm of prostate: Secondary | ICD-10-CM | POA: Diagnosis not present

## 2021-07-22 DIAGNOSIS — Z510 Encounter for antineoplastic radiation therapy: Secondary | ICD-10-CM | POA: Diagnosis not present

## 2021-07-22 DIAGNOSIS — Z191 Hormone sensitive malignancy status: Secondary | ICD-10-CM | POA: Diagnosis not present

## 2021-07-22 DIAGNOSIS — K760 Fatty (change of) liver, not elsewhere classified: Secondary | ICD-10-CM | POA: Diagnosis not present

## 2021-07-23 ENCOUNTER — Ambulatory Visit
Admission: RE | Admit: 2021-07-23 | Discharge: 2021-07-23 | Disposition: A | Discharge status: Home / Self Care | Payer: Medicare Other | Source: Ambulatory Visit | Attending: Radiation Oncology | Admitting: Radiation Oncology

## 2021-07-23 ENCOUNTER — Other Ambulatory Visit: Payer: Self-pay

## 2021-07-23 DIAGNOSIS — C61 Malignant neoplasm of prostate: Secondary | ICD-10-CM | POA: Diagnosis not present

## 2021-07-23 DIAGNOSIS — Z510 Encounter for antineoplastic radiation therapy: Secondary | ICD-10-CM | POA: Diagnosis not present

## 2021-07-23 DIAGNOSIS — K760 Fatty (change of) liver, not elsewhere classified: Secondary | ICD-10-CM | POA: Diagnosis not present

## 2021-07-23 DIAGNOSIS — Z191 Hormone sensitive malignancy status: Secondary | ICD-10-CM | POA: Diagnosis not present

## 2021-07-24 ENCOUNTER — Ambulatory Visit
Admission: RE | Admit: 2021-07-24 | Discharge: 2021-07-24 | Disposition: A | Discharge status: Home / Self Care | Payer: Medicare Other | Source: Ambulatory Visit | Attending: Radiation Oncology | Admitting: Radiation Oncology

## 2021-07-24 DIAGNOSIS — Z191 Hormone sensitive malignancy status: Secondary | ICD-10-CM | POA: Diagnosis not present

## 2021-07-24 DIAGNOSIS — Z510 Encounter for antineoplastic radiation therapy: Secondary | ICD-10-CM | POA: Diagnosis not present

## 2021-07-24 DIAGNOSIS — C61 Malignant neoplasm of prostate: Secondary | ICD-10-CM

## 2021-07-24 DIAGNOSIS — K760 Fatty (change of) liver, not elsewhere classified: Secondary | ICD-10-CM | POA: Diagnosis not present

## 2021-07-24 MED ORDER — SONAFINE EX EMUL
1.0000 "application " | Freq: Once | CUTANEOUS | Status: AC
  Administered 2021-07-24: 1 via TOPICAL

## 2021-07-25 ENCOUNTER — Other Ambulatory Visit: Payer: Self-pay

## 2021-07-25 ENCOUNTER — Ambulatory Visit
Admission: RE | Admit: 2021-07-25 | Discharge: 2021-07-25 | Disposition: A | Discharge status: Home / Self Care | Payer: Medicare Other | Source: Ambulatory Visit | Attending: Radiation Oncology | Admitting: Radiation Oncology

## 2021-07-25 DIAGNOSIS — Z191 Hormone sensitive malignancy status: Secondary | ICD-10-CM | POA: Diagnosis not present

## 2021-07-25 DIAGNOSIS — K760 Fatty (change of) liver, not elsewhere classified: Secondary | ICD-10-CM | POA: Diagnosis not present

## 2021-07-25 DIAGNOSIS — C61 Malignant neoplasm of prostate: Secondary | ICD-10-CM | POA: Diagnosis not present

## 2021-07-25 DIAGNOSIS — Z510 Encounter for antineoplastic radiation therapy: Secondary | ICD-10-CM | POA: Diagnosis not present

## 2021-07-28 ENCOUNTER — Other Ambulatory Visit: Payer: Self-pay

## 2021-07-28 ENCOUNTER — Ambulatory Visit
Admission: RE | Admit: 2021-07-28 | Discharge: 2021-07-28 | Disposition: A | Discharge status: Home / Self Care | Payer: Medicare Other | Source: Ambulatory Visit | Attending: Radiation Oncology | Admitting: Radiation Oncology

## 2021-07-28 DIAGNOSIS — C61 Malignant neoplasm of prostate: Secondary | ICD-10-CM | POA: Diagnosis not present

## 2021-07-28 DIAGNOSIS — Z510 Encounter for antineoplastic radiation therapy: Secondary | ICD-10-CM | POA: Diagnosis not present

## 2021-07-28 DIAGNOSIS — K760 Fatty (change of) liver, not elsewhere classified: Secondary | ICD-10-CM | POA: Diagnosis not present

## 2021-07-28 DIAGNOSIS — Z191 Hormone sensitive malignancy status: Secondary | ICD-10-CM | POA: Diagnosis not present

## 2021-07-29 ENCOUNTER — Ambulatory Visit
Admission: RE | Admit: 2021-07-29 | Discharge: 2021-07-29 | Disposition: A | Discharge status: Home / Self Care | Payer: Medicare Other | Source: Ambulatory Visit | Attending: Radiation Oncology | Admitting: Radiation Oncology

## 2021-07-29 DIAGNOSIS — Z510 Encounter for antineoplastic radiation therapy: Secondary | ICD-10-CM | POA: Diagnosis not present

## 2021-07-29 DIAGNOSIS — K760 Fatty (change of) liver, not elsewhere classified: Secondary | ICD-10-CM | POA: Diagnosis not present

## 2021-07-29 DIAGNOSIS — Z191 Hormone sensitive malignancy status: Secondary | ICD-10-CM | POA: Diagnosis not present

## 2021-07-29 DIAGNOSIS — C61 Malignant neoplasm of prostate: Secondary | ICD-10-CM | POA: Diagnosis not present

## 2021-07-29 DIAGNOSIS — E119 Type 2 diabetes mellitus without complications: Secondary | ICD-10-CM | POA: Diagnosis not present

## 2021-07-30 ENCOUNTER — Other Ambulatory Visit: Payer: Self-pay

## 2021-07-30 ENCOUNTER — Ambulatory Visit
Admission: RE | Admit: 2021-07-30 | Discharge: 2021-07-30 | Disposition: A | Discharge status: Home / Self Care | Payer: Medicare Other | Source: Ambulatory Visit | Attending: Radiation Oncology | Admitting: Radiation Oncology

## 2021-07-30 DIAGNOSIS — K760 Fatty (change of) liver, not elsewhere classified: Secondary | ICD-10-CM | POA: Diagnosis not present

## 2021-07-30 DIAGNOSIS — C61 Malignant neoplasm of prostate: Secondary | ICD-10-CM | POA: Diagnosis not present

## 2021-07-30 DIAGNOSIS — Z191 Hormone sensitive malignancy status: Secondary | ICD-10-CM | POA: Diagnosis not present

## 2021-07-30 DIAGNOSIS — Z510 Encounter for antineoplastic radiation therapy: Secondary | ICD-10-CM | POA: Diagnosis not present

## 2021-07-31 ENCOUNTER — Ambulatory Visit
Admission: RE | Admit: 2021-07-31 | Discharge: 2021-07-31 | Disposition: A | Discharge status: Home / Self Care | Payer: Medicare Other | Source: Ambulatory Visit | Attending: Radiation Oncology | Admitting: Radiation Oncology

## 2021-07-31 DIAGNOSIS — Z191 Hormone sensitive malignancy status: Secondary | ICD-10-CM | POA: Diagnosis not present

## 2021-07-31 DIAGNOSIS — K760 Fatty (change of) liver, not elsewhere classified: Secondary | ICD-10-CM | POA: Diagnosis not present

## 2021-07-31 DIAGNOSIS — C61 Malignant neoplasm of prostate: Secondary | ICD-10-CM | POA: Diagnosis not present

## 2021-07-31 DIAGNOSIS — Z510 Encounter for antineoplastic radiation therapy: Secondary | ICD-10-CM | POA: Diagnosis not present

## 2021-08-01 ENCOUNTER — Ambulatory Visit
Admission: RE | Admit: 2021-08-01 | Discharge: 2021-08-01 | Disposition: A | Discharge status: Home / Self Care | Payer: Medicare Other | Source: Ambulatory Visit | Attending: Radiation Oncology | Admitting: Radiation Oncology

## 2021-08-01 ENCOUNTER — Other Ambulatory Visit: Payer: Self-pay

## 2021-08-01 DIAGNOSIS — Z191 Hormone sensitive malignancy status: Secondary | ICD-10-CM | POA: Diagnosis not present

## 2021-08-01 DIAGNOSIS — K760 Fatty (change of) liver, not elsewhere classified: Secondary | ICD-10-CM | POA: Diagnosis not present

## 2021-08-01 DIAGNOSIS — Z510 Encounter for antineoplastic radiation therapy: Secondary | ICD-10-CM | POA: Diagnosis not present

## 2021-08-01 DIAGNOSIS — C61 Malignant neoplasm of prostate: Secondary | ICD-10-CM | POA: Diagnosis not present

## 2021-08-04 ENCOUNTER — Other Ambulatory Visit: Payer: Self-pay

## 2021-08-04 ENCOUNTER — Ambulatory Visit
Admission: RE | Admit: 2021-08-04 | Discharge: 2021-08-04 | Disposition: A | Discharge status: Home / Self Care | Payer: Medicare Other | Source: Ambulatory Visit | Attending: Radiation Oncology | Admitting: Radiation Oncology

## 2021-08-04 DIAGNOSIS — C61 Malignant neoplasm of prostate: Secondary | ICD-10-CM | POA: Diagnosis not present

## 2021-08-04 DIAGNOSIS — K760 Fatty (change of) liver, not elsewhere classified: Secondary | ICD-10-CM | POA: Diagnosis not present

## 2021-08-04 DIAGNOSIS — Z510 Encounter for antineoplastic radiation therapy: Secondary | ICD-10-CM | POA: Diagnosis not present

## 2021-08-04 DIAGNOSIS — Z191 Hormone sensitive malignancy status: Secondary | ICD-10-CM | POA: Diagnosis not present

## 2021-08-05 ENCOUNTER — Ambulatory Visit
Admission: RE | Admit: 2021-08-05 | Discharge: 2021-08-05 | Disposition: A | Discharge status: Home / Self Care | Payer: Medicare Other | Source: Ambulatory Visit | Attending: Radiation Oncology | Admitting: Radiation Oncology

## 2021-08-05 ENCOUNTER — Other Ambulatory Visit: Payer: Self-pay

## 2021-08-05 DIAGNOSIS — Z191 Hormone sensitive malignancy status: Secondary | ICD-10-CM | POA: Diagnosis not present

## 2021-08-05 DIAGNOSIS — Z510 Encounter for antineoplastic radiation therapy: Secondary | ICD-10-CM | POA: Diagnosis not present

## 2021-08-05 DIAGNOSIS — K760 Fatty (change of) liver, not elsewhere classified: Secondary | ICD-10-CM | POA: Diagnosis not present

## 2021-08-05 DIAGNOSIS — C61 Malignant neoplasm of prostate: Secondary | ICD-10-CM | POA: Diagnosis not present

## 2021-08-05 LAB — RAD ONC ARIA SESSION SUMMARY
Course Elapsed Days: 26
Plan Fractions Treated to Date: 19
Plan Prescribed Dose Per Fraction: 1.8 Gy
Plan Total Fractions Prescribed: 25
Plan Total Prescribed Dose: 45 Gy
Reference Point Dosage Given to Date: 34.2 Gy
Reference Point Session Dosage Given: 1.8 Gy
Session Number: 19

## 2021-08-06 ENCOUNTER — Other Ambulatory Visit: Payer: Self-pay

## 2021-08-06 ENCOUNTER — Ambulatory Visit
Admission: RE | Admit: 2021-08-06 | Discharge: 2021-08-06 | Disposition: A | Discharge status: Home / Self Care | Payer: Medicare Other | Source: Ambulatory Visit | Attending: Radiation Oncology | Admitting: Radiation Oncology

## 2021-08-06 DIAGNOSIS — C61 Malignant neoplasm of prostate: Secondary | ICD-10-CM | POA: Diagnosis not present

## 2021-08-06 DIAGNOSIS — Z191 Hormone sensitive malignancy status: Secondary | ICD-10-CM | POA: Diagnosis not present

## 2021-08-06 DIAGNOSIS — Z510 Encounter for antineoplastic radiation therapy: Secondary | ICD-10-CM | POA: Diagnosis not present

## 2021-08-06 DIAGNOSIS — K760 Fatty (change of) liver, not elsewhere classified: Secondary | ICD-10-CM | POA: Diagnosis not present

## 2021-08-06 LAB — RAD ONC ARIA SESSION SUMMARY
Course Elapsed Days: 27
Plan Fractions Treated to Date: 20
Plan Prescribed Dose Per Fraction: 1.8 Gy
Plan Total Fractions Prescribed: 25
Plan Total Prescribed Dose: 45 Gy
Reference Point Dosage Given to Date: 36 Gy
Reference Point Session Dosage Given: 1.8 Gy
Session Number: 20

## 2021-08-07 ENCOUNTER — Ambulatory Visit
Admission: RE | Admit: 2021-08-07 | Discharge: 2021-08-07 | Disposition: A | Discharge status: Home / Self Care | Payer: Medicare Other | Source: Ambulatory Visit | Attending: Radiation Oncology | Admitting: Radiation Oncology

## 2021-08-07 ENCOUNTER — Other Ambulatory Visit: Payer: Self-pay

## 2021-08-07 DIAGNOSIS — Z510 Encounter for antineoplastic radiation therapy: Secondary | ICD-10-CM | POA: Diagnosis not present

## 2021-08-07 DIAGNOSIS — K760 Fatty (change of) liver, not elsewhere classified: Secondary | ICD-10-CM | POA: Diagnosis not present

## 2021-08-07 DIAGNOSIS — Z191 Hormone sensitive malignancy status: Secondary | ICD-10-CM | POA: Diagnosis not present

## 2021-08-07 DIAGNOSIS — C61 Malignant neoplasm of prostate: Secondary | ICD-10-CM | POA: Diagnosis not present

## 2021-08-07 LAB — RAD ONC ARIA SESSION SUMMARY
Course Elapsed Days: 28
Plan Fractions Treated to Date: 21
Plan Prescribed Dose Per Fraction: 1.8 Gy
Plan Total Fractions Prescribed: 25
Plan Total Prescribed Dose: 45 Gy
Reference Point Dosage Given to Date: 37.8 Gy
Reference Point Session Dosage Given: 1.8 Gy
Session Number: 21

## 2021-08-08 ENCOUNTER — Other Ambulatory Visit: Payer: Self-pay | Admitting: Radiation Oncology

## 2021-08-08 ENCOUNTER — Ambulatory Visit
Admission: RE | Admit: 2021-08-08 | Discharge: 2021-08-08 | Disposition: A | Discharge status: Home / Self Care | Payer: Medicare Other | Source: Ambulatory Visit | Attending: Radiation Oncology | Admitting: Radiation Oncology

## 2021-08-08 ENCOUNTER — Other Ambulatory Visit: Payer: Self-pay

## 2021-08-08 DIAGNOSIS — Z191 Hormone sensitive malignancy status: Secondary | ICD-10-CM | POA: Diagnosis not present

## 2021-08-08 DIAGNOSIS — C61 Malignant neoplasm of prostate: Secondary | ICD-10-CM | POA: Diagnosis not present

## 2021-08-08 DIAGNOSIS — Z510 Encounter for antineoplastic radiation therapy: Secondary | ICD-10-CM | POA: Diagnosis not present

## 2021-08-08 DIAGNOSIS — K760 Fatty (change of) liver, not elsewhere classified: Secondary | ICD-10-CM | POA: Diagnosis not present

## 2021-08-08 LAB — RAD ONC ARIA SESSION SUMMARY
Course Elapsed Days: 29
Plan Fractions Treated to Date: 22
Plan Prescribed Dose Per Fraction: 1.8 Gy
Plan Total Fractions Prescribed: 25
Plan Total Prescribed Dose: 45 Gy
Reference Point Dosage Given to Date: 39.6 Gy
Reference Point Session Dosage Given: 1.8 Gy
Session Number: 22

## 2021-08-11 ENCOUNTER — Ambulatory Visit
Admission: RE | Admit: 2021-08-11 | Discharge: 2021-08-11 | Disposition: A | Discharge status: Home / Self Care | Payer: Medicare Other | Source: Ambulatory Visit | Attending: Radiation Oncology | Admitting: Radiation Oncology

## 2021-08-11 ENCOUNTER — Other Ambulatory Visit: Payer: Self-pay

## 2021-08-11 DIAGNOSIS — Z191 Hormone sensitive malignancy status: Secondary | ICD-10-CM | POA: Diagnosis not present

## 2021-08-11 DIAGNOSIS — C61 Malignant neoplasm of prostate: Secondary | ICD-10-CM | POA: Diagnosis not present

## 2021-08-11 DIAGNOSIS — K760 Fatty (change of) liver, not elsewhere classified: Secondary | ICD-10-CM | POA: Diagnosis not present

## 2021-08-11 DIAGNOSIS — Z510 Encounter for antineoplastic radiation therapy: Secondary | ICD-10-CM | POA: Diagnosis not present

## 2021-08-11 LAB — RAD ONC ARIA SESSION SUMMARY
Course Elapsed Days: 32
Plan Fractions Treated to Date: 23
Plan Prescribed Dose Per Fraction: 1.8 Gy
Plan Total Fractions Prescribed: 25
Plan Total Prescribed Dose: 45 Gy
Reference Point Dosage Given to Date: 41.4 Gy
Reference Point Session Dosage Given: 1.8 Gy
Session Number: 23

## 2021-08-12 ENCOUNTER — Other Ambulatory Visit: Payer: Self-pay

## 2021-08-12 ENCOUNTER — Ambulatory Visit
Admission: RE | Admit: 2021-08-12 | Discharge: 2021-08-12 | Disposition: A | Discharge status: Home / Self Care | Payer: Medicare Other | Source: Ambulatory Visit | Attending: Radiation Oncology | Admitting: Radiation Oncology

## 2021-08-12 DIAGNOSIS — Z510 Encounter for antineoplastic radiation therapy: Secondary | ICD-10-CM | POA: Diagnosis not present

## 2021-08-12 DIAGNOSIS — Z191 Hormone sensitive malignancy status: Secondary | ICD-10-CM | POA: Diagnosis not present

## 2021-08-12 DIAGNOSIS — K760 Fatty (change of) liver, not elsewhere classified: Secondary | ICD-10-CM | POA: Diagnosis not present

## 2021-08-12 DIAGNOSIS — C61 Malignant neoplasm of prostate: Secondary | ICD-10-CM | POA: Diagnosis not present

## 2021-08-12 LAB — RAD ONC ARIA SESSION SUMMARY
Course Elapsed Days: 33
Plan Fractions Treated to Date: 24
Plan Prescribed Dose Per Fraction: 1.8 Gy
Plan Total Fractions Prescribed: 25
Plan Total Prescribed Dose: 45 Gy
Reference Point Dosage Given to Date: 43.2 Gy
Reference Point Session Dosage Given: 1.8 Gy
Session Number: 24

## 2021-08-13 ENCOUNTER — Other Ambulatory Visit: Payer: Self-pay

## 2021-08-13 ENCOUNTER — Ambulatory Visit
Admission: RE | Admit: 2021-08-13 | Discharge: 2021-08-13 | Disposition: A | Discharge status: Home / Self Care | Payer: Medicare Other | Source: Ambulatory Visit | Attending: Radiation Oncology | Admitting: Radiation Oncology

## 2021-08-13 DIAGNOSIS — Z510 Encounter for antineoplastic radiation therapy: Secondary | ICD-10-CM | POA: Diagnosis not present

## 2021-08-13 DIAGNOSIS — E7800 Pure hypercholesterolemia, unspecified: Secondary | ICD-10-CM | POA: Diagnosis not present

## 2021-08-13 DIAGNOSIS — Z191 Hormone sensitive malignancy status: Secondary | ICD-10-CM | POA: Diagnosis not present

## 2021-08-13 DIAGNOSIS — K760 Fatty (change of) liver, not elsewhere classified: Secondary | ICD-10-CM | POA: Diagnosis not present

## 2021-08-13 DIAGNOSIS — G8929 Other chronic pain: Secondary | ICD-10-CM | POA: Diagnosis not present

## 2021-08-13 DIAGNOSIS — N400 Enlarged prostate without lower urinary tract symptoms: Secondary | ICD-10-CM | POA: Diagnosis not present

## 2021-08-13 DIAGNOSIS — I10 Essential (primary) hypertension: Secondary | ICD-10-CM | POA: Diagnosis not present

## 2021-08-13 DIAGNOSIS — N1831 Chronic kidney disease, stage 3a: Secondary | ICD-10-CM | POA: Diagnosis not present

## 2021-08-13 DIAGNOSIS — K219 Gastro-esophageal reflux disease without esophagitis: Secondary | ICD-10-CM | POA: Diagnosis not present

## 2021-08-13 DIAGNOSIS — E1169 Type 2 diabetes mellitus with other specified complication: Secondary | ICD-10-CM | POA: Diagnosis not present

## 2021-08-13 DIAGNOSIS — C61 Malignant neoplasm of prostate: Secondary | ICD-10-CM | POA: Diagnosis not present

## 2021-08-13 LAB — RAD ONC ARIA SESSION SUMMARY
Course Elapsed Days: 34
Plan Fractions Treated to Date: 25
Plan Prescribed Dose Per Fraction: 1.8 Gy
Plan Total Fractions Prescribed: 25
Plan Total Prescribed Dose: 45 Gy
Reference Point Dosage Given to Date: 45 Gy
Reference Point Session Dosage Given: 1.8 Gy
Session Number: 25

## 2021-08-14 ENCOUNTER — Ambulatory Visit: Payer: Medicare Other

## 2021-08-14 ENCOUNTER — Other Ambulatory Visit: Payer: Self-pay

## 2021-08-14 ENCOUNTER — Ambulatory Visit
Admission: RE | Admit: 2021-08-14 | Discharge: 2021-08-14 | Disposition: A | Discharge status: Home / Self Care | Payer: Medicare Other | Source: Ambulatory Visit | Attending: Radiation Oncology | Admitting: Radiation Oncology

## 2021-08-14 DIAGNOSIS — E538 Deficiency of other specified B group vitamins: Secondary | ICD-10-CM | POA: Diagnosis not present

## 2021-08-14 DIAGNOSIS — M549 Dorsalgia, unspecified: Secondary | ICD-10-CM | POA: Diagnosis not present

## 2021-08-14 DIAGNOSIS — Z510 Encounter for antineoplastic radiation therapy: Secondary | ICD-10-CM | POA: Diagnosis not present

## 2021-08-14 DIAGNOSIS — E1140 Type 2 diabetes mellitus with diabetic neuropathy, unspecified: Secondary | ICD-10-CM | POA: Diagnosis not present

## 2021-08-14 DIAGNOSIS — Z191 Hormone sensitive malignancy status: Secondary | ICD-10-CM | POA: Diagnosis not present

## 2021-08-14 DIAGNOSIS — C61 Malignant neoplasm of prostate: Secondary | ICD-10-CM | POA: Diagnosis not present

## 2021-08-14 DIAGNOSIS — K760 Fatty (change of) liver, not elsewhere classified: Secondary | ICD-10-CM | POA: Diagnosis not present

## 2021-08-14 DIAGNOSIS — R350 Frequency of micturition: Secondary | ICD-10-CM | POA: Diagnosis not present

## 2021-08-14 DIAGNOSIS — R5383 Other fatigue: Secondary | ICD-10-CM | POA: Diagnosis not present

## 2021-08-14 LAB — RAD ONC ARIA SESSION SUMMARY
Course Elapsed Days: 35
Plan Fractions Treated to Date: 1
Plan Prescribed Dose Per Fraction: 2 Gy
Plan Total Fractions Prescribed: 15
Plan Total Prescribed Dose: 30 Gy
Reference Point Dosage Given to Date: 47 Gy
Reference Point Session Dosage Given: 2 Gy
Session Number: 26

## 2021-08-15 ENCOUNTER — Ambulatory Visit: Payer: Medicare Other

## 2021-08-15 ENCOUNTER — Other Ambulatory Visit: Payer: Self-pay

## 2021-08-15 ENCOUNTER — Ambulatory Visit
Admission: RE | Admit: 2021-08-15 | Discharge: 2021-08-15 | Disposition: A | Discharge status: Home / Self Care | Payer: Medicare Other | Source: Ambulatory Visit | Attending: Radiation Oncology | Admitting: Radiation Oncology

## 2021-08-15 DIAGNOSIS — K760 Fatty (change of) liver, not elsewhere classified: Secondary | ICD-10-CM | POA: Diagnosis not present

## 2021-08-15 DIAGNOSIS — Z191 Hormone sensitive malignancy status: Secondary | ICD-10-CM | POA: Diagnosis not present

## 2021-08-15 DIAGNOSIS — Z510 Encounter for antineoplastic radiation therapy: Secondary | ICD-10-CM | POA: Diagnosis not present

## 2021-08-15 DIAGNOSIS — C61 Malignant neoplasm of prostate: Secondary | ICD-10-CM

## 2021-08-15 LAB — RAD ONC ARIA SESSION SUMMARY
Course Elapsed Days: 36
Plan Fractions Treated to Date: 2
Plan Prescribed Dose Per Fraction: 2 Gy
Plan Total Fractions Prescribed: 15
Plan Total Prescribed Dose: 30 Gy
Reference Point Dosage Given to Date: 49 Gy
Reference Point Session Dosage Given: 2 Gy
Session Number: 27

## 2021-08-15 MED ORDER — SONAFINE EX EMUL
1.0000 "application " | Freq: Two times a day (BID) | CUTANEOUS | Status: DC
  Administered 2021-08-15: 1 via TOPICAL

## 2021-08-18 ENCOUNTER — Ambulatory Visit: Payer: Medicare Other

## 2021-08-18 ENCOUNTER — Ambulatory Visit
Admission: RE | Admit: 2021-08-18 | Discharge: 2021-08-18 | Disposition: A | Discharge status: Home / Self Care | Payer: Medicare Other | Source: Ambulatory Visit | Attending: Radiation Oncology | Admitting: Radiation Oncology

## 2021-08-18 ENCOUNTER — Other Ambulatory Visit: Payer: Self-pay

## 2021-08-18 DIAGNOSIS — Z510 Encounter for antineoplastic radiation therapy: Secondary | ICD-10-CM | POA: Diagnosis not present

## 2021-08-18 DIAGNOSIS — C61 Malignant neoplasm of prostate: Secondary | ICD-10-CM | POA: Insufficient documentation

## 2021-08-18 DIAGNOSIS — Z191 Hormone sensitive malignancy status: Secondary | ICD-10-CM | POA: Diagnosis not present

## 2021-08-18 LAB — RAD ONC ARIA SESSION SUMMARY
Course Elapsed Days: 39
Plan Fractions Treated to Date: 3
Plan Prescribed Dose Per Fraction: 2 Gy
Plan Total Fractions Prescribed: 15
Plan Total Prescribed Dose: 30 Gy
Reference Point Dosage Given to Date: 51 Gy
Reference Point Session Dosage Given: 2 Gy
Session Number: 28

## 2021-08-19 ENCOUNTER — Other Ambulatory Visit: Payer: Self-pay

## 2021-08-19 ENCOUNTER — Ambulatory Visit
Admission: RE | Admit: 2021-08-19 | Discharge: 2021-08-19 | Disposition: A | Discharge status: Home / Self Care | Payer: Medicare Other | Source: Ambulatory Visit | Attending: Radiation Oncology | Admitting: Radiation Oncology

## 2021-08-19 DIAGNOSIS — Z191 Hormone sensitive malignancy status: Secondary | ICD-10-CM | POA: Diagnosis not present

## 2021-08-19 DIAGNOSIS — R3915 Urgency of urination: Secondary | ICD-10-CM | POA: Diagnosis not present

## 2021-08-19 DIAGNOSIS — C61 Malignant neoplasm of prostate: Secondary | ICD-10-CM | POA: Diagnosis not present

## 2021-08-19 DIAGNOSIS — Z510 Encounter for antineoplastic radiation therapy: Secondary | ICD-10-CM | POA: Diagnosis not present

## 2021-08-19 DIAGNOSIS — Z20822 Contact with and (suspected) exposure to covid-19: Secondary | ICD-10-CM | POA: Diagnosis not present

## 2021-08-19 LAB — RAD ONC ARIA SESSION SUMMARY
Course Elapsed Days: 40
Plan Fractions Treated to Date: 4
Plan Prescribed Dose Per Fraction: 2 Gy
Plan Total Fractions Prescribed: 15
Plan Total Prescribed Dose: 30 Gy
Reference Point Dosage Given to Date: 53 Gy
Reference Point Session Dosage Given: 2 Gy
Session Number: 29

## 2021-08-20 ENCOUNTER — Ambulatory Visit
Admission: RE | Admit: 2021-08-20 | Discharge: 2021-08-20 | Disposition: A | Discharge status: Home / Self Care | Payer: Medicare Other | Source: Ambulatory Visit | Attending: Radiation Oncology | Admitting: Radiation Oncology

## 2021-08-20 ENCOUNTER — Other Ambulatory Visit: Payer: Self-pay

## 2021-08-20 DIAGNOSIS — Z191 Hormone sensitive malignancy status: Secondary | ICD-10-CM | POA: Diagnosis not present

## 2021-08-20 DIAGNOSIS — C61 Malignant neoplasm of prostate: Secondary | ICD-10-CM | POA: Diagnosis not present

## 2021-08-20 DIAGNOSIS — Z510 Encounter for antineoplastic radiation therapy: Secondary | ICD-10-CM | POA: Diagnosis not present

## 2021-08-20 LAB — RAD ONC ARIA SESSION SUMMARY
Course Elapsed Days: 41
Plan Fractions Treated to Date: 5
Plan Prescribed Dose Per Fraction: 2 Gy
Plan Total Fractions Prescribed: 15
Plan Total Prescribed Dose: 30 Gy
Reference Point Dosage Given to Date: 55 Gy
Reference Point Session Dosage Given: 2 Gy
Session Number: 30

## 2021-08-21 ENCOUNTER — Other Ambulatory Visit: Payer: Self-pay

## 2021-08-21 ENCOUNTER — Ambulatory Visit
Admission: RE | Admit: 2021-08-21 | Discharge: 2021-08-21 | Disposition: A | Discharge status: Home / Self Care | Payer: Medicare Other | Source: Ambulatory Visit | Attending: Radiation Oncology | Admitting: Radiation Oncology

## 2021-08-21 DIAGNOSIS — C61 Malignant neoplasm of prostate: Secondary | ICD-10-CM | POA: Diagnosis not present

## 2021-08-21 DIAGNOSIS — Z191 Hormone sensitive malignancy status: Secondary | ICD-10-CM | POA: Diagnosis not present

## 2021-08-21 DIAGNOSIS — Z20822 Contact with and (suspected) exposure to covid-19: Secondary | ICD-10-CM | POA: Diagnosis not present

## 2021-08-21 DIAGNOSIS — Z510 Encounter for antineoplastic radiation therapy: Secondary | ICD-10-CM | POA: Diagnosis not present

## 2021-08-21 LAB — RAD ONC ARIA SESSION SUMMARY
Course Elapsed Days: 42
Plan Fractions Treated to Date: 6
Plan Prescribed Dose Per Fraction: 2 Gy
Plan Total Fractions Prescribed: 15
Plan Total Prescribed Dose: 30 Gy
Reference Point Dosage Given to Date: 57 Gy
Reference Point Session Dosage Given: 2 Gy
Session Number: 31

## 2021-08-22 ENCOUNTER — Ambulatory Visit: Payer: Medicare Other

## 2021-08-25 ENCOUNTER — Ambulatory Visit
Admission: RE | Admit: 2021-08-25 | Discharge: 2021-08-25 | Disposition: A | Discharge status: Home / Self Care | Payer: Medicare Other | Source: Ambulatory Visit | Attending: Radiation Oncology | Admitting: Radiation Oncology

## 2021-08-25 ENCOUNTER — Other Ambulatory Visit: Payer: Self-pay

## 2021-08-25 DIAGNOSIS — Z191 Hormone sensitive malignancy status: Secondary | ICD-10-CM | POA: Diagnosis not present

## 2021-08-25 DIAGNOSIS — Z510 Encounter for antineoplastic radiation therapy: Secondary | ICD-10-CM | POA: Diagnosis not present

## 2021-08-25 DIAGNOSIS — C61 Malignant neoplasm of prostate: Secondary | ICD-10-CM | POA: Diagnosis not present

## 2021-08-25 LAB — RAD ONC ARIA SESSION SUMMARY
Course Elapsed Days: 46
Plan Fractions Treated to Date: 7
Plan Prescribed Dose Per Fraction: 2 Gy
Plan Total Fractions Prescribed: 15
Plan Total Prescribed Dose: 30 Gy
Reference Point Dosage Given to Date: 59 Gy
Reference Point Session Dosage Given: 2 Gy
Session Number: 32

## 2021-08-26 ENCOUNTER — Ambulatory Visit
Admission: RE | Admit: 2021-08-26 | Discharge: 2021-08-26 | Disposition: A | Discharge status: Home / Self Care | Payer: Medicare Other | Source: Ambulatory Visit | Attending: Radiation Oncology | Admitting: Radiation Oncology

## 2021-08-26 ENCOUNTER — Other Ambulatory Visit: Payer: Self-pay

## 2021-08-26 DIAGNOSIS — Z510 Encounter for antineoplastic radiation therapy: Secondary | ICD-10-CM | POA: Diagnosis not present

## 2021-08-26 DIAGNOSIS — C61 Malignant neoplasm of prostate: Secondary | ICD-10-CM | POA: Diagnosis not present

## 2021-08-26 DIAGNOSIS — Z191 Hormone sensitive malignancy status: Secondary | ICD-10-CM | POA: Diagnosis not present

## 2021-08-26 LAB — RAD ONC ARIA SESSION SUMMARY
Course Elapsed Days: 47
Plan Fractions Treated to Date: 8
Plan Prescribed Dose Per Fraction: 2 Gy
Plan Total Fractions Prescribed: 15
Plan Total Prescribed Dose: 30 Gy
Reference Point Dosage Given to Date: 61 Gy
Reference Point Session Dosage Given: 2 Gy
Session Number: 33

## 2021-08-27 ENCOUNTER — Other Ambulatory Visit: Payer: Self-pay

## 2021-08-27 ENCOUNTER — Ambulatory Visit
Admission: RE | Admit: 2021-08-27 | Discharge: 2021-08-27 | Disposition: A | Discharge status: Home / Self Care | Payer: Medicare Other | Source: Ambulatory Visit | Attending: Radiation Oncology | Admitting: Radiation Oncology

## 2021-08-27 DIAGNOSIS — Z510 Encounter for antineoplastic radiation therapy: Secondary | ICD-10-CM | POA: Diagnosis not present

## 2021-08-27 DIAGNOSIS — C61 Malignant neoplasm of prostate: Secondary | ICD-10-CM | POA: Diagnosis not present

## 2021-08-27 DIAGNOSIS — Z191 Hormone sensitive malignancy status: Secondary | ICD-10-CM | POA: Diagnosis not present

## 2021-08-27 LAB — RAD ONC ARIA SESSION SUMMARY
Course Elapsed Days: 48
Plan Fractions Treated to Date: 9
Plan Prescribed Dose Per Fraction: 2 Gy
Plan Total Fractions Prescribed: 15
Plan Total Prescribed Dose: 30 Gy
Reference Point Dosage Given to Date: 63 Gy
Reference Point Session Dosage Given: 2 Gy
Session Number: 34

## 2021-08-28 ENCOUNTER — Other Ambulatory Visit: Payer: Self-pay

## 2021-08-28 ENCOUNTER — Ambulatory Visit
Admission: RE | Admit: 2021-08-28 | Discharge: 2021-08-28 | Disposition: A | Discharge status: Home / Self Care | Payer: Medicare Other | Source: Ambulatory Visit | Attending: Radiation Oncology | Admitting: Radiation Oncology

## 2021-08-28 DIAGNOSIS — C61 Malignant neoplasm of prostate: Secondary | ICD-10-CM | POA: Diagnosis not present

## 2021-08-28 DIAGNOSIS — Z510 Encounter for antineoplastic radiation therapy: Secondary | ICD-10-CM | POA: Diagnosis not present

## 2021-08-28 DIAGNOSIS — Z191 Hormone sensitive malignancy status: Secondary | ICD-10-CM | POA: Diagnosis not present

## 2021-08-28 LAB — RAD ONC ARIA SESSION SUMMARY
Course Elapsed Days: 49
Plan Fractions Treated to Date: 10
Plan Prescribed Dose Per Fraction: 2 Gy
Plan Total Fractions Prescribed: 15
Plan Total Prescribed Dose: 30 Gy
Reference Point Dosage Given to Date: 65 Gy
Reference Point Session Dosage Given: 2 Gy
Session Number: 35

## 2021-08-29 ENCOUNTER — Ambulatory Visit: Payer: Medicare Other

## 2021-08-29 ENCOUNTER — Ambulatory Visit
Admission: RE | Admit: 2021-08-29 | Discharge: 2021-08-29 | Disposition: A | Discharge status: Home / Self Care | Payer: Medicare Other | Source: Ambulatory Visit | Attending: Radiation Oncology | Admitting: Radiation Oncology

## 2021-08-29 ENCOUNTER — Other Ambulatory Visit: Payer: Self-pay

## 2021-08-29 DIAGNOSIS — C61 Malignant neoplasm of prostate: Secondary | ICD-10-CM | POA: Diagnosis not present

## 2021-08-29 DIAGNOSIS — Z191 Hormone sensitive malignancy status: Secondary | ICD-10-CM | POA: Diagnosis not present

## 2021-08-29 DIAGNOSIS — Z510 Encounter for antineoplastic radiation therapy: Secondary | ICD-10-CM | POA: Diagnosis not present

## 2021-08-29 LAB — RAD ONC ARIA SESSION SUMMARY
Course Elapsed Days: 50
Plan Fractions Treated to Date: 11
Plan Prescribed Dose Per Fraction: 2 Gy
Plan Total Fractions Prescribed: 15
Plan Total Prescribed Dose: 30 Gy
Reference Point Dosage Given to Date: 67 Gy
Reference Point Session Dosage Given: 2 Gy
Session Number: 36

## 2021-09-01 ENCOUNTER — Other Ambulatory Visit: Payer: Self-pay

## 2021-09-01 ENCOUNTER — Ambulatory Visit
Admission: RE | Admit: 2021-09-01 | Discharge: 2021-09-01 | Disposition: A | Discharge status: Home / Self Care | Payer: Medicare Other | Source: Ambulatory Visit | Attending: Radiation Oncology | Admitting: Radiation Oncology

## 2021-09-01 DIAGNOSIS — Z510 Encounter for antineoplastic radiation therapy: Secondary | ICD-10-CM | POA: Diagnosis not present

## 2021-09-01 DIAGNOSIS — Z191 Hormone sensitive malignancy status: Secondary | ICD-10-CM | POA: Diagnosis not present

## 2021-09-01 DIAGNOSIS — C61 Malignant neoplasm of prostate: Secondary | ICD-10-CM | POA: Diagnosis not present

## 2021-09-01 LAB — RAD ONC ARIA SESSION SUMMARY
Course Elapsed Days: 53
Plan Fractions Treated to Date: 12
Plan Prescribed Dose Per Fraction: 2 Gy
Plan Total Fractions Prescribed: 15
Plan Total Prescribed Dose: 30 Gy
Reference Point Dosage Given to Date: 69 Gy
Reference Point Session Dosage Given: 2 Gy
Session Number: 37

## 2021-09-02 ENCOUNTER — Ambulatory Visit
Admission: RE | Admit: 2021-09-02 | Discharge: 2021-09-02 | Disposition: A | Discharge status: Home / Self Care | Payer: Medicare Other | Source: Ambulatory Visit | Attending: Radiation Oncology | Admitting: Radiation Oncology

## 2021-09-02 ENCOUNTER — Other Ambulatory Visit: Payer: Self-pay

## 2021-09-02 DIAGNOSIS — Z191 Hormone sensitive malignancy status: Secondary | ICD-10-CM | POA: Diagnosis not present

## 2021-09-02 DIAGNOSIS — Z510 Encounter for antineoplastic radiation therapy: Secondary | ICD-10-CM | POA: Diagnosis not present

## 2021-09-02 DIAGNOSIS — E538 Deficiency of other specified B group vitamins: Secondary | ICD-10-CM | POA: Diagnosis not present

## 2021-09-02 DIAGNOSIS — C61 Malignant neoplasm of prostate: Secondary | ICD-10-CM | POA: Diagnosis not present

## 2021-09-02 LAB — RAD ONC ARIA SESSION SUMMARY
Course Elapsed Days: 54
Plan Fractions Treated to Date: 13
Plan Prescribed Dose Per Fraction: 2 Gy
Plan Total Fractions Prescribed: 15
Plan Total Prescribed Dose: 30 Gy
Reference Point Dosage Given to Date: 71 Gy
Reference Point Session Dosage Given: 2 Gy
Session Number: 38

## 2021-09-03 ENCOUNTER — Ambulatory Visit
Admission: RE | Admit: 2021-09-03 | Discharge: 2021-09-03 | Disposition: A | Discharge status: Home / Self Care | Payer: Medicare Other | Source: Ambulatory Visit | Attending: Radiation Oncology | Admitting: Radiation Oncology

## 2021-09-03 ENCOUNTER — Other Ambulatory Visit: Payer: Self-pay

## 2021-09-03 ENCOUNTER — Ambulatory Visit: Payer: Medicare Other

## 2021-09-03 DIAGNOSIS — Z191 Hormone sensitive malignancy status: Secondary | ICD-10-CM | POA: Diagnosis not present

## 2021-09-03 DIAGNOSIS — C61 Malignant neoplasm of prostate: Secondary | ICD-10-CM | POA: Diagnosis not present

## 2021-09-03 DIAGNOSIS — Z510 Encounter for antineoplastic radiation therapy: Secondary | ICD-10-CM | POA: Diagnosis not present

## 2021-09-03 LAB — RAD ONC ARIA SESSION SUMMARY
Course Elapsed Days: 55
Plan Fractions Treated to Date: 14
Plan Prescribed Dose Per Fraction: 2 Gy
Plan Total Fractions Prescribed: 15
Plan Total Prescribed Dose: 30 Gy
Reference Point Dosage Given to Date: 73 Gy
Reference Point Session Dosage Given: 2 Gy
Session Number: 39

## 2021-09-04 ENCOUNTER — Ambulatory Visit: Payer: Medicare Other

## 2021-09-04 ENCOUNTER — Encounter: Payer: Self-pay | Admitting: Urology

## 2021-09-04 ENCOUNTER — Ambulatory Visit
Admission: RE | Admit: 2021-09-04 | Discharge: 2021-09-04 | Disposition: A | Discharge status: Home / Self Care | Payer: Medicare Other | Source: Ambulatory Visit | Attending: Radiation Oncology | Admitting: Radiation Oncology

## 2021-09-04 ENCOUNTER — Other Ambulatory Visit: Payer: Self-pay

## 2021-09-04 DIAGNOSIS — C61 Malignant neoplasm of prostate: Secondary | ICD-10-CM | POA: Diagnosis not present

## 2021-09-04 DIAGNOSIS — Z191 Hormone sensitive malignancy status: Secondary | ICD-10-CM | POA: Diagnosis not present

## 2021-09-04 DIAGNOSIS — Z510 Encounter for antineoplastic radiation therapy: Secondary | ICD-10-CM | POA: Diagnosis not present

## 2021-09-04 LAB — RAD ONC ARIA SESSION SUMMARY
Course Elapsed Days: 56
Plan Fractions Treated to Date: 15
Plan Prescribed Dose Per Fraction: 2 Gy
Plan Total Fractions Prescribed: 15
Plan Total Prescribed Dose: 30 Gy
Reference Point Dosage Given to Date: 75 Gy
Reference Point Session Dosage Given: 2 Gy
Session Number: 40

## 2021-09-04 MED ORDER — SONAFINE EX EMUL
1.0000 "application " | Freq: Two times a day (BID) | CUTANEOUS | Status: DC
  Administered 2021-09-04: 1 via TOPICAL

## 2021-09-05 ENCOUNTER — Ambulatory Visit: Payer: Medicare Other

## 2021-09-08 ENCOUNTER — Ambulatory Visit (INDEPENDENT_AMBULATORY_CARE_PROVIDER_SITE_OTHER): Payer: Medicare Other | Admitting: Podiatry

## 2021-09-08 ENCOUNTER — Ambulatory Visit: Payer: Medicare Other

## 2021-09-08 DIAGNOSIS — M659 Synovitis and tenosynovitis, unspecified: Secondary | ICD-10-CM

## 2021-09-08 MED ORDER — BETAMETHASONE SOD PHOS & ACET 6 (3-3) MG/ML IJ SUSP
3.0000 mg | Freq: Once | INTRAMUSCULAR | Status: AC
  Administered 2021-09-08: 3 mg via INTRA_ARTICULAR

## 2021-09-08 NOTE — Progress Notes (Signed)
   Subjective:  76 y.o. male presenting today for follow-up evaluation of bilateral ankle pain.  Overall the patient states that he is doing well.  He says the injections helped significantly for several months.  It has been about 4 months since the patient was last seen in the office.  He says that slowly over the past few weeks the pain is returned.   Past Medical History:  Diagnosis Date   Anemia    B 12 DEFICIENCY   Arthritis    Back pain    Diabetes mellitus without complication (HCC)    Hypertension    Prostate enlargement    Sleep apnea    USES C-PAP   Past Surgical History:  Procedure Laterality Date   APPENDECTOMY     BACK SURGERY     GOLD SEED IMPLANT N/A 06/24/2021   Procedure: GOLD SEED IMPLANT;  Surgeon: Remi Haggard, MD;  [facility];  Service: Urology;  Laterality: N/A;   REFRACTIVE SURGERY  2008   bilateral eyes   ROTATOR CUFF REPAIR     RT   SPACE OAR INSTILLATION N/A 06/24/2021   Procedure: SPACE OAR INSTILLATION;  Surgeon: Remi Haggard, MD;  Location: [facility];  Service: Urology;  Laterality: N/A;   TOTAL KNEE ARTHROPLASTY  02/29/2012   Procedure: TOTAL KNEE ARTHROPLASTY;  Surgeon: Gearlean Alf, MD;  Location: [facility];  Service: Orthopedics;  Laterality: Left;   TOTAL KNEE ARTHROPLASTY Right 09/26/2012   Procedure: RIGHT TOTAL KNEE ARTHROPLASTY;  Surgeon: Gearlean Alf, MD;  Location: [facility];  Service: Orthopedics;  Laterality: Right;   No Known Allergies   Objective / Physical Exam:  General:  The patient is alert and oriented x3 in no acute distress. Dermatology:  Skin is warm, dry and supple bilateral lower extremities. Negative for open lesions or macerations. Vascular:  Palpable pedal pulses bilaterally. No edema or erythema noted. Capillary refill within normal limits. Neurological:  Epicritic and protective threshold grossly intact bilaterally.  Musculoskeletal Exam:  Pain on palpation to the  anterior lateral medial aspects of the patient's bilateral ankles. Mild edema noted. Range of motion within normal limits to all pedal and ankle joints bilateral. Muscle strength 5/5 in all groups bilateral.   Radiographic Exam Ankle B/L 04/30/2021:  Normal osseous mineralization. Joint spaces preserved. No fracture/dislocation/boney destruction.    Assessment: 1.  Capsulitis bilateral ankles  Plan of Care:  1. Patient was evaluated. X-Rays reviewed.  2. Injection of 0.5 mL Celestone Soluspan injected in the patient's bilateral ankle joints.  3.  Continue Celebrex 100 mg daily as needed 4.  Continue compression ankle sleeves.  Wear daily.  Patient states that this has been very helpful in the past 5.  Return to clinic as needed    Edrick Kins, DPM [facility]  Dr. Edrick Kins, DPM    [address], [place] [address]                Office [phone]  Fax [phone]

## 2021-09-09 ENCOUNTER — Ambulatory Visit: Payer: Medicare Other

## 2021-09-10 ENCOUNTER — Ambulatory Visit: Payer: Medicare Other | Admitting: Podiatry

## 2021-09-16 DIAGNOSIS — E538 Deficiency of other specified B group vitamins: Secondary | ICD-10-CM | POA: Diagnosis not present

## 2021-09-30 DIAGNOSIS — E538 Deficiency of other specified B group vitamins: Secondary | ICD-10-CM | POA: Diagnosis not present

## 2021-10-08 ENCOUNTER — Encounter: Payer: Self-pay | Admitting: Urology

## 2021-10-08 NOTE — Progress Notes (Signed)
Telephone appointment. I spoke w/ patient, verified identity and began nursing interview. Patient reports bilateral hip pain 6/10, managed by Dr. Jeanie Cooks. No other issues reported at this time.  Meaningful use complete. Flomax as directed. I-PSS score of  5-mild Urology appt- July, 2023  Reminded patient of his 10:30am-10/09/21 telephone appointment w/ Ashlyn Bruning PA-C. I left my extension [phone] in case patient needs anything. Patient verbalized understanding.  Patient contact [phone]

## 2021-10-09 ENCOUNTER — Ambulatory Visit
Admission: RE | Admit: 2021-10-09 | Discharge: 2021-10-09 | Disposition: A | Discharge status: Home / Self Care | Payer: Medicare Other | Source: Ambulatory Visit | Attending: Radiation Oncology | Admitting: Radiation Oncology

## 2021-10-09 DIAGNOSIS — C61 Malignant neoplasm of prostate: Secondary | ICD-10-CM | POA: Insufficient documentation

## 2021-10-09 NOTE — Progress Notes (Signed)
Radiation Oncology         [phone] ________________________________  Name: Gregory Estrada MRN: [id number]  Date: 10/09/2021  DOB: 12-01-1945  Post Treatment Note  CC: Wenda Low, MD  Alexis Frock, MD  Diagnosis:   76 y.o. gentleman with Stage T1c adenocarcinoma of the prostate with Gleason score of 4+4, and PSA of 7.13.  Interval Since Last Radiation:  5 weeks  07/10/21 - 09/04/21: Concurrent with LT-ADT 1. The prostate, seminal vesicles, and pelvic lymph nodes were initially treated to 45 Gy in 25 fractions of 1.8 Gy  2. The prostate only was boosted to 75 Gy with 15 additional fractions of 2.0 Gy   Narrative:  I spoke with the patient to conduct his routine scheduled 1 month follow up visit via telephone to spare the patient unnecessary potential exposure in the healthcare setting during the current COVID-19 pandemic.  The patient was notified in advance and gave permission to proceed with this visit format.  He tolerated radiation treatment relatively well with only minor urinary irritation and modest fatigue.  He reported nocturia 4-5 times per night and increased daytime frequency and urgency but denied dysuria, gross hematuria, incomplete bladder emptying or incontinence.  He did have some mild skin irritation in the treatment field that was managed with Sonafine cream as needed.  He also experienced occasional diarrhea that responded well to Imodium as needed.                              On review of systems, the patient states that he is doing very well in general.  He continues with some mild increased frequency and urgency but feels like this is almost back to his baseline and improved with Flomax and oxybutynin daily.  He specifically denies dysuria, gross hematuria, straining to void, incomplete bladder emptying or incontinence.  He reports a healthy appetite and is maintaining his weight.  He denies abdominal pain, nausea, vomiting, diarrhea or constipation.  He  continues to tolerate the ADT fairly well despite decreased stamina and occasional hot flashes.  Overall, he is quite pleased with his progress to date.  ALLERGIES:  has No Known Allergies.  Meds: Current Outpatient Medications  Medication Sig Dispense Refill   acetaminophen (TYLENOL) 325 MG tablet Take 2 tablets (650 mg total) by mouth every 6 (six) hours as needed. 60 tablet 0   allopurinol (ZYLOPRIM) 100 MG tablet Take 100 mg by mouth daily.     alum & mag hydroxide-simeth (MAALOX/MYLANTA) 200-200-20 MG/5ML suspension Take 30 mLs by mouth every 6 (six) hours as needed. 355 mL 0   Azelastine HCl 137 MCG/SPRAY SOLN Place into both nostrils.     B-D UF III MINI PEN NEEDLES 31G X 5 MM MISC USE 1 SUBCUTANEOUSLY FOR 30 DAYS     bicalutamide (CASODEX) 50 MG tablet Take 50 mg by mouth daily.     celecoxib (CELEBREX) 100 MG capsule Take 1 capsule (100 mg total) by mouth 2 (two) times daily. 60 capsule 1   cetirizine (ZYRTEC) 10 MG tablet 1 tablet     doxazosin (CARDURA) 4 MG tablet Take 4 mg by mouth every evening.     fluticasone (FLONASE) 50 MCG/ACT nasal spray 1 spray in each nostril     glipiZIDE (GLUCOTROL) 5 MG tablet Take by mouth daily before breakfast.     hydrocortisone 2.5 % cream Apply topically 2 (two) times daily.     injection  device for insulin (B-D PEN MINI) DEVI See admin instructions.     JARDIANCE 25 MG TABS tablet TAKE 1 TABLET BY MOUTH ONCE DAILY FOR 30 DAYS (Patient not taking: Reported on 08/15/2021)     ketoconazole (NIZORAL) 2 % cream Apply 1 application topically daily as needed.     Lancets (ONETOUCH DELICA PLUS MPNTIR44R) MISC 2 (two) times daily. as directed     levocetirizine (XYZAL) 5 MG tablet TAKE 1 TABLET BY MOUTH ONCE DAILY IN THE EVENING FOR 30 DAYS     lisinopril (PRINIVIL,ZESTRIL) 10 MG tablet Take 10 mg by mouth every morning.      meloxicam (MOBIC) 15 MG tablet Take 1 tablet (15 mg total) by mouth daily. 30 tablet 1   metFORMIN (GLUCOPHAGE) 1000 MG tablet  TAKE 1 TABLET BY MOUTH TWICE DAILY WITH A MEAL FOR 30 DAYS     methylcellulose (ARTIFICIAL TEARS) 1 % ophthalmic solution Place 1 drop into both eyes daily as needed (for dry eyes).     ONETOUCH VERIO test strip FOR USE WHEN CHECKING BLOOD GLUCOSE UP TO TWICE DAILY. FINGER STICK     OZEMPIC, 0.25 OR 0.5 MG/DOSE, 2 MG/1.5ML SOPN INJECT 0.'5MG'$  ONCE A WEEK FOR 30 DAYS     pioglitazone (ACTOS) 15 MG tablet Take 15 mg by mouth daily.     Saxagliptin-Metformin 2.08-998 MG TB24 Take 2 tablets by mouth at bedtime.      tamsulosin (FLOMAX) 0.4 MG CAPS capsule Take 0.4 mg by mouth.     TOUJEO MAX SOLOSTAR 300 UNIT/ML SOPN INJECT 15 UNITS SUBCUTANEOUSLY ONCE DAILY FOR 30 DAYS     vitamin B-12 (CYANOCOBALAMIN) 1000 MCG tablet Take 1,000 mcg by mouth daily.     Current Facility-Administered Medications  Medication Dose Route Frequency Provider Last Rate Last Admin   betamethasone acetate-betamethasone sodium phosphate (CELESTONE) injection 3 mg  3 mg Intra-articular Once Edrick Kins, DPM        Physical Findings:  vitals were not taken for this visit.  Pain Assessment Pain Score: 6  (Bilateral hip pain)/10 Unable to assess due to telephone follow-up visit format.  Lab Findings: Lab Results  Component Value Date   WBC 7.0 09/29/2012   HGB 12.2 (L) 06/24/2021   HCT 36.0 (L) 06/24/2021   MCV 80.2 09/29/2012   PLT 131 (L) 09/29/2012     Radiographic Findings: No results found.  Impression/Plan: 1. 76 y.o. gentleman with Stage T1c adenocarcinoma of the prostate with Gleason score of 4+4, and PSA of 7.13. He will continue to follow up with urology for ongoing PSA determinations and has an appointment scheduled with Dr. Tresa Moore on 11/06/21 for his next ADT injection.  He will continue taking the Flomax and oxybutynin daily as prescribed and he understands what to expect with regards to PSA monitoring going forward. I will look forward to following his response to treatment via correspondence with  urology, and would be happy to continue to participate in his care if clinically indicated. I talked to the patient about what to expect in the future, including his risk for erectile dysfunction and rectal bleeding. I encouraged him to call or return to the office if he has any questions regarding his previous radiation or possible radiation side effects. He was comfortable with this plan and will follow up as needed.     Nicholos Johns, PA-C

## 2021-10-09 NOTE — Progress Notes (Signed)
  Radiation Oncology         [phone] ________________________________  Name: Gregory Estrada MRN: [id number]  Date: 09/04/2021  DOB: [birth year]  End of Treatment Note  Diagnosis:   75 y.o. gentleman with Stage T1c adenocarcinoma of the prostate with Gleason score of 4+4, and PSA of 7.13.     Indication for treatment:  Curative, Definitive Radiotherapy       Radiation treatment dates:   07/10/21 - 09/04/21  Site/dose:  1. The prostate, seminal vesicles, and pelvic lymph nodes were initially treated to 45 Gy in 25 fractions of 1.8 Gy  2. The prostate only was boosted to 75 Gy with 15 additional fractions of 2.0 Gy   Beams/energy:  1. The prostate, seminal vesicles, and pelvic lymph nodes were initially treated using VMAT intensity modulated radiotherapy delivering 6 megavolt photons. Image guidance was performed with CB-CT studies prior to each fraction. He was immobilized with a body fix lower extremity mold.  2. the prostate only was boosted using VMAT intensity modulated radiotherapy delivering 6 megavolt photons. Image guidance was performed with CB-CT studies prior to each fraction. He was immobilized with a body fix lower extremity mold.  Narrative: The patient tolerated radiation treatment relatively well with only minor urinary irritation and modest fatigue.  He reported nocturia 4-5 times per night and increased daytime frequency and urgency but denied dysuria, gross hematuria, incomplete bladder emptying or incontinence.  He did have some mild skin irritation in the treatment field that was managed with Sonafine cream as needed.  He also experienced occasional diarrhea that responded well to Imodium as needed.  Plan: The patient has completed radiation treatment. He will return to radiation oncology clinic for routine followup in one month. I advised him to call or return sooner if he has any questions or concerns related to his recovery or  treatment. ________________________________  Sheral Apley. Tammi Klippel, M.D.

## 2021-10-14 DIAGNOSIS — E538 Deficiency of other specified B group vitamins: Secondary | ICD-10-CM | POA: Diagnosis not present

## 2021-10-14 DIAGNOSIS — Z7984 Long term (current) use of oral hypoglycemic drugs: Secondary | ICD-10-CM | POA: Diagnosis not present

## 2021-10-14 DIAGNOSIS — E119 Type 2 diabetes mellitus without complications: Secondary | ICD-10-CM | POA: Diagnosis not present

## 2021-10-14 DIAGNOSIS — Z961 Presence of intraocular lens: Secondary | ICD-10-CM | POA: Diagnosis not present

## 2021-10-15 DIAGNOSIS — E119 Type 2 diabetes mellitus without complications: Secondary | ICD-10-CM | POA: Diagnosis not present

## 2021-10-15 DIAGNOSIS — M25511 Pain in right shoulder: Secondary | ICD-10-CM | POA: Diagnosis not present

## 2021-10-15 DIAGNOSIS — M48062 Spinal stenosis, lumbar region with neurogenic claudication: Secondary | ICD-10-CM | POA: Diagnosis not present

## 2021-10-15 DIAGNOSIS — E538 Deficiency of other specified B group vitamins: Secondary | ICD-10-CM | POA: Diagnosis not present

## 2021-11-04 DIAGNOSIS — K7581 Nonalcoholic steatohepatitis (NASH): Secondary | ICD-10-CM | POA: Diagnosis not present

## 2021-11-04 DIAGNOSIS — E1140 Type 2 diabetes mellitus with diabetic neuropathy, unspecified: Secondary | ICD-10-CM | POA: Diagnosis not present

## 2021-11-04 DIAGNOSIS — Z1331 Encounter for screening for depression: Secondary | ICD-10-CM | POA: Diagnosis not present

## 2021-11-04 DIAGNOSIS — Z0000 Encounter for general adult medical examination without abnormal findings: Secondary | ICD-10-CM | POA: Diagnosis not present

## 2021-11-04 DIAGNOSIS — E7800 Pure hypercholesterolemia, unspecified: Secondary | ICD-10-CM | POA: Diagnosis not present

## 2021-11-04 DIAGNOSIS — C61 Malignant neoplasm of prostate: Secondary | ICD-10-CM | POA: Diagnosis not present

## 2021-11-04 DIAGNOSIS — N1831 Chronic kidney disease, stage 3a: Secondary | ICD-10-CM | POA: Diagnosis not present

## 2021-11-04 DIAGNOSIS — D696 Thrombocytopenia, unspecified: Secondary | ICD-10-CM | POA: Diagnosis not present

## 2021-11-04 DIAGNOSIS — M5136 Other intervertebral disc degeneration, lumbar region: Secondary | ICD-10-CM | POA: Diagnosis not present

## 2021-11-04 DIAGNOSIS — I10 Essential (primary) hypertension: Secondary | ICD-10-CM | POA: Diagnosis not present

## 2021-11-04 DIAGNOSIS — M109 Gout, unspecified: Secondary | ICD-10-CM | POA: Diagnosis not present

## 2021-11-04 DIAGNOSIS — I700 Atherosclerosis of aorta: Secondary | ICD-10-CM | POA: Diagnosis not present

## 2021-11-04 DIAGNOSIS — E538 Deficiency of other specified B group vitamins: Secondary | ICD-10-CM | POA: Diagnosis not present

## 2021-11-06 DIAGNOSIS — C61 Malignant neoplasm of prostate: Secondary | ICD-10-CM | POA: Diagnosis not present

## 2021-11-11 DIAGNOSIS — C61 Malignant neoplasm of prostate: Secondary | ICD-10-CM | POA: Diagnosis not present

## 2021-11-11 DIAGNOSIS — N3000 Acute cystitis without hematuria: Secondary | ICD-10-CM | POA: Diagnosis not present

## 2021-11-11 DIAGNOSIS — R3915 Urgency of urination: Secondary | ICD-10-CM | POA: Diagnosis not present

## 2021-11-18 DIAGNOSIS — E538 Deficiency of other specified B group vitamins: Secondary | ICD-10-CM | POA: Diagnosis not present

## 2021-11-18 DIAGNOSIS — M5416 Radiculopathy, lumbar region: Secondary | ICD-10-CM | POA: Diagnosis not present

## 2021-11-24 DIAGNOSIS — R350 Frequency of micturition: Secondary | ICD-10-CM | POA: Diagnosis not present

## 2021-12-19 ENCOUNTER — Ambulatory Visit (INDEPENDENT_AMBULATORY_CARE_PROVIDER_SITE_OTHER): Payer: Medicare Other | Admitting: Podiatry

## 2021-12-19 DIAGNOSIS — M659 Synovitis and tenosynovitis, unspecified: Secondary | ICD-10-CM

## 2021-12-19 MED ORDER — BETAMETHASONE SOD PHOS & ACET 6 (3-3) MG/ML IJ SUSP
3.0000 mg | Freq: Once | INTRAMUSCULAR | Status: AC
  Administered 2021-12-19: 3 mg via INTRA_ARTICULAR

## 2021-12-19 NOTE — Progress Notes (Signed)
   Subjective:  76 y.o. male presenting today for follow-up evaluation of bilateral ankle pain.  Overall the patient states that he is doing well.  He says the injections helped significantly for several months.    He says that slowly over the past few weeks the pain is returned.   Past Medical History:  Diagnosis Date   Anemia    B 12 DEFICIENCY   Arthritis    Back pain    Diabetes mellitus without complication (HCC)    Hypertension    Prostate enlargement    Sleep apnea    USES C-PAP   Past Surgical History:  Procedure Laterality Date   APPENDECTOMY     BACK SURGERY     GOLD SEED IMPLANT N/A 06/24/2021   Procedure: GOLD SEED IMPLANT;  Surgeon: Newsome, George B, MD;  Location: [facility];  Service: Urology;  Laterality: N/A;   REFRACTIVE SURGERY  2008   bilateral eyes   ROTATOR CUFF REPAIR     RT   SPACE OAR INSTILLATION N/A 06/24/2021   Procedure: SPACE OAR INSTILLATION;  Surgeon: Newsome, George B, MD;  Location: [facility];  Service: Urology;  Laterality: N/A;   TOTAL KNEE ARTHROPLASTY  02/29/2012   Procedure: TOTAL KNEE ARTHROPLASTY;  Surgeon: Frank V Aluisio, MD;  Location: [facility];  Service: Orthopedics;  Laterality: Left;   TOTAL KNEE ARTHROPLASTY Right 09/26/2012   Procedure: RIGHT TOTAL KNEE ARTHROPLASTY;  Surgeon: Frank V Aluisio, MD;  Location: [facility];  Service: Orthopedics;  Laterality: Right;   No Known Allergies   Objective / Physical Exam:  General:  The patient is alert and oriented x3 in no acute distress. Dermatology:  Skin is warm, dry and supple bilateral lower extremities. Negative for open lesions or macerations. Vascular:  Palpable pedal pulses bilaterally. No edema or erythema noted. Capillary refill within normal limits. Neurological:  Epicritic and protective threshold grossly intact bilaterally.  Musculoskeletal Exam:  There continues to be moderate pain on palpation to the anterior lateral medial aspects of the  patient's bilateral ankles. Mild edema noted. Range of motion within normal limits to all pedal and ankle joints bilateral. Muscle strength 5/5 in all groups bilateral.   Radiographic Exam Ankle B/L 04/30/2021:  Normal osseous mineralization. Joint spaces preserved. No fracture/dislocation/boney destruction.    Assessment: 1.  Capsulitis bilateral ankles  Plan of Care:  1. Patient was evaluated. X-Rays reviewed.  2. Injection of 0.5 mL Celestone Soluspan injected in the patient's bilateral ankle joints.  3.  Continue Celebrex 100 mg daily as needed 4.  Continue compression ankle sleeves.  Wear daily.  Patient states that this has been very helpful in the past 5.  Return to clinic as needed    Kemari Narez M. Atalaya Zappia, DPM [facility]  Dr. Naryah Clenney M. Halee Glynn, DPM    [address]                                    [place], [place] [address]                Office [phone]  Fax [phone]  

## 2021-12-24 DIAGNOSIS — E538 Deficiency of other specified B group vitamins: Secondary | ICD-10-CM | POA: Diagnosis not present

## 2021-12-24 DIAGNOSIS — R350 Frequency of micturition: Secondary | ICD-10-CM | POA: Diagnosis not present

## 2021-12-29 ENCOUNTER — Ambulatory Visit: Payer: Medicare Other | Admitting: Podiatry

## 2022-01-01 DIAGNOSIS — G8929 Other chronic pain: Secondary | ICD-10-CM | POA: Diagnosis not present

## 2022-01-01 DIAGNOSIS — I10 Essential (primary) hypertension: Secondary | ICD-10-CM | POA: Diagnosis not present

## 2022-01-01 DIAGNOSIS — E7800 Pure hypercholesterolemia, unspecified: Secondary | ICD-10-CM | POA: Diagnosis not present

## 2022-01-01 DIAGNOSIS — K219 Gastro-esophageal reflux disease without esophagitis: Secondary | ICD-10-CM | POA: Diagnosis not present

## 2022-01-01 DIAGNOSIS — N400 Enlarged prostate without lower urinary tract symptoms: Secondary | ICD-10-CM | POA: Diagnosis not present

## 2022-01-01 DIAGNOSIS — E1169 Type 2 diabetes mellitus with other specified complication: Secondary | ICD-10-CM | POA: Diagnosis not present

## 2022-01-01 DIAGNOSIS — N1831 Chronic kidney disease, stage 3a: Secondary | ICD-10-CM | POA: Diagnosis not present

## 2022-01-26 DIAGNOSIS — E538 Deficiency of other specified B group vitamins: Secondary | ICD-10-CM | POA: Diagnosis not present

## 2022-02-10 DIAGNOSIS — E538 Deficiency of other specified B group vitamins: Secondary | ICD-10-CM | POA: Diagnosis not present

## 2022-02-17 DIAGNOSIS — R202 Paresthesia of skin: Secondary | ICD-10-CM | POA: Diagnosis not present

## 2022-02-17 DIAGNOSIS — L720 Epidermal cyst: Secondary | ICD-10-CM | POA: Diagnosis not present

## 2022-02-17 DIAGNOSIS — L309 Dermatitis, unspecified: Secondary | ICD-10-CM | POA: Diagnosis not present

## 2022-03-18 ENCOUNTER — Ambulatory Visit: Payer: Medicare Other | Admitting: Podiatry

## 2022-03-25 ENCOUNTER — Ambulatory Visit (INDEPENDENT_AMBULATORY_CARE_PROVIDER_SITE_OTHER): Payer: Medicare Other | Admitting: Podiatry

## 2022-03-25 ENCOUNTER — Encounter: Payer: Self-pay | Admitting: Podiatry

## 2022-03-25 VITALS — BP 146/70 | HR 99

## 2022-03-25 DIAGNOSIS — M659 Synovitis and tenosynovitis, unspecified: Secondary | ICD-10-CM

## 2022-03-25 MED ORDER — BETAMETHASONE SOD PHOS & ACET 6 (3-3) MG/ML IJ SUSP
3.0000 mg | Freq: Once | INTRAMUSCULAR | Status: AC
  Administered 2022-03-25: 3 mg via INTRA_ARTICULAR

## 2022-03-25 NOTE — Progress Notes (Signed)
   Subjective:  76 y.o. male presenting today for follow-up evaluation of bilateral ankle pain.  Overall the patient states that he is doing well.  He says the injections helped significantly for several months.    He says that slowly over the past few weeks the pain is returned.   Past Medical History:  Diagnosis Date   Anemia    B 12 DEFICIENCY   Arthritis    Back pain    Diabetes mellitus without complication (HCC)    Hypertension    Prostate enlargement    Sleep apnea    USES C-PAP   Past Surgical History:  Procedure Laterality Date   APPENDECTOMY     BACK SURGERY     GOLD SEED IMPLANT N/A 06/24/2021   Procedure: GOLD SEED IMPLANT;  Surgeon: Remi Haggard, MD;  Location: [facility];  Service: Urology;  Laterality: N/A;   REFRACTIVE SURGERY  2008   bilateral eyes   ROTATOR CUFF REPAIR     RT   SPACE OAR INSTILLATION N/A 06/24/2021   Procedure: SPACE OAR INSTILLATION;  Surgeon: Remi Haggard, MD;  Location: [facility];  Service: Urology;  Laterality: N/A;   TOTAL KNEE ARTHROPLASTY  02/29/2012   Procedure: TOTAL KNEE ARTHROPLASTY;  Surgeon: Gearlean Alf, MD;  Location: [facility];  Service: Orthopedics;  Laterality: Left;   TOTAL KNEE ARTHROPLASTY Right 09/26/2012   Procedure: RIGHT TOTAL KNEE ARTHROPLASTY;  Surgeon: Gearlean Alf, MD;  Location: [facility];  Service: Orthopedics;  Laterality: Right;   No Known Allergies   Objective / Physical Exam:  General:  The patient is alert and oriented x3 in no acute distress. Dermatology:  Skin is warm, dry and supple bilateral lower extremities. Negative for open lesions or macerations. Vascular:  Palpable pedal pulses bilaterally. No edema or erythema noted. Capillary refill within normal limits. Neurological:  Epicritic and protective threshold grossly intact bilaterally.  Musculoskeletal Exam:  There continues to be moderate pain on palpation to the anterior lateral medial aspects of the  patient's bilateral ankles. Mild edema noted. Range of motion within normal limits to all pedal and ankle joints bilateral. Muscle strength 5/5 in all groups bilateral.   Radiographic Exam Ankle B/L 04/30/2021:  Normal osseous mineralization. Joint spaces preserved. No fracture/dislocation/boney destruction.    Assessment: 1.  Capsulitis bilateral ankles  Plan of Care:  1. Patient was evaluated. X-Rays reviewed.  2. Injection of 0.5 mL Celestone Soluspan injected in the patient's bilateral ankle joints.  3.  Continue Celebrex 100 mg daily as needed 4.  Continue compression ankle sleeves.  Wear daily.  Patient states that this has been very helpful in the past 5.  Return to clinic as needed    Edrick Kins, DPM [facility]  Dr. Edrick Kins, DPM    [address], [place] [address]                Office [phone]  Fax [phone]

## 2022-04-07 DIAGNOSIS — E538 Deficiency of other specified B group vitamins: Secondary | ICD-10-CM | POA: Diagnosis not present

## 2022-05-12 DIAGNOSIS — D696 Thrombocytopenia, unspecified: Secondary | ICD-10-CM | POA: Diagnosis not present

## 2022-05-12 DIAGNOSIS — M5416 Radiculopathy, lumbar region: Secondary | ICD-10-CM | POA: Diagnosis not present

## 2022-05-12 DIAGNOSIS — C61 Malignant neoplasm of prostate: Secondary | ICD-10-CM | POA: Diagnosis not present

## 2022-05-12 DIAGNOSIS — G4733 Obstructive sleep apnea (adult) (pediatric): Secondary | ICD-10-CM | POA: Diagnosis not present

## 2022-05-12 DIAGNOSIS — E538 Deficiency of other specified B group vitamins: Secondary | ICD-10-CM | POA: Diagnosis not present

## 2022-05-12 DIAGNOSIS — I700 Atherosclerosis of aorta: Secondary | ICD-10-CM | POA: Diagnosis not present

## 2022-05-12 DIAGNOSIS — E1140 Type 2 diabetes mellitus with diabetic neuropathy, unspecified: Secondary | ICD-10-CM | POA: Diagnosis not present

## 2022-05-12 DIAGNOSIS — E7800 Pure hypercholesterolemia, unspecified: Secondary | ICD-10-CM | POA: Diagnosis not present

## 2022-05-12 DIAGNOSIS — I10 Essential (primary) hypertension: Secondary | ICD-10-CM | POA: Diagnosis not present

## 2022-05-12 DIAGNOSIS — K7581 Nonalcoholic steatohepatitis (NASH): Secondary | ICD-10-CM | POA: Diagnosis not present

## 2022-05-12 DIAGNOSIS — N182 Chronic kidney disease, stage 2 (mild): Secondary | ICD-10-CM | POA: Diagnosis not present

## 2022-05-19 DIAGNOSIS — R3915 Urgency of urination: Secondary | ICD-10-CM | POA: Diagnosis not present

## 2022-05-19 DIAGNOSIS — C61 Malignant neoplasm of prostate: Secondary | ICD-10-CM | POA: Diagnosis not present

## 2022-05-26 DIAGNOSIS — M5416 Radiculopathy, lumbar region: Secondary | ICD-10-CM | POA: Diagnosis not present

## 2022-06-16 DIAGNOSIS — E538 Deficiency of other specified B group vitamins: Secondary | ICD-10-CM | POA: Diagnosis not present

## 2022-06-24 ENCOUNTER — Ambulatory Visit (INDEPENDENT_AMBULATORY_CARE_PROVIDER_SITE_OTHER): Payer: Medicare Other | Admitting: Podiatry

## 2022-06-24 DIAGNOSIS — M659 Synovitis and tenosynovitis, unspecified: Secondary | ICD-10-CM

## 2022-06-24 MED ORDER — BETAMETHASONE SOD PHOS & ACET 6 (3-3) MG/ML IJ SUSP
3.0000 mg | Freq: Once | INTRAMUSCULAR | Status: AC
  Administered 2022-06-24: 3 mg via INTRA_ARTICULAR

## 2022-06-24 NOTE — Progress Notes (Signed)
   Subjective:  77 y.o. male presenting today for follow-up evaluation of bilateral ankle pain.  Overall the patient states that he is doing well.  He says the injections helped significantly for several months.    He says that slowly over the past few weeks the pain is returned.   Past Medical History:  Diagnosis Date   Anemia    B 12 DEFICIENCY   Arthritis    Back pain    Diabetes mellitus without complication (HCC)    Hypertension    Prostate enlargement    Sleep apnea    USES C-PAP   Past Surgical History:  Procedure Laterality Date   APPENDECTOMY     BACK SURGERY     GOLD SEED IMPLANT N/A 06/24/2021   Procedure: GOLD SEED IMPLANT;  Surgeon: Remi Haggard, MD;  Location: [facility];  Service: Urology;  Laterality: N/A;   REFRACTIVE SURGERY  2008   bilateral eyes   ROTATOR CUFF REPAIR     RT   SPACE OAR INSTILLATION N/A 06/24/2021   Procedure: SPACE OAR INSTILLATION;  Surgeon: Remi Haggard, MD;  Location: [facility];  Service: Urology;  Laterality: N/A;   TOTAL KNEE ARTHROPLASTY  02/29/2012   Procedure: TOTAL KNEE ARTHROPLASTY;  Surgeon: Gearlean Alf, MD;  Location: [facility];  Service: Orthopedics;  Laterality: Left;   TOTAL KNEE ARTHROPLASTY Right 09/26/2012   Procedure: RIGHT TOTAL KNEE ARTHROPLASTY;  Surgeon: Gearlean Alf, MD;  Location: [facility];  Service: Orthopedics;  Laterality: Right;   No Known Allergies   Objective / Physical Exam:  General:  The patient is alert and oriented x3 in no acute distress. Dermatology:  Skin is warm, dry and supple bilateral lower extremities. Negative for open lesions or macerations. Vascular:  Palpable pedal pulses bilaterally. No edema or erythema noted. Capillary refill within normal limits. Neurological:  Epicritic and protective threshold grossly intact bilaterally.  Musculoskeletal Exam:  There continues to be moderate pain on palpation to the anterior lateral medial aspects of the  patient's bilateral ankles. Mild edema noted. Range of motion within normal limits to all pedal and ankle joints bilateral. Muscle strength 5/5 in all groups bilateral.   Radiographic Exam Ankle B/L 04/30/2021:  Normal osseous mineralization. Joint spaces preserved. No fracture/dislocation/boney destruction.    Assessment: 1.  Capsulitis bilateral ankles  Plan of Care:  1. Patient was evaluated. X-Rays reviewed.  2. Injection of 0.5 mL Celestone Soluspan injected in the patient's bilateral ankle joints.  3.  Continue Celebrex 100 mg daily as needed 4.  Continue compression ankle sleeves.  Wear daily.  Patient states that this has been very helpful in the past 5.  Return to clinic as needed    Edrick Kins, DPM [facility]  Dr. Edrick Kins, DPM    [address], [place] [address]                Office [phone]  Fax [phone]

## 2022-07-13 DIAGNOSIS — G4733 Obstructive sleep apnea (adult) (pediatric): Secondary | ICD-10-CM | POA: Diagnosis not present

## 2022-07-21 DIAGNOSIS — E538 Deficiency of other specified B group vitamins: Secondary | ICD-10-CM | POA: Diagnosis not present

## 2022-08-24 DIAGNOSIS — E538 Deficiency of other specified B group vitamins: Secondary | ICD-10-CM | POA: Diagnosis not present

## 2022-09-23 ENCOUNTER — Ambulatory Visit (INDEPENDENT_AMBULATORY_CARE_PROVIDER_SITE_OTHER): Payer: Medicare Other | Admitting: Podiatry

## 2022-09-23 DIAGNOSIS — M659 Synovitis and tenosynovitis, unspecified: Secondary | ICD-10-CM

## 2022-09-23 DIAGNOSIS — E119 Type 2 diabetes mellitus without complications: Secondary | ICD-10-CM | POA: Diagnosis not present

## 2022-09-23 MED ORDER — MELOXICAM 15 MG PO TABS: 15.0000 mg | ORAL_TABLET | Freq: Every day | ORAL | 1 refills | Status: AC

## 2022-09-23 MED ORDER — BETAMETHASONE SOD PHOS & ACET 6 (3-3) MG/ML IJ SUSP
3.0000 mg | Freq: Once | INTRAMUSCULAR | Status: AC
  Administered 2022-09-23: 3 mg via INTRA_ARTICULAR

## 2022-09-23 NOTE — Progress Notes (Signed)
Chief Complaint  Patient presents with   Foot Pain    Bilateral ankle pain, injections today     Subjective:  77 y.o. male PMHx T2DM presenting today for follow-up evaluation of bilateral ankle pain.  Overall the patient states that he is doing well.  He says the injections helped significantly for several months.    He says that slowly over the past few weeks the pain is returned.   Past Medical History:  Diagnosis Date   Anemia    B 12 DEFICIENCY   Arthritis    Back pain    Diabetes mellitus without complication (HCC)    Hypertension    Prostate enlargement    Sleep apnea    USES C-PAP   Past Surgical History:  Procedure Laterality Date   APPENDECTOMY     BACK SURGERY     GOLD SEED IMPLANT N/A 06/24/2021   Procedure: GOLD SEED IMPLANT;  Surgeon: Belva Agee, MD;  Location: [facility];  Service: Urology;  Laterality: N/A;   REFRACTIVE SURGERY  2008   bilateral eyes   ROTATOR CUFF REPAIR     RT   SPACE OAR INSTILLATION N/A 06/24/2021   Procedure: SPACE OAR INSTILLATION;  Surgeon: Belva Agee, MD;  Location: [facility];  Service: Urology;  Laterality: N/A;   TOTAL KNEE ARTHROPLASTY  02/29/2012   Procedure: TOTAL KNEE ARTHROPLASTY;  Surgeon: Loanne Drilling, MD;  Location: [facility];  Service: Orthopedics;  Laterality: Left;   TOTAL KNEE ARTHROPLASTY Right 09/26/2012   Procedure: RIGHT TOTAL KNEE ARTHROPLASTY;  Surgeon: Loanne Drilling, MD;  Location: [facility];  Service: Orthopedics;  Laterality: Right;   No Known Allergies   Objective / Physical Exam:  General:  The patient is alert and oriented x3 in no acute distress. Dermatology:  Skin is warm, dry and supple bilateral lower extremities. Negative for open lesions or macerations. Vascular:  Palpable pedal pulses bilaterally. No edema or erythema noted. Capillary refill within normal limits. Neurological:  Epicritic and protective threshold grossly intact bilaterally.   Musculoskeletal Exam:  There continues to be moderate pain on palpation to the anterior lateral medial aspects of the patient's bilateral ankles. Mild edema noted. Range of motion within normal limits to all pedal and ankle joints bilateral. Muscle strength 5/5 in all groups bilateral.   Radiographic Exam Ankle B/L 04/30/2021:  Normal osseous mineralization.  Tibiotalar joint congruent. No fracture/dislocation/boney destruction.    Assessment: 1.  Capsulitis/arthritis bilateral ankles  Plan of Care:  1. Patient was evaluated.  Comprehensive diabetic foot exam performed today 2. Injection of 0.5 mL Celestone Soluspan injected in the patient's bilateral ankle joints.  3.  Patient states that he does not get much improvement with the Celebrex.  He would like to try meloxicam that he has had in the past.  Prescription for meloxicam 15 mg daily as needed 4.  Continue compression ankle sleeves.  Wear daily.  Patient states that this has been very helpful in the past 5.  Return to clinic as needed    Felecia Shelling, DPM [facility]  Dr. Felecia Shelling, DPM    [address], [place] [address]  Office [phone]  Fax [phone]

## 2022-09-29 DIAGNOSIS — E538 Deficiency of other specified B group vitamins: Secondary | ICD-10-CM | POA: Diagnosis not present

## 2022-10-27 DIAGNOSIS — M549 Dorsalgia, unspecified: Secondary | ICD-10-CM | POA: Diagnosis not present

## 2022-10-27 DIAGNOSIS — R109 Unspecified abdominal pain: Secondary | ICD-10-CM | POA: Diagnosis not present

## 2022-11-04 DIAGNOSIS — R160 Hepatomegaly, not elsewhere classified: Secondary | ICD-10-CM | POA: Diagnosis not present

## 2022-11-04 DIAGNOSIS — K760 Fatty (change of) liver, not elsewhere classified: Secondary | ICD-10-CM | POA: Diagnosis not present

## 2022-11-04 DIAGNOSIS — R109 Unspecified abdominal pain: Secondary | ICD-10-CM | POA: Diagnosis not present

## 2022-11-04 DIAGNOSIS — E538 Deficiency of other specified B group vitamins: Secondary | ICD-10-CM | POA: Diagnosis not present

## 2022-11-10 DIAGNOSIS — I10 Essential (primary) hypertension: Secondary | ICD-10-CM | POA: Diagnosis not present

## 2022-11-10 DIAGNOSIS — N401 Enlarged prostate with lower urinary tract symptoms: Secondary | ICD-10-CM | POA: Diagnosis not present

## 2022-11-10 DIAGNOSIS — M109 Gout, unspecified: Secondary | ICD-10-CM | POA: Diagnosis not present

## 2022-11-10 DIAGNOSIS — I700 Atherosclerosis of aorta: Secondary | ICD-10-CM | POA: Diagnosis not present

## 2022-11-10 DIAGNOSIS — E7800 Pure hypercholesterolemia, unspecified: Secondary | ICD-10-CM | POA: Diagnosis not present

## 2022-11-10 DIAGNOSIS — D696 Thrombocytopenia, unspecified: Secondary | ICD-10-CM | POA: Diagnosis not present

## 2022-11-10 DIAGNOSIS — E538 Deficiency of other specified B group vitamins: Secondary | ICD-10-CM | POA: Diagnosis not present

## 2022-11-10 DIAGNOSIS — G4730 Sleep apnea, unspecified: Secondary | ICD-10-CM | POA: Diagnosis not present

## 2022-11-10 DIAGNOSIS — K760 Fatty (change of) liver, not elsewhere classified: Secondary | ICD-10-CM | POA: Diagnosis not present

## 2022-11-10 DIAGNOSIS — E1140 Type 2 diabetes mellitus with diabetic neuropathy, unspecified: Secondary | ICD-10-CM | POA: Diagnosis not present

## 2022-11-10 DIAGNOSIS — Z0000 Encounter for general adult medical examination without abnormal findings: Secondary | ICD-10-CM | POA: Diagnosis not present

## 2022-11-10 DIAGNOSIS — C61 Malignant neoplasm of prostate: Secondary | ICD-10-CM | POA: Diagnosis not present

## 2022-11-10 DIAGNOSIS — G4733 Obstructive sleep apnea (adult) (pediatric): Secondary | ICD-10-CM | POA: Diagnosis not present

## 2022-11-24 DIAGNOSIS — N3000 Acute cystitis without hematuria: Secondary | ICD-10-CM | POA: Diagnosis not present

## 2022-11-24 DIAGNOSIS — R3915 Urgency of urination: Secondary | ICD-10-CM | POA: Diagnosis not present

## 2022-11-24 DIAGNOSIS — C61 Malignant neoplasm of prostate: Secondary | ICD-10-CM | POA: Diagnosis not present

## 2022-12-08 DIAGNOSIS — E538 Deficiency of other specified B group vitamins: Secondary | ICD-10-CM | POA: Diagnosis not present

## 2022-12-30 ENCOUNTER — Ambulatory Visit (INDEPENDENT_AMBULATORY_CARE_PROVIDER_SITE_OTHER): Payer: Medicare Other | Admitting: Podiatry

## 2022-12-30 DIAGNOSIS — M659 Synovitis and tenosynovitis, unspecified: Secondary | ICD-10-CM | POA: Diagnosis not present

## 2022-12-30 MED ORDER — BETAMETHASONE SOD PHOS & ACET 6 (3-3) MG/ML IJ SUSP
3.0000 mg | Freq: Once | INTRAMUSCULAR | Status: AC
  Administered 2022-12-30: 3 mg via INTRA_ARTICULAR

## 2022-12-30 NOTE — Progress Notes (Signed)
   Chief Complaint  Patient presents with   Foot Pain    Bilateral ankle pain, pt presents to get injections today.        Subjective:  78 y.o. 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  Patient gets relief from ankle joint injections.  He says the meloxicam helps minimally.  He also wears his compression ankle sleeves daily   Past Medical History:  Diagnosis Date   Anemia    B 12 DEFICIENCY   Arthritis    Back pain    Diabetes mellitus without complication (HCC)    Hypertension    Prostate enlargement    Sleep apnea    USES C-PAP   Past Surgical History:  Procedure Laterality Date   APPENDECTOMY     BACK SURGERY     GOLD SEED IMPLANT N/A 06/24/2021   Procedure: GOLD SEED IMPLANT;  Surgeon: Belva Agee, MD;  Location: [facility];  Service: Urology;  Laterality: N/A;   REFRACTIVE SURGERY  2008   bilateral eyes   ROTATOR CUFF REPAIR     RT   SPACE OAR INSTILLATION N/A 06/24/2021   Procedure: SPACE OAR INSTILLATION;  Surgeon: Belva Agee, MD;  Location: [facility];  Service: Urology;  Laterality: N/A;   TOTAL KNEE ARTHROPLASTY  02/29/2012   Procedure: TOTAL KNEE ARTHROPLASTY;  Surgeon: Loanne Drilling, MD;  Location: [facility];  Service: Orthopedics;  Laterality: Left;   TOTAL KNEE ARTHROPLASTY Right 09/26/2012   Procedure: RIGHT TOTAL KNEE ARTHROPLASTY;  Surgeon: Loanne Drilling, MD;  Location: [facility];  Service: Orthopedics;  Laterality: Right;   No Known Allergies   Objective / Physical Exam:  General:  The patient is alert and oriented x3 in no acute distress. Dermatology:  Skin is warm, dry and supple bilateral lower extremities. Negative for open lesions or macerations. Vascular:  Palpable pedal pulses bilaterally. No edema or erythema noted. Capillary refill within normal limits. Neurological:  Epicritic and protective threshold grossly intact  bilaterally.  Musculoskeletal Exam:  Chronic pain on palpation to the anterior lateral medial aspects of the patient's bilateral ankles. Mild edema noted. Range of motion within normal limits to all pedal and ankle joints bilateral. Muscle strength 5/5 in all groups bilateral.   Radiographic Exam Ankle B/L 04/30/2021:  Normal osseous mineralization.  Tibiotalar joint congruent. No fracture/dislocation/boney destruction.    Assessment: 1.  Capsulitis/arthritis bilateral ankles  Plan of Care:  -Patient was evaluated.   -Injection of 0.5 mL Celestone Soluspan injected in the patient's bilateral ankle joints.  -Continue meloxicam 15 mg daily as needed -Continue compression ankle sleeves.  Wear daily.  Patient states that this has been very helpful in the past -Return to clinic as needed    Felecia Shelling, DPM [facility]  Dr. Felecia Shelling, DPM    [address] [place], [place] [address]                Office [phone]  Fax [phone]

## 2023-01-12 DIAGNOSIS — E538 Deficiency of other specified B group vitamins: Secondary | ICD-10-CM | POA: Diagnosis not present

## 2023-01-12 DIAGNOSIS — Z23 Encounter for immunization: Secondary | ICD-10-CM | POA: Diagnosis not present

## 2023-01-19 DIAGNOSIS — M48061 Spinal stenosis, lumbar region without neurogenic claudication: Secondary | ICD-10-CM | POA: Diagnosis not present

## 2023-01-19 DIAGNOSIS — M5412 Radiculopathy, cervical region: Secondary | ICD-10-CM | POA: Diagnosis not present

## 2023-01-19 DIAGNOSIS — Z6841 Body Mass Index (BMI) 40.0 and over, adult: Secondary | ICD-10-CM | POA: Diagnosis not present

## 2023-01-20 ENCOUNTER — Other Ambulatory Visit: Payer: Self-pay | Admitting: Neurological Surgery

## 2023-01-20 DIAGNOSIS — M5412 Radiculopathy, cervical region: Secondary | ICD-10-CM

## 2023-01-20 DIAGNOSIS — M48061 Spinal stenosis, lumbar region without neurogenic claudication: Secondary | ICD-10-CM

## 2023-01-23 ENCOUNTER — Other Ambulatory Visit: Payer: Self-pay | Admitting: Podiatry

## 2023-01-25 ENCOUNTER — Other Ambulatory Visit: Payer: Self-pay | Admitting: Physician Assistant

## 2023-01-25 ENCOUNTER — Ambulatory Visit
Admission: RE | Admit: 2023-01-25 | Discharge: 2023-01-25 | Disposition: A | Discharge status: Home / Self Care | Payer: Medicare Other | Source: Ambulatory Visit | Attending: Physician Assistant | Admitting: Physician Assistant

## 2023-01-25 DIAGNOSIS — R0781 Pleurodynia: Secondary | ICD-10-CM

## 2023-02-17 ENCOUNTER — Ambulatory Visit
Admission: RE | Admit: 2023-02-17 | Discharge: 2023-02-17 | Disposition: A | Discharge status: Home / Self Care | Payer: Medicare Other | Source: Ambulatory Visit | Attending: Neurological Surgery | Admitting: Neurological Surgery

## 2023-02-17 DIAGNOSIS — M47812 Spondylosis without myelopathy or radiculopathy, cervical region: Secondary | ICD-10-CM | POA: Diagnosis not present

## 2023-02-17 DIAGNOSIS — M4802 Spinal stenosis, cervical region: Secondary | ICD-10-CM | POA: Diagnosis not present

## 2023-02-17 DIAGNOSIS — M47816 Spondylosis without myelopathy or radiculopathy, lumbar region: Secondary | ICD-10-CM | POA: Diagnosis not present

## 2023-02-17 DIAGNOSIS — M5412 Radiculopathy, cervical region: Secondary | ICD-10-CM

## 2023-02-17 DIAGNOSIS — M5126 Other intervertebral disc displacement, lumbar region: Secondary | ICD-10-CM | POA: Diagnosis not present

## 2023-02-17 DIAGNOSIS — E538 Deficiency of other specified B group vitamins: Secondary | ICD-10-CM | POA: Diagnosis not present

## 2023-02-17 DIAGNOSIS — M4316 Spondylolisthesis, lumbar region: Secondary | ICD-10-CM | POA: Diagnosis not present

## 2023-02-17 DIAGNOSIS — M48061 Spinal stenosis, lumbar region without neurogenic claudication: Secondary | ICD-10-CM

## 2023-03-22 ENCOUNTER — Other Ambulatory Visit: Payer: Self-pay | Admitting: Podiatry

## 2023-03-23 DIAGNOSIS — D649 Anemia, unspecified: Secondary | ICD-10-CM | POA: Diagnosis not present

## 2023-03-23 DIAGNOSIS — D696 Thrombocytopenia, unspecified: Secondary | ICD-10-CM | POA: Diagnosis not present

## 2023-03-23 DIAGNOSIS — I700 Atherosclerosis of aorta: Secondary | ICD-10-CM | POA: Diagnosis not present

## 2023-03-23 DIAGNOSIS — M519 Unspecified thoracic, thoracolumbar and lumbosacral intervertebral disc disorder: Secondary | ICD-10-CM | POA: Diagnosis not present

## 2023-03-23 DIAGNOSIS — E1140 Type 2 diabetes mellitus with diabetic neuropathy, unspecified: Secondary | ICD-10-CM | POA: Diagnosis not present

## 2023-03-23 DIAGNOSIS — C61 Malignant neoplasm of prostate: Secondary | ICD-10-CM | POA: Diagnosis not present

## 2023-03-23 DIAGNOSIS — E119 Type 2 diabetes mellitus without complications: Secondary | ICD-10-CM | POA: Diagnosis not present

## 2023-03-23 DIAGNOSIS — I10 Essential (primary) hypertension: Secondary | ICD-10-CM | POA: Diagnosis not present

## 2023-03-23 DIAGNOSIS — E538 Deficiency of other specified B group vitamins: Secondary | ICD-10-CM | POA: Diagnosis not present

## 2023-03-23 DIAGNOSIS — G4733 Obstructive sleep apnea (adult) (pediatric): Secondary | ICD-10-CM | POA: Diagnosis not present

## 2023-03-23 DIAGNOSIS — M109 Gout, unspecified: Secondary | ICD-10-CM | POA: Diagnosis not present

## 2023-03-23 DIAGNOSIS — N182 Chronic kidney disease, stage 2 (mild): Secondary | ICD-10-CM | POA: Diagnosis not present

## 2023-03-30 DIAGNOSIS — Z6841 Body Mass Index (BMI) 40.0 and over, adult: Secondary | ICD-10-CM | POA: Diagnosis not present

## 2023-03-30 DIAGNOSIS — R200 Anesthesia of skin: Secondary | ICD-10-CM | POA: Diagnosis not present

## 2023-03-31 ENCOUNTER — Encounter: Payer: Self-pay | Admitting: Podiatry

## 2023-03-31 ENCOUNTER — Ambulatory Visit (INDEPENDENT_AMBULATORY_CARE_PROVIDER_SITE_OTHER): Payer: Medicare Other | Admitting: Podiatry

## 2023-03-31 DIAGNOSIS — M65972 Unspecified synovitis and tenosynovitis, left ankle and foot: Secondary | ICD-10-CM | POA: Diagnosis not present

## 2023-03-31 DIAGNOSIS — M65971 Unspecified synovitis and tenosynovitis, right ankle and foot: Secondary | ICD-10-CM

## 2023-03-31 MED ORDER — BETAMETHASONE SOD PHOS & ACET 6 (3-3) MG/ML IJ SUSP
3.0000 mg | Freq: Once | INTRAMUSCULAR | Status: AC
  Administered 2023-03-31: 3 mg via INTRA_ARTICULAR

## 2023-03-31 NOTE — Progress Notes (Signed)
   Chief Complaint  Patient presents with   Foot Pain    RM#6 Bilateral ankle injections for pain treatment.    Subjective:  77 y.o. 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  Patient gets relief from ankle joint injections.  He says the meloxicam helps minimally.  He also wears his compression ankle sleeves daily   Past Medical History:  Diagnosis Date   Anemia    B 12 DEFICIENCY   Arthritis    Back pain    Diabetes mellitus without complication (HCC)    Hypertension    Prostate enlargement    Sleep apnea    USES C-PAP   Past Surgical History:  Procedure Laterality Date   APPENDECTOMY     BACK SURGERY     GOLD SEED IMPLANT N/A 06/24/2021   Procedure: GOLD SEED IMPLANT;  Surgeon: Belva Agee, MD;  Location: [facility]  Service: Urology;  Laterality: N/A;   REFRACTIVE SURGERY  2008   bilateral eyes   ROTATOR CUFF REPAIR     RT   SPACE OAR INSTILLATION N/A 06/24/2021   Procedure: SPACE OAR INSTILLATION;  Surgeon: Belva Agee, MD;  Location: [facility];  Service: Urology;  Laterality: N/A;   TOTAL KNEE ARTHROPLASTY  02/29/2012   Procedure: TOTAL KNEE ARTHROPLASTY;  Surgeon: Loanne Drilling, MD;  Location: [facility];  Service: Orthopedics;  Laterality: Left;   TOTAL KNEE ARTHROPLASTY Right 09/26/2012   Procedure: RIGHT TOTAL KNEE ARTHROPLASTY;  Surgeon: Loanne Drilling, MD;  Location: [facility];  Service: Orthopedics;  Laterality: Right;   No Known Allergies   Objective / Physical Exam:  General:  The patient is alert and oriented x3 in no acute distress. Dermatology:  Skin is warm, dry and supple bilateral lower extremities. Negative for open lesions or macerations. Vascular:  Palpable pedal pulses bilaterally. No edema or erythema noted. Capillary refill within normal limits. Neurological:  Epicritic and protective threshold grossly intact bilaterally.   Musculoskeletal Exam:  Chronic pain on palpation to the anterior lateral medial aspects of the patient's bilateral ankles. Mild edema noted. Range of motion within normal limits to all pedal and ankle joints bilateral. Muscle strength 5/5 in all groups bilateral.   Radiographic Exam Ankle B/L 04/30/2021:  Normal osseous mineralization.  Tibiotalar joint congruent. No fracture/dislocation/boney destruction.    Assessment: 1.  Capsulitis/arthritis bilateral ankles  Plan of Care:  -Patient was evaluated.   -Injection of 0.5 mL Celestone Soluspan injected in the patient's bilateral ankle joints.  -Continue meloxicam 15 mg daily as needed -Continue compression ankle sleeves.  Wear daily.  Patient states that this has been very helpful in the past -Return to clinic as needed    Felecia Shelling, DPM [facility]  Dr. Felecia Shelling, DPM    [address] [place], [place] [address]                Office [phone]  Fax [phone]

## 2023-04-20 DIAGNOSIS — G5603 Carpal tunnel syndrome, bilateral upper limbs: Secondary | ICD-10-CM | POA: Diagnosis not present

## 2023-04-20 DIAGNOSIS — G5621 Lesion of ulnar nerve, right upper limb: Secondary | ICD-10-CM | POA: Diagnosis not present

## 2023-04-27 DIAGNOSIS — E538 Deficiency of other specified B group vitamins: Secondary | ICD-10-CM | POA: Diagnosis not present

## 2023-05-20 DIAGNOSIS — G5603 Carpal tunnel syndrome, bilateral upper limbs: Secondary | ICD-10-CM | POA: Diagnosis not present

## 2023-05-20 DIAGNOSIS — Z6841 Body Mass Index (BMI) 40.0 and over, adult: Secondary | ICD-10-CM | POA: Diagnosis not present

## 2023-05-21 DIAGNOSIS — R300 Dysuria: Secondary | ICD-10-CM | POA: Diagnosis not present

## 2023-06-01 DIAGNOSIS — N390 Urinary tract infection, site not specified: Secondary | ICD-10-CM | POA: Diagnosis not present

## 2023-06-01 DIAGNOSIS — E538 Deficiency of other specified B group vitamins: Secondary | ICD-10-CM | POA: Diagnosis not present

## 2023-06-01 DIAGNOSIS — H6982 Other specified disorders of Eustachian tube, left ear: Secondary | ICD-10-CM | POA: Diagnosis not present

## 2023-06-01 DIAGNOSIS — M109 Gout, unspecified: Secondary | ICD-10-CM | POA: Diagnosis not present

## 2023-06-21 DIAGNOSIS — C61 Malignant neoplasm of prostate: Secondary | ICD-10-CM | POA: Diagnosis not present

## 2023-06-28 DIAGNOSIS — R3915 Urgency of urination: Secondary | ICD-10-CM | POA: Diagnosis not present

## 2023-06-28 DIAGNOSIS — C61 Malignant neoplasm of prostate: Secondary | ICD-10-CM | POA: Diagnosis not present

## 2023-06-30 ENCOUNTER — Encounter: Payer: Self-pay | Admitting: Podiatry

## 2023-06-30 ENCOUNTER — Ambulatory Visit (INDEPENDENT_AMBULATORY_CARE_PROVIDER_SITE_OTHER): Payer: Medicare Other | Admitting: Podiatry

## 2023-06-30 DIAGNOSIS — M19072 Primary osteoarthritis, left ankle and foot: Secondary | ICD-10-CM

## 2023-06-30 DIAGNOSIS — M19071 Primary osteoarthritis, right ankle and foot: Secondary | ICD-10-CM | POA: Diagnosis not present

## 2023-06-30 MED ORDER — MELOXICAM 15 MG PO TABS: 15.0000 mg | ORAL_TABLET | Freq: Every day | ORAL | 0 refills | Status: DC

## 2023-06-30 MED ORDER — BETAMETHASONE SOD PHOS & ACET 6 (3-3) MG/ML IJ SUSP
3.0000 mg | Freq: Once | INTRAMUSCULAR | Status: AC
  Administered 2023-06-30: 3 mg via INTRA_ARTICULAR

## 2023-06-30 NOTE — Progress Notes (Signed)
   Chief Complaint  Patient presents with   Follow-up    Patient states he has been in pain and is here for his injections,      Subjective:  78 y.o. 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  Patient gets relief from ankle joint injections.  He says the meloxicam helps minimally.  He also wears his compression ankle sleeves daily   Past Medical History:  Diagnosis Date   Anemia    B 12 DEFICIENCY   Arthritis    Back pain    Diabetes mellitus without complication (HCC)    Hypertension    Prostate enlargement    Sleep apnea    USES C-PAP   Past Surgical History:  Procedure Laterality Date   APPENDECTOMY     BACK SURGERY     GOLD SEED IMPLANT N/A 06/24/2021   Procedure: GOLD SEED IMPLANT;  Surgeon: Belva Agee, MD;  Location: [facility];  Service: Urology;  Laterality: N/A;   REFRACTIVE SURGERY  2008   bilateral eyes   ROTATOR CUFF REPAIR     RT   SPACE OAR INSTILLATION N/A 06/24/2021   Procedure: SPACE OAR INSTILLATION;  Surgeon: Belva Agee, MD;  Location: [facility];  Service: Urology;  Laterality: N/A;   TOTAL KNEE ARTHROPLASTY  02/29/2012   Procedure: TOTAL KNEE ARTHROPLASTY;  Surgeon: Loanne Drilling, MD;  [facility];  Service: Orthopedics;  Laterality: Left;   TOTAL KNEE ARTHROPLASTY Right 09/26/2012   Procedure: RIGHT TOTAL KNEE ARTHROPLASTY;  Surgeon: Loanne Drilling, MD;  Location[facility];  Service: Orthopedics;  Laterality: Right;   No Known Allergies   Objective / Physical Exam:  General:  The patient is alert and oriented x3 in no acute distress. Dermatology:  Skin is warm, dry and supple bilateral lower extremities. Negative for open lesions or macerations. Vascular:  Palpable pedal pulses bilaterally. No edema or erythema noted. Capillary refill within normal limits. Neurological:  Epicritic and protective threshold grossly  intact bilaterally.  Musculoskeletal Exam:  Chronic pain on palpation to the anterior lateral medial aspects of the patient's bilateral ankles. Mild edema noted. Range of motion within normal limits to all pedal and ankle joints bilateral. Muscle strength 5/5 in all groups bilateral.   Radiographic Exam Ankle B/L 04/30/2021:  Normal osseous mineralization.  Tibiotalar joint congruent. No fracture/dislocation/boney destruction.    Assessment: 1.  Capsulitis/arthritis bilateral ankles  Plan of Care:  -Patient was evaluated.   -Injection of 0.5 mL Celestone Soluspan injected in the patient's bilateral ankle joints.  -Continue meloxicam 15 mg daily as needed -Continue compression ankle sleeves.  Wear daily.  Patient states that this has been very helpful in the past -Return to clinic as needed    Felecia Shelling, DPM [facility]  Dr. Felecia Shelling, DPM    [address] [place], [place] [address]                Office [phone]  Fax [phone]

## 2023-07-06 DIAGNOSIS — E538 Deficiency of other specified B group vitamins: Secondary | ICD-10-CM | POA: Diagnosis not present

## 2023-07-09 DIAGNOSIS — E119 Type 2 diabetes mellitus without complications: Secondary | ICD-10-CM | POA: Diagnosis not present

## 2023-07-09 DIAGNOSIS — I700 Atherosclerosis of aorta: Secondary | ICD-10-CM | POA: Diagnosis not present

## 2023-07-09 DIAGNOSIS — N182 Chronic kidney disease, stage 2 (mild): Secondary | ICD-10-CM | POA: Diagnosis not present

## 2023-07-09 DIAGNOSIS — I10 Essential (primary) hypertension: Secondary | ICD-10-CM | POA: Diagnosis not present

## 2023-07-13 ENCOUNTER — Telehealth: Payer: Self-pay | Admitting: Internal Medicine

## 2023-07-13 DIAGNOSIS — I10 Essential (primary) hypertension: Secondary | ICD-10-CM | POA: Diagnosis not present

## 2023-07-13 DIAGNOSIS — G4733 Obstructive sleep apnea (adult) (pediatric): Secondary | ICD-10-CM | POA: Diagnosis not present

## 2023-07-13 NOTE — Telephone Encounter (Signed)
 Copied from CRM [id number]. Topic: General - Other >> Jul 13, 2023  9:48 AM Eunice Blase wrote: Reason for CRM: Received call from [employer] per Josh ph: [phone] , fax: [phone] faxed request for pt's CPAP supplies on 07/08/2023. Please complete, sign and fax.

## 2023-07-19 DIAGNOSIS — N182 Chronic kidney disease, stage 2 (mild): Secondary | ICD-10-CM | POA: Diagnosis not present

## 2023-07-19 DIAGNOSIS — I10 Essential (primary) hypertension: Secondary | ICD-10-CM | POA: Diagnosis not present

## 2023-07-19 DIAGNOSIS — E119 Type 2 diabetes mellitus without complications: Secondary | ICD-10-CM | POA: Diagnosis not present

## 2023-07-19 DIAGNOSIS — E7800 Pure hypercholesterolemia, unspecified: Secondary | ICD-10-CM | POA: Diagnosis not present

## 2023-07-19 DIAGNOSIS — I700 Atherosclerosis of aorta: Secondary | ICD-10-CM | POA: Diagnosis not present

## 2023-07-19 DIAGNOSIS — C61 Malignant neoplasm of prostate: Secondary | ICD-10-CM | POA: Diagnosis not present

## 2023-08-07 DIAGNOSIS — I700 Atherosclerosis of aorta: Secondary | ICD-10-CM | POA: Diagnosis not present

## 2023-08-07 DIAGNOSIS — N182 Chronic kidney disease, stage 2 (mild): Secondary | ICD-10-CM | POA: Diagnosis not present

## 2023-08-07 DIAGNOSIS — E119 Type 2 diabetes mellitus without complications: Secondary | ICD-10-CM | POA: Diagnosis not present

## 2023-08-07 DIAGNOSIS — I10 Essential (primary) hypertension: Secondary | ICD-10-CM | POA: Diagnosis not present

## 2023-08-10 DIAGNOSIS — E538 Deficiency of other specified B group vitamins: Secondary | ICD-10-CM | POA: Diagnosis not present

## 2023-08-11 ENCOUNTER — Encounter: Payer: Self-pay | Admitting: Podiatry

## 2023-08-11 ENCOUNTER — Ambulatory Visit (INDEPENDENT_AMBULATORY_CARE_PROVIDER_SITE_OTHER): Admitting: Podiatry

## 2023-08-11 ENCOUNTER — Ambulatory Visit (INDEPENDENT_AMBULATORY_CARE_PROVIDER_SITE_OTHER)

## 2023-08-11 DIAGNOSIS — M19071 Primary osteoarthritis, right ankle and foot: Secondary | ICD-10-CM

## 2023-08-11 DIAGNOSIS — M19072 Primary osteoarthritis, left ankle and foot: Secondary | ICD-10-CM

## 2023-08-11 NOTE — Progress Notes (Signed)
 Chief Complaint  Patient presents with   Arthritis    Patient is here for F/U for arthritis of right foot Pain started to flare up for the last few days    Subjective:  78 y.o. 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  Patient continues to have pain and tenderness to the ankles chronically and injections only helped temporarily.  Presenting for further treatment evaluation   Past Medical History:  Diagnosis Date   Anemia    B 12 DEFICIENCY   Arthritis    Back pain    Diabetes mellitus without complication (HCC)    Hypertension    Prostate enlargement    Sleep apnea    USES C-PAP   Past Surgical History:  Procedure Laterality Date   APPENDECTOMY     BACK SURGERY     GOLD SEED IMPLANT N/A 06/24/2021   Procedure: GOLD SEED IMPLANT;  Surgeon: Sherlyn Ditto, MD;  Location: [facility];  Service: Urology;  Laterality: N/A;   REFRACTIVE SURGERY  2008   bilateral eyes   ROTATOR CUFF REPAIR     RT   SPACE OAR INSTILLATION N/A 06/24/2021   Procedure: SPACE OAR INSTILLATION;  Surgeon: Sherlyn Ditto, MD;  Location: [facility];  Service: Urology;  Laterality: N/A;   TOTAL KNEE ARTHROPLASTY  02/29/2012   Procedure: TOTAL KNEE ARTHROPLASTY;  Surgeon: Aurther Blue, MD;  [facility];  Service: Orthopedics;  Laterality: Left;   TOTAL KNEE ARTHROPLASTY Right 09/26/2012   Procedure: RIGHT TOTAL KNEE ARTHROPLASTY;  Surgeon: Aurther Blue, MD;  [facility];  Service: Orthopedics;  Laterality: Right;   No Known Allergies   Objective / Physical Exam:  General:  The patient is alert and oriented x3 in no acute distress. Dermatology:  Skin is warm, dry and supple bilateral lower extremities. Negative for open lesions or macerations. Vascular:  Palpable pedal pulses bilaterally. No edema or erythema noted. Capillary refill within normal limits. Neurological:   Grossly intact via light touch Musculoskeletal Exam:  Chronic pain on palpation to the anterior lateral medial aspects of the patient's bilateral ankles. Mild edema noted. Range of motion within normal limits to all pedal and ankle joints bilateral. Muscle strength 5/5 in all groups bilateral.   Radiographic Exam Ankle B/L 08/11/2023:  Normal osseous mineralization.  Tibiotalar joint congruent. No fracture/dislocation/boney destruction.    Assessment: 1.  Capsulitis/arthritis bilateral ankles  Plan of Care:  -Patient was evaluated.  X-rays reviewed in detail today with the patient - Unfortunately patient continues to pursue conservative care but only gets temporary relief.  Today we did discuss the possibility of ankle arthroscopy to visualize the tibiotalar joint and potentially alleviate some of his arthritic symptoms.  The risk benefits advantages disadvantages the procedure were explained in great length in detail to the patient.  Postoperative recovery course was also explained.  He understands that arthroscopy will not cure the arthritis within the ankle joint however it may provide relief with debridement and prolong a more aggressive surgery.  All patient questions were answered.  No guarantees were expressed or implied.  He would like to proceed with surgery at this time -Authorization for surgery was initiated today.  Surgery will consist of arthroscopy bilateral -Continue meloxicam  15 mg daily as needed -Continue compression ankle sleeves.  Wear daily.  Patient states that this has been very helpful in the past -Return to clinic  1 week postop   Dot Gazella, DPM [facility]  Dr. Dot Gazella, DPM    [address] [place], [place] [address]                Office [phone]  Fax [phone]

## 2023-08-14 ENCOUNTER — Other Ambulatory Visit: Payer: Self-pay | Admitting: Podiatry

## 2023-08-17 ENCOUNTER — Telehealth: Payer: Self-pay | Admitting: Podiatry

## 2023-08-17 DIAGNOSIS — M25511 Pain in right shoulder: Secondary | ICD-10-CM | POA: Diagnosis not present

## 2023-08-17 DIAGNOSIS — Z6841 Body Mass Index (BMI) 40.0 and over, adult: Secondary | ICD-10-CM | POA: Diagnosis not present

## 2023-08-17 NOTE — Telephone Encounter (Signed)
 Pt called back and is scheduled for surgery on both ankles and he is asking if he needs a walker. Also asking if he would be ok as he lives by himself.

## 2023-08-17 NOTE — Telephone Encounter (Signed)
 PT LEFT MESSAGE STATING HE HAS QUESTIONS ABOUT UPCOMING SURGERY.  I returned call and left message for pt to either call me back or send message thru mychart to provider.

## 2023-08-18 DIAGNOSIS — N182 Chronic kidney disease, stage 2 (mild): Secondary | ICD-10-CM | POA: Diagnosis not present

## 2023-08-18 DIAGNOSIS — I10 Essential (primary) hypertension: Secondary | ICD-10-CM | POA: Diagnosis not present

## 2023-08-18 DIAGNOSIS — E119 Type 2 diabetes mellitus without complications: Secondary | ICD-10-CM | POA: Diagnosis not present

## 2023-08-18 DIAGNOSIS — E7800 Pure hypercholesterolemia, unspecified: Secondary | ICD-10-CM | POA: Diagnosis not present

## 2023-08-18 DIAGNOSIS — C61 Malignant neoplasm of prostate: Secondary | ICD-10-CM | POA: Diagnosis not present

## 2023-08-18 DIAGNOSIS — I700 Atherosclerosis of aorta: Secondary | ICD-10-CM | POA: Diagnosis not present

## 2023-08-18 NOTE — Telephone Encounter (Signed)
 Pt called back again asking about his surgery will he need a walker. Also asking would he be ok since he lives alone?

## 2023-08-19 ENCOUNTER — Other Ambulatory Visit: Payer: Self-pay | Admitting: Podiatry

## 2023-08-19 DIAGNOSIS — M19072 Primary osteoarthritis, left ankle and foot: Secondary | ICD-10-CM

## 2023-08-19 DIAGNOSIS — M19071 Primary osteoarthritis, right ankle and foot: Secondary | ICD-10-CM

## 2023-08-19 NOTE — Telephone Encounter (Signed)
 Pt called back and left a message he missed the call.  I gave him the information from Dr Luster Salters and he does have access to a wheelchair so does not need the prescription.

## 2023-08-26 ENCOUNTER — Other Ambulatory Visit: Payer: Self-pay | Admitting: Podiatry

## 2023-08-26 DIAGNOSIS — M19071 Primary osteoarthritis, right ankle and foot: Secondary | ICD-10-CM | POA: Diagnosis not present

## 2023-08-26 DIAGNOSIS — M19072 Primary osteoarthritis, left ankle and foot: Secondary | ICD-10-CM | POA: Diagnosis not present

## 2023-08-26 DIAGNOSIS — M93972 Osteochondropathy, unspecified, left ankle and foot: Secondary | ICD-10-CM | POA: Diagnosis not present

## 2023-08-26 DIAGNOSIS — M25871 Other specified joint disorders, right ankle and foot: Secondary | ICD-10-CM | POA: Diagnosis not present

## 2023-08-26 DIAGNOSIS — M65972 Unspecified synovitis and tenosynovitis, left ankle and foot: Secondary | ICD-10-CM | POA: Diagnosis not present

## 2023-08-26 DIAGNOSIS — M93971 Osteochondropathy, unspecified, right ankle and foot: Secondary | ICD-10-CM | POA: Diagnosis not present

## 2023-08-26 DIAGNOSIS — M65971 Unspecified synovitis and tenosynovitis, right ankle and foot: Secondary | ICD-10-CM | POA: Diagnosis not present

## 2023-08-26 DIAGNOSIS — M25872 Other specified joint disorders, left ankle and foot: Secondary | ICD-10-CM | POA: Diagnosis not present

## 2023-08-26 MED ORDER — HYDROCODONE-ACETAMINOPHEN 10-325 MG PO TABS: 1.0000 | ORAL_TABLET | ORAL | 0 refills | Status: AC | PRN

## 2023-08-26 NOTE — Progress Notes (Signed)
 PRN postop

## 2023-09-01 ENCOUNTER — Ambulatory Visit (INDEPENDENT_AMBULATORY_CARE_PROVIDER_SITE_OTHER)

## 2023-09-01 ENCOUNTER — Ambulatory Visit (INDEPENDENT_AMBULATORY_CARE_PROVIDER_SITE_OTHER): Admitting: Podiatry

## 2023-09-01 ENCOUNTER — Ambulatory Visit

## 2023-09-01 ENCOUNTER — Encounter: Payer: Self-pay | Admitting: Podiatry

## 2023-09-01 DIAGNOSIS — M05771 Rheumatoid arthritis with rheumatoid factor of right ankle and foot without organ or systems involvement: Secondary | ICD-10-CM

## 2023-09-01 DIAGNOSIS — M19072 Primary osteoarthritis, left ankle and foot: Secondary | ICD-10-CM

## 2023-09-01 DIAGNOSIS — M05772 Rheumatoid arthritis with rheumatoid factor of left ankle and foot without organ or systems involvement: Secondary | ICD-10-CM

## 2023-09-01 DIAGNOSIS — M19071 Primary osteoarthritis, right ankle and foot: Secondary | ICD-10-CM

## 2023-09-01 NOTE — Progress Notes (Signed)
   Chief Complaint  Patient presents with   Routine Post Op    RM# 8 POV bilateral feet arthritis .    Subjective:  Patient presents today status post ankle arthroscopy bilateral.  DOS: 08/26/2023.  Patient doing well.  WBAT in surgical shoes but today he presents wearing tennis shoes.  He only took 1 pain medication postoperatively.  No new complaints  Past Medical History:  Diagnosis Date   Anemia    B 12 DEFICIENCY   Arthritis    Back pain    Diabetes mellitus without complication (HCC)    Hypertension    Prostate enlargement    Sleep apnea    USES C-PAP    Past Surgical History:  Procedure Laterality Date   APPENDECTOMY     BACK SURGERY     GOLD SEED IMPLANT N/A 06/24/2021   Procedure: GOLD SEED IMPLANT;  Surgeon: Sherlyn Ditto, MD;  Location: [facility];  Service: Urology;  Laterality: N/A;   REFRACTIVE SURGERY  2008   bilateral eyes   ROTATOR CUFF REPAIR     RT   SPACE OAR INSTILLATION N/A 06/24/2021   Procedure: SPACE OAR INSTILLATION;  Surgeon: Sherlyn Ditto, MD;  Location: [facility];  Service: Urology;  Laterality: N/A;   TOTAL KNEE ARTHROPLASTY  02/29/2012   Procedure: TOTAL KNEE ARTHROPLASTY;  Surgeon: Aurther Blue, MD;  [facility];  Service: Orthopedics;  Laterality: Left;   TOTAL KNEE ARTHROPLASTY Right 09/26/2012   Procedure: RIGHT TOTAL KNEE ARTHROPLASTY;  Surgeon: Aurther Blue, MD;  Location: [facility];  Service: Orthopedics;  Laterality: Right;    No Known Allergies  Objective/Physical Exam Neurovascular status intact.  Incisions to the anterior aspect of the ankle are well coapted with sutures intact.  Minimal edema noted to the left ankle.  The right ankle does demonstrate some moderate edema with ecchymosis.  Clinically no concern for infection  Radiographic Exam B/L ankles 09/01/2023:  Unchanged.  Normal osseous mineralization.  Joint spaces preserved.  No acute fractures identified.  Assessment: 1. s/p  ankle arthroscopy bilateral. DOS: 08/26/2023   Plan of Care:  -Patient was evaluated. X-rays reviewed - Patient may begin washing and showering and getting the foot wet.  Recommend triple antibiotic and a Band-Aid over the incision sites -Compression ankle sleeve dispensed.  Wear daily with good supportive tennis shoes -RICE -Return to clinic 1 week suture removal   Dot Gazella, DPM [facility]  Dr. Dot Gazella, DPM    [address] [place], [place] [address]                Office [phone]  Fax [phone]

## 2023-09-06 DIAGNOSIS — I700 Atherosclerosis of aorta: Secondary | ICD-10-CM | POA: Diagnosis not present

## 2023-09-06 DIAGNOSIS — E119 Type 2 diabetes mellitus without complications: Secondary | ICD-10-CM | POA: Diagnosis not present

## 2023-09-06 DIAGNOSIS — I10 Essential (primary) hypertension: Secondary | ICD-10-CM | POA: Diagnosis not present

## 2023-09-06 DIAGNOSIS — N182 Chronic kidney disease, stage 2 (mild): Secondary | ICD-10-CM | POA: Diagnosis not present

## 2023-09-08 ENCOUNTER — Ambulatory Visit (INDEPENDENT_AMBULATORY_CARE_PROVIDER_SITE_OTHER): Admitting: Podiatry

## 2023-09-08 ENCOUNTER — Encounter: Payer: Self-pay | Admitting: Podiatry

## 2023-09-08 DIAGNOSIS — R399 Unspecified symptoms and signs involving the genitourinary system: Secondary | ICD-10-CM | POA: Diagnosis not present

## 2023-09-08 DIAGNOSIS — Z87440 Personal history of urinary (tract) infections: Secondary | ICD-10-CM | POA: Diagnosis not present

## 2023-09-08 DIAGNOSIS — M19071 Primary osteoarthritis, right ankle and foot: Secondary | ICD-10-CM

## 2023-09-08 NOTE — Progress Notes (Signed)
   Chief Complaint  Patient presents with   Routine Post Op    Post op bilateral ankles. 6 pain NIDDM A1C 7.6. Suture removal today.    Subjective:  Patient presents today status post ankle arthroscopy bilateral.  DOS: 08/26/2023.  Continues to do well.  WBAT tennis shoes.  He states that he has developed some heel pain to the lateral aspect of the right foot.  Overall though doing well  Past Medical History:  Diagnosis Date   Anemia    B 12 DEFICIENCY   Arthritis    Back pain    Diabetes mellitus without complication (HCC)    Hypertension    Prostate enlargement    Sleep apnea    USES C-PAP    Past Surgical History:  Procedure Laterality Date   APPENDECTOMY     BACK SURGERY     GOLD SEED IMPLANT N/A 06/24/2021   Procedure: GOLD SEED IMPLANT;  Surgeon: Sherlyn Ditto, MD;  Location: [facility];  Service: Urology;  Laterality: N/A;   REFRACTIVE SURGERY  2008   bilateral eyes   ROTATOR CUFF REPAIR     RT   SPACE OAR INSTILLATION N/A 06/24/2021   Procedure: SPACE OAR INSTILLATION;  Surgeon: Sherlyn Ditto, MD;  Location: [facility];  Service: Urology;  Laterality: N/A;   TOTAL KNEE ARTHROPLASTY  02/29/2012   Procedure: TOTAL KNEE ARTHROPLASTY;  Surgeon: Aurther Blue, MD;  Location[facility];  Service: Orthopedics;  Laterality: Left;   TOTAL KNEE ARTHROPLASTY Right 09/26/2012   Procedure: RIGHT TOTAL KNEE ARTHROPLASTY;  Surgeon: Aurther Blue, MD;  Location: [facility];  Service: Orthopedics;  Laterality: Right;    No Known Allergies  Objective/Physical Exam Neurovascular status intact.  Incisions to the anterior aspect of the ankle are well coapted with sutures intact.  Minimal edema noted to the left ankle.  The right ankle does demonstrate some moderate edema with ecchymosis.  Clinically no concern for infection  There are some slight tenderness to palpation to the plantar lateral aspect of the right heel.  Radiographic Exam B/L ankles  09/01/2023:  Unchanged.  Normal osseous mineralization.  Joint spaces preserved.  No acute fractures identified.  Assessment: 1. s/p ankle arthroscopy bilateral. DOS: 08/26/2023 2.  Right lateral heel pain  Plan of Care:  -Patient was evaluated -Sutures removed -For now we will simply continue to observe the heel pain to see if it resolves -Continue compression ankle sleeves -Continue good supportive tennis shoes -Return to clinic 2 weeks   Dot Gazella, DPM [facility]  Dr. Dot Gazella, DPM    [address] [place], [place] [address]                Office [phone]  Fax [phone]

## 2023-09-18 DIAGNOSIS — C61 Malignant neoplasm of prostate: Secondary | ICD-10-CM | POA: Diagnosis not present

## 2023-09-18 DIAGNOSIS — I10 Essential (primary) hypertension: Secondary | ICD-10-CM | POA: Diagnosis not present

## 2023-09-18 DIAGNOSIS — I700 Atherosclerosis of aorta: Secondary | ICD-10-CM | POA: Diagnosis not present

## 2023-09-18 DIAGNOSIS — E119 Type 2 diabetes mellitus without complications: Secondary | ICD-10-CM | POA: Diagnosis not present

## 2023-09-18 DIAGNOSIS — N182 Chronic kidney disease, stage 2 (mild): Secondary | ICD-10-CM | POA: Diagnosis not present

## 2023-09-18 DIAGNOSIS — E7800 Pure hypercholesterolemia, unspecified: Secondary | ICD-10-CM | POA: Diagnosis not present

## 2023-09-22 ENCOUNTER — Encounter: Payer: Self-pay | Admitting: Podiatry

## 2023-09-22 ENCOUNTER — Ambulatory Visit (INDEPENDENT_AMBULATORY_CARE_PROVIDER_SITE_OTHER): Admitting: Podiatry

## 2023-09-22 VITALS — Ht 72.0 in | Wt 309.5 lb

## 2023-09-22 DIAGNOSIS — E538 Deficiency of other specified B group vitamins: Secondary | ICD-10-CM | POA: Diagnosis not present

## 2023-09-22 DIAGNOSIS — M19071 Primary osteoarthritis, right ankle and foot: Secondary | ICD-10-CM

## 2023-09-22 NOTE — Progress Notes (Signed)
   Chief Complaint  Patient presents with   Routine Post Op    Patient presents today status post ankle arthroscopy bilateral.  DOS: 08/26/2023.    Subjective:  Patient presents today status post ankle arthroscopy bilateral.  DOS: 08/26/2023.  Doing well.  WBAT in tennis shoes.  No new complaints  Past Medical History:  Diagnosis Date   Anemia    B 12 DEFICIENCY   Arthritis    Back pain    Diabetes mellitus without complication (HCC)    Hypertension    Prostate enlargement    Sleep apnea    USES C-PAP    Past Surgical History:  Procedure Laterality Date   APPENDECTOMY     BACK SURGERY     GOLD SEED IMPLANT N/A 06/24/2021   Procedure: GOLD SEED IMPLANT;  Surgeon: Sherlyn Ditto, MD;  Location: [facility];  Service: Urology;  Laterality: N/A;   REFRACTIVE SURGERY  2008   bilateral eyes   ROTATOR CUFF REPAIR     RT   SPACE OAR INSTILLATION N/A 06/24/2021   Procedure: SPACE OAR INSTILLATION;  Surgeon: Sherlyn Ditto, MD;  Location: [facility];  Service: Urology;  Laterality: N/A;   TOTAL KNEE ARTHROPLASTY  02/29/2012   Procedure: TOTAL KNEE ARTHROPLASTY;  Surgeon: Aurther Blue, MD;  Location[facility];  Service: Orthopedics;  Laterality: Left;   TOTAL KNEE ARTHROPLASTY Right 09/26/2012   Procedure: RIGHT TOTAL KNEE ARTHROPLASTY;  Surgeon: Aurther Blue, MD;  Location: [facility];  Service: Orthopedics;  Laterality: Right;    No Known Allergies  Objective/Physical Exam Neurovascular status intact.  Incisions nicely healed.  There continues to be chronic edema to the right ankle.  Today there is no tenderness with palpation or range of motion of the tibiotalar joint bilateral  There are some slight tenderness to palpation to the plantar lateral aspect of the right heel.  Radiographic Exam B/L ankles 09/01/2023:  Unchanged.  Normal osseous mineralization.  Joint spaces preserved.  No acute fractures identified.  Assessment: 1. s/p ankle  arthroscopy bilateral. DOS: 08/26/2023   Plan of Care:  -Patient was evaluated - Continue compression ankle sleeves bilateral -Continue good supportive tennis shoes -Patient may now resume full activity no restrictions -Return to clinic 3 months follow-up   Dot Gazella, DPM [facility]  Dr. Dot Gazella, DPM    [address] [place], [place] [address]                Office [phone]  Fax [phone]

## 2023-10-06 DIAGNOSIS — E119 Type 2 diabetes mellitus without complications: Secondary | ICD-10-CM | POA: Diagnosis not present

## 2023-10-06 DIAGNOSIS — N182 Chronic kidney disease, stage 2 (mild): Secondary | ICD-10-CM | POA: Diagnosis not present

## 2023-10-06 DIAGNOSIS — I700 Atherosclerosis of aorta: Secondary | ICD-10-CM | POA: Diagnosis not present

## 2023-10-06 DIAGNOSIS — I10 Essential (primary) hypertension: Secondary | ICD-10-CM | POA: Diagnosis not present

## 2023-10-12 DIAGNOSIS — I10 Essential (primary) hypertension: Secondary | ICD-10-CM | POA: Diagnosis not present

## 2023-10-12 DIAGNOSIS — E1140 Type 2 diabetes mellitus with diabetic neuropathy, unspecified: Secondary | ICD-10-CM | POA: Diagnosis not present

## 2023-10-12 DIAGNOSIS — M79671 Pain in right foot: Secondary | ICD-10-CM | POA: Diagnosis not present

## 2023-10-18 ENCOUNTER — Ambulatory Visit (INDEPENDENT_AMBULATORY_CARE_PROVIDER_SITE_OTHER)

## 2023-10-18 ENCOUNTER — Ambulatory Visit (INDEPENDENT_AMBULATORY_CARE_PROVIDER_SITE_OTHER): Admitting: Podiatry

## 2023-10-18 ENCOUNTER — Encounter: Payer: Self-pay | Admitting: Podiatry

## 2023-10-18 VITALS — Ht 72.0 in | Wt 309.5 lb

## 2023-10-18 DIAGNOSIS — M19071 Primary osteoarthritis, right ankle and foot: Secondary | ICD-10-CM

## 2023-10-18 DIAGNOSIS — M7751 Other enthesopathy of right foot: Secondary | ICD-10-CM | POA: Diagnosis not present

## 2023-10-18 NOTE — Progress Notes (Signed)
   Chief Complaint  Patient presents with   Foot Pain    Pt is here due to right foot pain states the pain is off and on shooting pain to the whole foot.    Subjective:  Patient presents today status post ankle arthroscopy bilateral.  DOS: 08/26/2023.  Currently full activity in tennis shoes.  He does have some slight tenderness to the right foot and ankle intermittently.  Overall doing well however  Past Medical History:  Diagnosis Date   Anemia    B 12 DEFICIENCY   Arthritis    Back pain    Diabetes mellitus without complication (HCC)    Hypertension    Prostate enlargement    Sleep apnea    USES C-PAP    Past Surgical History:  Procedure Laterality Date   APPENDECTOMY     BACK SURGERY     GOLD SEED IMPLANT N/A 06/24/2021   Procedure: GOLD SEED IMPLANT;  Surgeon: Rosalind Zachary NOVAK, MD;  Location: [facility];  Service: Urology;  Laterality: N/A;   REFRACTIVE SURGERY  2008   bilateral eyes   ROTATOR CUFF REPAIR     RT   SPACE OAR INSTILLATION N/A 06/24/2021   Procedure: SPACE OAR INSTILLATION;  Surgeon: Rosalind Zachary NOVAK, MD;  Location: [facility];  Service: Urology;  Laterality: N/A;   TOTAL KNEE ARTHROPLASTY  02/29/2012   Procedure: TOTAL KNEE ARTHROPLASTY;  Surgeon: Dempsey LULLA Moan, MD;  [facility];  Service: Orthopedics;  Laterality: Left;   TOTAL KNEE ARTHROPLASTY Right 09/26/2012   Procedure: RIGHT TOTAL KNEE ARTHROPLASTY;  Surgeon: Dempsey LULLA Moan, MD;  Location: [facility];  Service: Orthopedics;  Laterality: Right;    No Known Allergies  Objective/Physical Exam Neurovascular status intact.  Incisions healed.  Mild chronic edema right ankle.  There is limited range of motion as well to the tibiotalar joint.  Currently no appreciable tenderness with palpation  Radiographic Exam B/L ankles 09/01/2023:  Unchanged.  Normal osseous mineralization.  Joint spaces preserved.  No acute fractures identified.  Assessment: 1. s/p ankle  arthroscopy bilateral. DOS: 08/26/2023   Plan of Care:  -Patient was evaluated - Full activity no restrictions in tennis shoes -He recently joined a fitness gym and enrolled in a nutritionist.  Physical therapy was offered however for the moment he will work with his fitness instructor -Return to clinic PRN  Thresa EMERSON Sar, DPM [facility]  Dr. Thresa EMERSON Sar, DPM    [address] [place], [place] [address]                Office [phone]  Fax [phone]

## 2023-10-26 DIAGNOSIS — E538 Deficiency of other specified B group vitamins: Secondary | ICD-10-CM | POA: Diagnosis not present

## 2023-10-27 DIAGNOSIS — M25512 Pain in left shoulder: Secondary | ICD-10-CM | POA: Diagnosis not present

## 2023-10-27 DIAGNOSIS — M25511 Pain in right shoulder: Secondary | ICD-10-CM | POA: Diagnosis not present

## 2023-11-05 DIAGNOSIS — I10 Essential (primary) hypertension: Secondary | ICD-10-CM | POA: Diagnosis not present

## 2023-11-05 DIAGNOSIS — I700 Atherosclerosis of aorta: Secondary | ICD-10-CM | POA: Diagnosis not present

## 2023-11-05 DIAGNOSIS — E119 Type 2 diabetes mellitus without complications: Secondary | ICD-10-CM | POA: Diagnosis not present

## 2023-11-05 DIAGNOSIS — N182 Chronic kidney disease, stage 2 (mild): Secondary | ICD-10-CM | POA: Diagnosis not present

## 2023-11-07 ENCOUNTER — Other Ambulatory Visit: Payer: Self-pay | Admitting: Podiatry

## 2023-11-16 ENCOUNTER — Other Ambulatory Visit (HOSPITAL_COMMUNITY): Payer: Self-pay | Admitting: Internal Medicine

## 2023-11-16 DIAGNOSIS — E538 Deficiency of other specified B group vitamins: Secondary | ICD-10-CM | POA: Diagnosis not present

## 2023-11-16 DIAGNOSIS — I10 Essential (primary) hypertension: Secondary | ICD-10-CM | POA: Diagnosis not present

## 2023-11-16 DIAGNOSIS — Z0000 Encounter for general adult medical examination without abnormal findings: Secondary | ICD-10-CM | POA: Diagnosis not present

## 2023-11-16 DIAGNOSIS — M109 Gout, unspecified: Secondary | ICD-10-CM | POA: Diagnosis not present

## 2023-11-16 DIAGNOSIS — E7800 Pure hypercholesterolemia, unspecified: Secondary | ICD-10-CM | POA: Diagnosis not present

## 2023-11-16 DIAGNOSIS — Z1389 Encounter for screening for other disorder: Secondary | ICD-10-CM | POA: Diagnosis not present

## 2023-11-16 DIAGNOSIS — D696 Thrombocytopenia, unspecified: Secondary | ICD-10-CM | POA: Diagnosis not present

## 2023-11-16 DIAGNOSIS — N182 Chronic kidney disease, stage 2 (mild): Secondary | ICD-10-CM | POA: Diagnosis not present

## 2023-11-16 DIAGNOSIS — I700 Atherosclerosis of aorta: Secondary | ICD-10-CM | POA: Diagnosis not present

## 2023-11-16 DIAGNOSIS — R011 Cardiac murmur, unspecified: Secondary | ICD-10-CM

## 2023-11-16 DIAGNOSIS — E1140 Type 2 diabetes mellitus with diabetic neuropathy, unspecified: Secondary | ICD-10-CM | POA: Diagnosis not present

## 2023-11-16 DIAGNOSIS — K7581 Nonalcoholic steatohepatitis (NASH): Secondary | ICD-10-CM | POA: Diagnosis not present

## 2023-11-16 DIAGNOSIS — Z23 Encounter for immunization: Secondary | ICD-10-CM | POA: Diagnosis not present

## 2023-11-18 DIAGNOSIS — I10 Essential (primary) hypertension: Secondary | ICD-10-CM | POA: Diagnosis not present

## 2023-11-18 DIAGNOSIS — E119 Type 2 diabetes mellitus without complications: Secondary | ICD-10-CM | POA: Diagnosis not present

## 2023-11-18 DIAGNOSIS — N182 Chronic kidney disease, stage 2 (mild): Secondary | ICD-10-CM | POA: Diagnosis not present

## 2023-11-18 DIAGNOSIS — C61 Malignant neoplasm of prostate: Secondary | ICD-10-CM | POA: Diagnosis not present

## 2023-11-18 DIAGNOSIS — I700 Atherosclerosis of aorta: Secondary | ICD-10-CM | POA: Diagnosis not present

## 2023-11-18 DIAGNOSIS — E7800 Pure hypercholesterolemia, unspecified: Secondary | ICD-10-CM | POA: Diagnosis not present

## 2023-11-19 ENCOUNTER — Ambulatory Visit (HOSPITAL_COMMUNITY)
Admission: RE | Admit: 2023-11-19 | Discharge: 2023-11-19 | Disposition: A | Discharge status: Home / Self Care | Source: Ambulatory Visit | Attending: Internal Medicine | Admitting: Internal Medicine

## 2023-11-19 DIAGNOSIS — R011 Cardiac murmur, unspecified: Secondary | ICD-10-CM | POA: Insufficient documentation

## 2023-11-19 LAB — ECHOCARDIOGRAM COMPLETE: S' Lateral: 3.4 cm

## 2023-11-19 MED ORDER — PERFLUTREN LIPID MICROSPHERE
1.0000 mL | INTRAVENOUS | Status: AC | PRN
  Administered 2023-11-19: 2 mL via INTRAVENOUS

## 2023-12-05 DIAGNOSIS — I700 Atherosclerosis of aorta: Secondary | ICD-10-CM | POA: Diagnosis not present

## 2023-12-05 DIAGNOSIS — N182 Chronic kidney disease, stage 2 (mild): Secondary | ICD-10-CM | POA: Diagnosis not present

## 2023-12-05 DIAGNOSIS — I10 Essential (primary) hypertension: Secondary | ICD-10-CM | POA: Diagnosis not present

## 2023-12-05 DIAGNOSIS — E119 Type 2 diabetes mellitus without complications: Secondary | ICD-10-CM | POA: Diagnosis not present

## 2023-12-19 DIAGNOSIS — C61 Malignant neoplasm of prostate: Secondary | ICD-10-CM | POA: Diagnosis not present

## 2023-12-19 DIAGNOSIS — E119 Type 2 diabetes mellitus without complications: Secondary | ICD-10-CM | POA: Diagnosis not present

## 2023-12-19 DIAGNOSIS — E7800 Pure hypercholesterolemia, unspecified: Secondary | ICD-10-CM | POA: Diagnosis not present

## 2023-12-19 DIAGNOSIS — N182 Chronic kidney disease, stage 2 (mild): Secondary | ICD-10-CM | POA: Diagnosis not present

## 2023-12-29 ENCOUNTER — Ambulatory Visit (INDEPENDENT_AMBULATORY_CARE_PROVIDER_SITE_OTHER): Admitting: Podiatry

## 2023-12-29 DIAGNOSIS — M7072 Other bursitis of hip, left hip: Secondary | ICD-10-CM

## 2023-12-29 NOTE — Progress Notes (Signed)
   Chief Complaint  Patient presents with   DJD (degenerative joint disease), ankle and foot, right    Pt stated that he is here for a follow up he stated that he still has some slight discomfort but he feels like it is coming from his back because he has a lot of pain in his hips and back     Subjective:  Patient presents today status post ankle arthroscopy bilateral.  DOS: 08/26/2023.  Currently full activity in tennis shoes.  Patient has been experiencing significant amount of pain and tenderness to the left hip extending down to the thigh.  Requesting a referral to orthopedics Dr. Donnice Car at [facility]  Past Medical History:  Diagnosis Date   Anemia    B 12 DEFICIENCY   Arthritis    Back pain    Diabetes mellitus without complication (HCC)    Hypertension    Prostate enlargement    Sleep apnea    USES C-PAP    Past Surgical History:  Procedure Laterality Date   APPENDECTOMY     BACK SURGERY     GOLD SEED IMPLANT N/A 06/24/2021   Procedure: GOLD SEED IMPLANT;  Surgeon: Rosalind Zachary NOVAK, MD;  Location: [facility];  Service: Urology;  Laterality: N/A;   REFRACTIVE SURGERY  2008   bilateral eyes   ROTATOR CUFF REPAIR     RT   SPACE OAR INSTILLATION N/A 06/24/2021   Procedure: SPACE OAR INSTILLATION;  Surgeon: Rosalind Zachary NOVAK, MD;  Location: [place];  Service: Urology;  Laterality: N/A;   TOTAL KNEE ARTHROPLASTY  02/29/2012   Procedure: TOTAL KNEE ARTHROPLASTY;  Surgeon: Dempsey LULLA Moan, MD;  Location[facility];  Service: Orthopedics;  Laterality: Left;   TOTAL KNEE ARTHROPLASTY Right 09/26/2012   Procedure: RIGHT TOTAL KNEE ARTHROPLASTY;  Surgeon: Dempsey LULLA Moan, MD;  Location: [facility];  Service: Orthopedics;  Laterality: Right;    No Known Allergies  Objective/Physical Exam Neurovascular status intact.  Incisions healed.  chronic edema right ankle.  There is limited range of motion as well to the tibiotalar joint.  Currently no  appreciable tenderness with palpation  Radiographic Exam B/L ankles 09/01/2023:  Unchanged.  Normal osseous mineralization.  Joint spaces preserved.  No acute fractures identified.  Assessment: 1. s/p ankle arthroscopy bilateral. DOS: 08/26/2023 2.  Left hip bursitis  Plan of Care:  -Patient was evaluated -Continue no restrictions with full activity and good supportive tennis shoes and sneakers regarding the ankles -Referral placed to [facility] orthopedics for left hip pain, Dr. Donnice Car -Return to clinic PRN  Thresa EMERSON Sar, DPM [facility]  Dr. Thresa EMERSON Sar, DPM    [address] [place], [place] [address]                Office [phone]  Fax [phone]

## 2024-01-04 DIAGNOSIS — I10 Essential (primary) hypertension: Secondary | ICD-10-CM | POA: Diagnosis not present

## 2024-01-04 DIAGNOSIS — E119 Type 2 diabetes mellitus without complications: Secondary | ICD-10-CM | POA: Diagnosis not present

## 2024-01-04 DIAGNOSIS — I700 Atherosclerosis of aorta: Secondary | ICD-10-CM | POA: Diagnosis not present

## 2024-01-04 DIAGNOSIS — N182 Chronic kidney disease, stage 2 (mild): Secondary | ICD-10-CM | POA: Diagnosis not present

## 2024-01-06 ENCOUNTER — Other Ambulatory Visit: Payer: Self-pay | Admitting: Podiatry

## 2024-01-10 DIAGNOSIS — Z6841 Body Mass Index (BMI) 40.0 and over, adult: Secondary | ICD-10-CM | POA: Diagnosis not present

## 2024-01-10 DIAGNOSIS — M25552 Pain in left hip: Secondary | ICD-10-CM | POA: Diagnosis not present

## 2024-01-18 DIAGNOSIS — E7800 Pure hypercholesterolemia, unspecified: Secondary | ICD-10-CM | POA: Diagnosis not present

## 2024-01-18 DIAGNOSIS — N182 Chronic kidney disease, stage 2 (mild): Secondary | ICD-10-CM | POA: Diagnosis not present

## 2024-01-18 DIAGNOSIS — E119 Type 2 diabetes mellitus without complications: Secondary | ICD-10-CM | POA: Diagnosis not present

## 2024-01-18 DIAGNOSIS — C61 Malignant neoplasm of prostate: Secondary | ICD-10-CM | POA: Diagnosis not present

## 2024-01-26 DIAGNOSIS — K219 Gastro-esophageal reflux disease without esophagitis: Secondary | ICD-10-CM | POA: Diagnosis not present

## 2024-01-26 DIAGNOSIS — E66813 Obesity, class 3: Secondary | ICD-10-CM | POA: Diagnosis not present

## 2024-01-26 DIAGNOSIS — I10 Essential (primary) hypertension: Secondary | ICD-10-CM | POA: Diagnosis not present

## 2024-01-26 DIAGNOSIS — M109 Gout, unspecified: Secondary | ICD-10-CM | POA: Diagnosis not present

## 2024-01-26 DIAGNOSIS — E119 Type 2 diabetes mellitus without complications: Secondary | ICD-10-CM | POA: Diagnosis not present

## 2024-01-26 DIAGNOSIS — K760 Fatty (change of) liver, not elsewhere classified: Secondary | ICD-10-CM | POA: Diagnosis not present

## 2024-01-26 DIAGNOSIS — G4733 Obstructive sleep apnea (adult) (pediatric): Secondary | ICD-10-CM | POA: Diagnosis not present

## 2024-01-26 DIAGNOSIS — D649 Anemia, unspecified: Secondary | ICD-10-CM | POA: Diagnosis not present

## 2024-01-26 DIAGNOSIS — E559 Vitamin D deficiency, unspecified: Secondary | ICD-10-CM | POA: Diagnosis not present

## 2024-01-26 DIAGNOSIS — Z1331 Encounter for screening for depression: Secondary | ICD-10-CM | POA: Diagnosis not present

## 2024-01-26 DIAGNOSIS — Z6841 Body Mass Index (BMI) 40.0 and over, adult: Secondary | ICD-10-CM | POA: Diagnosis not present

## 2024-01-26 DIAGNOSIS — E538 Deficiency of other specified B group vitamins: Secondary | ICD-10-CM | POA: Diagnosis not present

## 2024-02-03 DIAGNOSIS — N182 Chronic kidney disease, stage 2 (mild): Secondary | ICD-10-CM | POA: Diagnosis not present

## 2024-02-03 DIAGNOSIS — I700 Atherosclerosis of aorta: Secondary | ICD-10-CM | POA: Diagnosis not present

## 2024-02-03 DIAGNOSIS — E119 Type 2 diabetes mellitus without complications: Secondary | ICD-10-CM | POA: Diagnosis not present

## 2024-02-03 DIAGNOSIS — I10 Essential (primary) hypertension: Secondary | ICD-10-CM | POA: Diagnosis not present

## 2024-02-07 DIAGNOSIS — I10 Essential (primary) hypertension: Secondary | ICD-10-CM | POA: Diagnosis not present

## 2024-02-07 DIAGNOSIS — E559 Vitamin D deficiency, unspecified: Secondary | ICD-10-CM | POA: Diagnosis not present

## 2024-02-07 DIAGNOSIS — E119 Type 2 diabetes mellitus without complications: Secondary | ICD-10-CM | POA: Diagnosis not present

## 2024-02-07 DIAGNOSIS — D649 Anemia, unspecified: Secondary | ICD-10-CM | POA: Diagnosis not present

## 2024-02-07 DIAGNOSIS — Z6841 Body Mass Index (BMI) 40.0 and over, adult: Secondary | ICD-10-CM | POA: Diagnosis not present

## 2024-02-07 DIAGNOSIS — M109 Gout, unspecified: Secondary | ICD-10-CM | POA: Diagnosis not present

## 2024-02-07 DIAGNOSIS — E66813 Obesity, class 3: Secondary | ICD-10-CM | POA: Diagnosis not present

## 2024-02-07 DIAGNOSIS — K219 Gastro-esophageal reflux disease without esophagitis: Secondary | ICD-10-CM | POA: Diagnosis not present

## 2024-02-07 DIAGNOSIS — K760 Fatty (change of) liver, not elsewhere classified: Secondary | ICD-10-CM | POA: Diagnosis not present

## 2024-02-07 DIAGNOSIS — E538 Deficiency of other specified B group vitamins: Secondary | ICD-10-CM | POA: Diagnosis not present

## 2024-02-07 DIAGNOSIS — G4733 Obstructive sleep apnea (adult) (pediatric): Secondary | ICD-10-CM | POA: Diagnosis not present

## 2024-02-17 DIAGNOSIS — G5603 Carpal tunnel syndrome, bilateral upper limbs: Secondary | ICD-10-CM | POA: Diagnosis not present

## 2024-02-18 DIAGNOSIS — E119 Type 2 diabetes mellitus without complications: Secondary | ICD-10-CM | POA: Diagnosis not present

## 2024-02-18 DIAGNOSIS — E7800 Pure hypercholesterolemia, unspecified: Secondary | ICD-10-CM | POA: Diagnosis not present

## 2024-02-18 DIAGNOSIS — N182 Chronic kidney disease, stage 2 (mild): Secondary | ICD-10-CM | POA: Diagnosis not present

## 2024-02-18 DIAGNOSIS — C61 Malignant neoplasm of prostate: Secondary | ICD-10-CM | POA: Diagnosis not present

## 2024-02-22 DIAGNOSIS — Z6841 Body Mass Index (BMI) 40.0 and over, adult: Secondary | ICD-10-CM | POA: Diagnosis not present

## 2024-02-22 DIAGNOSIS — D649 Anemia, unspecified: Secondary | ICD-10-CM | POA: Diagnosis not present

## 2024-02-22 DIAGNOSIS — E538 Deficiency of other specified B group vitamins: Secondary | ICD-10-CM | POA: Diagnosis not present

## 2024-02-22 DIAGNOSIS — K219 Gastro-esophageal reflux disease without esophagitis: Secondary | ICD-10-CM | POA: Diagnosis not present

## 2024-02-22 DIAGNOSIS — K760 Fatty (change of) liver, not elsewhere classified: Secondary | ICD-10-CM | POA: Diagnosis not present

## 2024-02-22 DIAGNOSIS — E559 Vitamin D deficiency, unspecified: Secondary | ICD-10-CM | POA: Diagnosis not present

## 2024-02-22 DIAGNOSIS — E66813 Obesity, class 3: Secondary | ICD-10-CM | POA: Diagnosis not present

## 2024-02-22 DIAGNOSIS — M109 Gout, unspecified: Secondary | ICD-10-CM | POA: Diagnosis not present

## 2024-02-22 DIAGNOSIS — I10 Essential (primary) hypertension: Secondary | ICD-10-CM | POA: Diagnosis not present

## 2024-02-22 DIAGNOSIS — E119 Type 2 diabetes mellitus without complications: Secondary | ICD-10-CM | POA: Diagnosis not present

## 2024-02-22 DIAGNOSIS — G4733 Obstructive sleep apnea (adult) (pediatric): Secondary | ICD-10-CM | POA: Diagnosis not present

## 2024-03-04 ENCOUNTER — Other Ambulatory Visit: Payer: Self-pay | Admitting: Podiatry

## 2024-03-04 DIAGNOSIS — I700 Atherosclerosis of aorta: Secondary | ICD-10-CM | POA: Diagnosis not present

## 2024-03-04 DIAGNOSIS — N182 Chronic kidney disease, stage 2 (mild): Secondary | ICD-10-CM | POA: Diagnosis not present

## 2024-03-04 DIAGNOSIS — I10 Essential (primary) hypertension: Secondary | ICD-10-CM | POA: Diagnosis not present

## 2024-03-04 DIAGNOSIS — E119 Type 2 diabetes mellitus without complications: Secondary | ICD-10-CM | POA: Diagnosis not present

## 2024-03-07 DIAGNOSIS — E119 Type 2 diabetes mellitus without complications: Secondary | ICD-10-CM | POA: Diagnosis not present

## 2024-03-07 DIAGNOSIS — M109 Gout, unspecified: Secondary | ICD-10-CM | POA: Diagnosis not present

## 2024-03-07 DIAGNOSIS — K219 Gastro-esophageal reflux disease without esophagitis: Secondary | ICD-10-CM | POA: Diagnosis not present

## 2024-03-07 DIAGNOSIS — Z6841 Body Mass Index (BMI) 40.0 and over, adult: Secondary | ICD-10-CM | POA: Diagnosis not present

## 2024-03-07 DIAGNOSIS — E66813 Obesity, class 3: Secondary | ICD-10-CM | POA: Diagnosis not present

## 2024-03-07 DIAGNOSIS — E538 Deficiency of other specified B group vitamins: Secondary | ICD-10-CM | POA: Diagnosis not present

## 2024-03-07 DIAGNOSIS — I10 Essential (primary) hypertension: Secondary | ICD-10-CM | POA: Diagnosis not present

## 2024-03-07 DIAGNOSIS — G4733 Obstructive sleep apnea (adult) (pediatric): Secondary | ICD-10-CM | POA: Diagnosis not present

## 2024-03-07 DIAGNOSIS — E559 Vitamin D deficiency, unspecified: Secondary | ICD-10-CM | POA: Diagnosis not present

## 2024-03-27 DIAGNOSIS — M160 Bilateral primary osteoarthritis of hip: Secondary | ICD-10-CM | POA: Diagnosis not present

## 2024-03-27 DIAGNOSIS — Z96652 Presence of left artificial knee joint: Secondary | ICD-10-CM | POA: Diagnosis not present

## 2024-05-03 ENCOUNTER — Ambulatory Visit: Admitting: Podiatry

## 2024-05-03 DIAGNOSIS — M25571 Pain in right ankle and joints of right foot: Secondary | ICD-10-CM

## 2024-05-03 MED ORDER — TRIAMCINOLONE ACETONIDE 10 MG/ML IJ SUSP: 10.0000 mg | Freq: Once | INTRAMUSCULAR | Status: AC

## 2024-05-03 NOTE — Progress Notes (Signed)
 Patient presents complaining of pain in the right ankle.  Has been bothering her for the past several weeks more than usual.  Says a little different than the ankle pain he typically gets.  Physical exam:  General appearance: Pleasant, and in no acute distress. AOx3.  Vascular: Pedal pulses: DP 2/4 bilaterally, PT 2/4 bilaterally.  Moderate edema lower legs bilaterally. Capillary fill time immediate bilaterally.  Neurological: Light touch intact feet bilaterally.  Normal Achilles reflex bilaterally.  No clonus or spasticity noted.   Dermatologic:   Skin normal temperature bilaterally.  Skin normal color, tone, and texture bilaterally.   Musculoskeletal: Tenderness at the sinus tarsi and with range of motion subtalar joint right.  Mild soreness around the anterior ankle right.    Diagnosis: 1.  Arthralgia subtalar joint right   plan: -Discussed with the pain in the right foot.  Pain seems to be more in the subtalar joint and the ankle joint today.  He has had a problems with the ankle with injections and an arthroscope by Dr. Janit.  Will try an injection in the subtalar joint today. -injected 3cc 2:1 mixture 0.5 cc Marcaine : Triamcinolone  40mg /58ml at subtalar joint right at sinus tarsi.SABRA   Return 2 weeks follow-up injection STJ with Dr. Janit

## 2024-05-15 ENCOUNTER — Ambulatory Visit: Admitting: Podiatry

## 2024-05-17 ENCOUNTER — Ambulatory Visit: Admitting: Podiatry

## 2024-05-22 ENCOUNTER — Ambulatory Visit: Admitting: Podiatry

## 2024-05-29 ENCOUNTER — Ambulatory Visit: Admitting: Podiatry

## 2024-07-06 ENCOUNTER — Ambulatory Visit (HOSPITAL_COMMUNITY): Admit: 2024-07-06 | Admitting: Orthopedic Surgery

## 2024-07-06 DIAGNOSIS — M1612 Unilateral primary osteoarthritis, left hip: Secondary | ICD-10-CM

## 2024-07-06 SURGERY — ARTHROPLASTY, HIP, TOTAL, ANTERIOR APPROACH
Anesthesia: Spinal | Site: Hip | Laterality: Left
